# Patient Record
Sex: Female | Born: 1935 | Race: White | Hispanic: No | Smoking: Former smoker
Health system: Southern US, Community
[De-identification: ages and names within clinical notes are randomized; demographics above are authoritative.]

## PROBLEM LIST (undated history)

## (undated) DIAGNOSIS — E785 Hyperlipidemia, unspecified: Secondary | ICD-10-CM

## (undated) DIAGNOSIS — I471 Supraventricular tachycardia, unspecified: Secondary | ICD-10-CM

## (undated) DIAGNOSIS — K219 Gastro-esophageal reflux disease without esophagitis: Secondary | ICD-10-CM

## (undated) DIAGNOSIS — I10 Essential (primary) hypertension: Secondary | ICD-10-CM

## (undated) DIAGNOSIS — I499 Cardiac arrhythmia, unspecified: Secondary | ICD-10-CM

## (undated) DIAGNOSIS — I4891 Unspecified atrial fibrillation: Secondary | ICD-10-CM

## (undated) DIAGNOSIS — H269 Unspecified cataract: Secondary | ICD-10-CM

## (undated) DIAGNOSIS — M542 Cervicalgia: Secondary | ICD-10-CM

## (undated) DIAGNOSIS — T7840XA Allergy, unspecified, initial encounter: Secondary | ICD-10-CM

## (undated) DIAGNOSIS — I480 Paroxysmal atrial fibrillation: Secondary | ICD-10-CM

## (undated) DIAGNOSIS — I200 Unstable angina: Secondary | ICD-10-CM

## (undated) DIAGNOSIS — K222 Esophageal obstruction: Secondary | ICD-10-CM

## (undated) HISTORY — DX: Unspecified cataract: H26.9

## (undated) HISTORY — DX: Hyperlipidemia, unspecified: E78.5

## (undated) HISTORY — DX: Allergy, unspecified, initial encounter: T78.40XA

## (undated) HISTORY — DX: Essential (primary) hypertension: I10

## (undated) HISTORY — PX: APPENDECTOMY: SHX54

## (undated) HISTORY — DX: Esophageal obstruction: K22.2

## (undated) HISTORY — PX: ABDOMINAL HYSTERECTOMY: SHX81

## (undated) HISTORY — PX: BREAST EXCISIONAL BIOPSY: SUR124

## (undated) HISTORY — DX: Cervicalgia: M54.2

## (undated) HISTORY — DX: Supraventricular tachycardia: I47.1

## (undated) HISTORY — PX: BLADDER SURGERY: SHX569

## (undated) HISTORY — DX: Paroxysmal atrial fibrillation: I48.0

## (undated) HISTORY — DX: Supraventricular tachycardia, unspecified: I47.10

## (undated) HISTORY — DX: Unstable angina: I20.0

## (undated) HISTORY — DX: Unspecified atrial fibrillation: I48.91

---

## 1998-03-26 ENCOUNTER — Ambulatory Visit (HOSPITAL_COMMUNITY): Admission: RE | Admit: 1998-03-26 | Discharge: 1998-03-26 | Payer: Self-pay | Admitting: Infectious Diseases

## 1999-12-07 ENCOUNTER — Other Ambulatory Visit: Admission: RE | Admit: 1999-12-07 | Discharge: 1999-12-07 | Payer: Self-pay

## 2000-04-14 ENCOUNTER — Encounter: Payer: Self-pay | Admitting: Internal Medicine

## 2000-04-14 ENCOUNTER — Encounter: Admission: RE | Admit: 2000-04-14 | Discharge: 2000-04-14 | Payer: Self-pay | Admitting: Internal Medicine

## 2001-06-05 ENCOUNTER — Encounter: Payer: Self-pay | Admitting: Internal Medicine

## 2001-06-05 ENCOUNTER — Encounter: Admission: RE | Admit: 2001-06-05 | Discharge: 2001-06-05 | Payer: Self-pay | Admitting: Internal Medicine

## 2001-06-12 ENCOUNTER — Encounter: Admission: RE | Admit: 2001-06-12 | Discharge: 2001-06-12 | Payer: Self-pay | Admitting: Internal Medicine

## 2001-06-12 ENCOUNTER — Encounter: Payer: Self-pay | Admitting: Internal Medicine

## 2002-06-13 ENCOUNTER — Encounter: Payer: Self-pay | Admitting: Internal Medicine

## 2002-06-13 ENCOUNTER — Encounter: Admission: RE | Admit: 2002-06-13 | Discharge: 2002-06-13 | Payer: Self-pay | Admitting: Internal Medicine

## 2003-05-14 ENCOUNTER — Encounter: Admission: RE | Admit: 2003-05-14 | Discharge: 2003-05-14 | Payer: Self-pay | Admitting: General Surgery

## 2003-05-14 ENCOUNTER — Encounter: Payer: Self-pay | Admitting: General Surgery

## 2003-05-16 ENCOUNTER — Ambulatory Visit (HOSPITAL_BASED_OUTPATIENT_CLINIC_OR_DEPARTMENT_OTHER): Admission: RE | Admit: 2003-05-16 | Discharge: 2003-05-16 | Payer: Self-pay | Admitting: General Surgery

## 2003-05-16 ENCOUNTER — Encounter (INDEPENDENT_AMBULATORY_CARE_PROVIDER_SITE_OTHER): Payer: Self-pay | Admitting: *Deleted

## 2003-06-16 ENCOUNTER — Encounter: Admission: RE | Admit: 2003-06-16 | Discharge: 2003-06-16 | Payer: Self-pay | Admitting: Internal Medicine

## 2003-06-16 ENCOUNTER — Encounter: Payer: Self-pay | Admitting: Internal Medicine

## 2004-02-13 ENCOUNTER — Encounter: Admission: RE | Admit: 2004-02-13 | Discharge: 2004-02-13 | Payer: Self-pay | Admitting: Internal Medicine

## 2004-06-26 ENCOUNTER — Inpatient Hospital Stay (HOSPITAL_COMMUNITY): Admission: EM | Admit: 2004-06-26 | Discharge: 2004-06-30 | Payer: Self-pay | Admitting: Emergency Medicine

## 2004-06-28 ENCOUNTER — Encounter (INDEPENDENT_AMBULATORY_CARE_PROVIDER_SITE_OTHER): Payer: Self-pay | Admitting: *Deleted

## 2004-06-28 HISTORY — PX: CARDIAC CATHETERIZATION: SHX172

## 2004-07-26 ENCOUNTER — Encounter: Admission: RE | Admit: 2004-07-26 | Discharge: 2004-07-26 | Payer: Self-pay | Admitting: Internal Medicine

## 2004-10-26 ENCOUNTER — Ambulatory Visit: Payer: Self-pay | Admitting: Internal Medicine

## 2004-11-24 ENCOUNTER — Ambulatory Visit: Payer: Self-pay | Admitting: Internal Medicine

## 2004-12-06 ENCOUNTER — Ambulatory Visit: Payer: Self-pay | Admitting: Internal Medicine

## 2005-10-04 ENCOUNTER — Encounter: Admission: RE | Admit: 2005-10-04 | Discharge: 2005-10-04 | Payer: Self-pay | Admitting: Internal Medicine

## 2005-10-24 ENCOUNTER — Encounter: Admission: RE | Admit: 2005-10-24 | Discharge: 2005-10-24 | Payer: Self-pay | Admitting: Internal Medicine

## 2006-09-26 HISTORY — PX: CATARACT EXTRACTION, BILATERAL: SHX1313

## 2006-10-31 ENCOUNTER — Encounter: Admission: RE | Admit: 2006-10-31 | Discharge: 2006-10-31 | Payer: Self-pay | Admitting: Internal Medicine

## 2007-03-13 ENCOUNTER — Encounter: Admission: RE | Admit: 2007-03-13 | Discharge: 2007-03-13 | Payer: Self-pay | Admitting: Internal Medicine

## 2007-09-27 HISTORY — PX: TRANSTHORACIC ECHOCARDIOGRAM: SHX275

## 2007-11-12 ENCOUNTER — Encounter: Admission: RE | Admit: 2007-11-12 | Discharge: 2007-11-12 | Payer: Self-pay | Admitting: Internal Medicine

## 2009-11-10 ENCOUNTER — Encounter (INDEPENDENT_AMBULATORY_CARE_PROVIDER_SITE_OTHER): Payer: Self-pay | Admitting: *Deleted

## 2010-07-06 ENCOUNTER — Telehealth: Payer: Self-pay | Admitting: Internal Medicine

## 2010-10-17 ENCOUNTER — Encounter: Payer: Self-pay | Admitting: Internal Medicine

## 2010-10-26 NOTE — Letter (Signed)
Summary: Colonoscopy Letter  [facility]  [address] [place], [place] [address]   Phone: [phone]  Fax: [phone]      November 10, 2009 MRN: [id number]   [facility] [address] [place], [place]  [address]   Dear Ms. Rayle,   According to your medical record, it is time for you to schedule a Colonoscopy. The American Cancer Society recommends this procedure as a method to detect early colon cancer. Patients with a family history of colon cancer, or a personal history of colon polyps or inflammatory bowel disease are at increased risk.  This letter has beeen generated based on the recommendations made at the time of your procedure. If you feel that in your particular situation this may no longer apply, please contact our office.  Please call our office at [phone] to schedule this appointment or to update your records at your earliest convenience.  Thank you for cooperating with [place] to provide you with the very best care possible.   Sincerely,  Wilhemina Bonito. Marina Goodell, M.D.  [facility] Gastroenterology Division [phone]

## 2010-10-26 NOTE — Progress Notes (Signed)
Summary: Schedule Colonoscopy  Phone Note Outgoing Call Call back at Home Phone [phone]   Call placed by: Harlow Mares CMA Duncan Dull),  July 06, 2010 3:36 PM Call placed to: Patient Summary of Call: patient due for a recall colonoscopy, i have Left a message on patients machine to call back.  Initial call taken by: Harlow Mares CMA Duncan Dull),  July 06, 2010 3:36 PM  Follow-up for Phone Call        PT CALLED AND SAID SHE WILL C/B TO [facility] COL.Marland KitchenMarland KitchenNOT READY TO [facility] RIGHT NOW Follow-up by: Revonda Standard, [facility]

## 2011-02-11 NOTE — Discharge Summary (Signed)
NAMEKRISY, Laurie Pugh             ACCOUNT NO.:  [id number]   MEDICAL RECORD NO.:  [id number]          PATIENT TYPE:  INP   LOCATION:  4713                         FACILITY:  [facility]   PHYSICIAN:  Dani Gobble, MD       DATE OF BIRTH:  [birth year]   DATE OF ADMISSION:  06/26/2004  DATE OF DISCHARGE:  06/30/2004                                 DISCHARGE SUMMARY   ADMISSION DIAGNOSES:  1.  Supraventricular tachycardia/atrial fibrillation.  2.  Hyperlipidemia.  3.  Tobacco use.   DISCHARGE DIAGNOSES:  1.  Supraventricular tachycardia/atrial fibrillation.  2.  Hyperlipidemia.  3.  Tobacco use.   PROCEDURE:  Cardiac catheterization, June 28, 2004.   BRIEF HISTORY AND PHYSICAL:  The patient is a 77 year old white female, a  medical patient of Dr. Waynard Edwards with a history of tobacco use and a  hyperlipidemia, who was admitted on June 26, 2004 with atrial  fibrillation.  She presented to [facility] with SVT and atrial  fibrillation. She woke up around 5 a.m. the day prior felt a rapid heart  rate that lasted until 7:15 a.m.  It resolved apparently, and she had  another episode the a.m. of June 26, 2004.  She went to Urgent Care who  subsequently sent her to the emergency room at [facility].  She had  a similar episode of heart rate occurring approximately 6-8 months ago and  lasted approximately one hour.  Upon presentation to Urgent Care, she had  SVT with atrial fibrillation.  On arrival to the ED here at [facility], she  converted to a sinus rhythm with heart rate of 83.  She was ruled out for a  myocardial infarction.  She had no further palpitations, underwent cardiac  catheterization on June 28, 2004.  This showed normal coronaries.  There  was no disease.  She had an ejection fraction of 70%.  A 2-D echocardiogram  obtained on June 28, 2004 showed a normal LV systolic function with an EF  between 55 and 65%.  Left ventricular wall thickness was mildly  decreased.  Aortic valve was mildly increased. There was mild mitral calcification and  mild mitral valvular regurgitation.  The patient was subsequently placed on  Coumadin and Toprol.  She developed some bradycardia on Toprol-XL 25 mg  daily, and this was held on October 4, and restarted on June 30, 2004 at  12.5 mg per day.  The patient will be discharged home on Coumadin 3 mg  daily.  Her pro time today is 19.6 with an INR of 2.  She received three  doses of Coumadin on October 2, 3, and 4, 2005, each was 5 mg.   Will plan to have her follow up with Dr. Jenne Campus in two weeks.  Will also  arrange for her to have her Coumadin followed up in Dr. Laurey Morale office.   DISCHARGE ACTIVITIES:  Light to moderate as tolerated, no lifting over 10  pounds, no driving, no strenuous activity.  She is instructed to discontinue  smoking.  She can drive after 72 hours.   DISCHARGE MEDICINES:  1.  Aspirin 81 mg daily.  2.  Wellbutrin 150 mg daily.  3.  Toprol-XL 25 mg 1/2 tablet daily.  4.  Crestor 10 mg daily.  5.  Coumadin 2 mg 1-1/2 tablets daily or as directed.   CONDITION ON DISCHARGE:  Improved.       WDJ/MEDQ  D:  06/30/2004  T:  06/30/2004  Job:  [id number]   cc:   Loraine Leriche A. Waynard Edwards, M.D.  [address]  [place]  [place] [address]  Fax: [phone]

## 2011-02-11 NOTE — Cardiovascular Report (Signed)
Laurie Pugh, Laurie Pugh             ACCOUNT NO.:  [id number]   MEDICAL RECORD NO.:  [id number]          PATIENT TYPE:  INP   LOCATION:  4713                         FACILITY:  [facility]   PHYSICIAN:  Darlin Priestly, M.D.DATE OF BIRTH:  Feb 22, 1932   DATE OF PROCEDURE:  06/28/2004  DATE OF DISCHARGE:                              CARDIAC CATHETERIZATION   PROCEDURE:  1.  Left heart catheterization.  2.  Coronary angiography.  3.  Left ventriculogram.   ATTENDING:  Dr. Lenise Herald.   COMPLICATIONS:  None.   INDICATIONS:  Ms. Graff is a 77 year old female patient with Dr. Waynard Edwards with  a history of tobacco use, hyperlipidemia who was admitted on June 26, 2004, with afib.  She is now referred for cardiac catheterization to rule  out significant CAD.   DESCRIPTION OF OPERATION:  After giving informed consent, the patient was  brought to the cardiac catheterization lab, right and left groin were  shaved, prepped and draped in the usual sterile fashion.  ECG monitor  established using the modified Seldinger technique.  A #6 French arterial  sheath inserted in the right femoral artery.  The 6-French diagnostic  catheter was used to perform diagnostic angiography.   Left main is a large vessel with no significant disease.   The LAD is a large vessel, courses the apex with two diagonal branches.  The  LAD has no significant disease.   The first diagonal is a small vessel with no significant disease.   The second diagonal is a large vessel which bifurcates distally with no  significant disease.   The left circumflex is a small vessel which gave rise to two small obtuse  marginals.  There is no significant disease in the AV groove circ or either  one of the obtuse marginals.  There is no significant disease in the AV  groove circ or either one of the obtuse marginals.   The right coronary artery is a large vessel which is dominant and gives rise  to both PDA as well as  posterolateral branch.  There is no significant  disease in the RCA, PDA or posterolateral branch.   Left ventriculogram is 70%.   HEMODYNAMICS:  Systemic arterial pressure 136/64, LV systemic pressure  131/1, LVEDP of 15.   CONCLUSION:  1.  No significant coronary artery disease.  2.  Normal left ventricular systolic function.       RHM/MEDQ  D:  06/28/2004  T:  06/28/2004  Job:  [id number]   cc:   Loraine Leriche A. Waynard Edwards, M.D.  [address]  [place]  [place] [address]  Fax: [phone]

## 2011-02-11 NOTE — Op Note (Signed)
NAMEWYNTER, Laurie Pugh                       ACCOUNT NO.:  [id number]   MEDICAL RECORD NO.:  [id number]                   PATIENT TYPE:  AMB   LOCATION:  DSC                                  FACILITY:  [facility]   PHYSICIAN:  Anselm Pancoast. Zachery Dakins, M.D.          DATE OF BIRTH:  [birth year]   DATE OF PROCEDURE:  05/16/2003  DATE OF DISCHARGE:                                 OPERATIVE REPORT   PREOPERATIVE DIAGNOSES:  1. Previous either drainage of a perirectal abscess or sebaceous cyst.  2. 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   PROCEDURE:  Examination under anesthesia and excision of recurrent fistulous  tract, posterior anus.   General anesthesia.   HISTORY:  Laurie Pugh is a 77 year old female who presented to our  office about a year and a half ago and was seen by Maisie Fus B. Price, M.D.,  for a posterior perirectal abscess.  This was drained and there was a small  opening that has persisted ever since, about 1 x 2 cm in size, and on  numerous examinations Dr. Samuella Cota has never been able to demonstrate a fistula  communication, but this area has kind of persisted with a little  indentation, a tender area.  She says intermittently it would kind of become  more tender, a spot or two of drainage, and then would kind of get back to  the chronic stage.  I saw her approximately three weeks ago.  There was no  evidence of any obvious inflammation but with the smallest probe, you could  see a little area kind of going toward the anal area but I could only get  the probe to communicate about a centimeter in length.  I wonder if this is  a posterior perirectal abscess and recommended that one, we examine her  under general anesthesia and either plan on doing a fistulectomy if a  fistula was definitely identified, or excision of this kind of chronic tract  going toward the anus if there is no actual communication noted.  The  patient is in agreement and is for the planned procedure.   The patient was  given a 1 g of Kefzol immediately preoperatively and taken  to the operative suite, general anesthesia with LOA tube, and then placed up  in the lithotomy position.  Within this patient, the patient is quite thin  and you can easily see this little external area, and with the lacrimal duct  probe I could go in about a centimeter toward the anus but not able to get  anything that actually goes truly into the anal canal.  With an anoscope,  posteriorly you cannot actually feel any little indentation, etc.  We then  prepped her with solution, Betadine, and I basically ellipsed out the old  chronic area and then very carefully dissected with loupes for magnification  and followed this area so that the chronic fibrosis, this little area that  goes about a  communication with a probe in it, was definitely excised.  Then  it sort of gets down to, it looks like the normal perianal sphincter area,  and I elected not to continue going on into the anus but actually just  carefully transected it with the magnification and at no time could I  actually demonstrate a little communication or a little tubular structure  going to the dentate line posteriorly.  At this area I then used a  lubricated glove to feel in the anus, and I see no evidence of a  communication and there is still another good 0.5 cm of normal perianal  muscular structures, and I think that if I will kind of loosely approximate  this to kind of pull in the subcutaneous tissue that was done with two  sutures of 3-0 chromic, it will kind of give her a little more bulk in this  area since she is so thin and she kind of sits on this area.  This was done.  I had changed gloves after I had put my finger in her anus, and hopefully,  even though it is in close proximity, never did any kind of contamination.  I then placed three stitches, two a mattress and one a simple stitch, of 4-0  nylon on the skin, and I will put antibiotic ointment on the  area and a  light dressing trying to separate the anus from this little area and then  have her remove the dressing and start showering in the a.m.  I will see her  back in the office early next week.  If we have any evidence of any  inflammation, I will remove the skin sutures.  Otherwise, if it is healing  without evidence of inflammation, we will remove the stitches in  approximately a week.                                                Anselm Pancoast. Zachery Dakins, M.D.    WJW/MEDQ  D:  05/16/2003  T:  05/17/2003  Job:  [id number]

## 2012-10-23 ENCOUNTER — Other Ambulatory Visit: Payer: Self-pay | Admitting: Internal Medicine

## 2012-10-23 DIAGNOSIS — Z1231 Encounter for screening mammogram for malignant neoplasm of breast: Secondary | ICD-10-CM

## 2012-11-26 ENCOUNTER — Ambulatory Visit: Payer: Self-pay

## 2012-11-26 ENCOUNTER — Ambulatory Visit
Admission: RE | Admit: 2012-11-26 | Discharge: 2012-11-26 | Disposition: A | Discharge status: Home / Self Care | Payer: Medicare Other | Source: Ambulatory Visit | Attending: Internal Medicine | Admitting: Internal Medicine

## 2012-11-26 DIAGNOSIS — Z1231 Encounter for screening mammogram for malignant neoplasm of breast: Secondary | ICD-10-CM

## 2013-06-24 ENCOUNTER — Ambulatory Visit (INDEPENDENT_AMBULATORY_CARE_PROVIDER_SITE_OTHER): Payer: Medicare Other | Admitting: Internal Medicine

## 2013-06-24 ENCOUNTER — Encounter: Payer: Self-pay | Admitting: Internal Medicine

## 2013-06-24 VITALS — BP 140/82 | HR 54 | Ht 63.0 in | Wt 139.6 lb

## 2013-06-24 DIAGNOSIS — E785 Hyperlipidemia, unspecified: Secondary | ICD-10-CM

## 2013-06-24 DIAGNOSIS — I4891 Unspecified atrial fibrillation: Secondary | ICD-10-CM

## 2013-06-24 DIAGNOSIS — R079 Chest pain, unspecified: Secondary | ICD-10-CM

## 2013-06-24 DIAGNOSIS — I200 Unstable angina: Secondary | ICD-10-CM

## 2013-06-24 DIAGNOSIS — E782 Mixed hyperlipidemia: Secondary | ICD-10-CM | POA: Insufficient documentation

## 2013-06-24 DIAGNOSIS — R0600 Dyspnea, unspecified: Secondary | ICD-10-CM

## 2013-06-24 DIAGNOSIS — R002 Palpitations: Secondary | ICD-10-CM

## 2013-06-24 DIAGNOSIS — R0609 Other forms of dyspnea: Secondary | ICD-10-CM | POA: Insufficient documentation

## 2013-06-24 DIAGNOSIS — Z8679 Personal history of other diseases of the circulatory system: Secondary | ICD-10-CM

## 2013-06-24 DIAGNOSIS — M542 Cervicalgia: Secondary | ICD-10-CM

## 2013-06-24 DIAGNOSIS — I10 Essential (primary) hypertension: Secondary | ICD-10-CM

## 2013-06-24 NOTE — Patient Instructions (Addendum)
Your physician has recommended that you wear an event monitor. Event monitors are medical devices that record the heart's electrical activity. Doctors most often [place] these monitors to diagnose arrhythmias. Arrhythmias are problems with the speed or rhythm of the heartbeat. The monitor is a small, portable device. You can wear one while you do your normal daily activities. This is usually used to diagnose what is causing palpitations/syncope (passing out). You will wear this for 2 weeks.  Your physician has requested that you have a lexiscan myoview. For further information please visit [email or web address] Please follow instruction sheet, as given.  Please schedule a follow up visit in about 3-4 weeks - after your stress test and after you wear your monitor.

## 2013-06-24 NOTE — Progress Notes (Signed)
OFFICE NOTE  Chief Complaint:  Palpitations, atrial fibrillation, dyspnea on exertion, neck/chest pain, diaphoresis  Primary Care Physician: Laurie Kayser, MD  HPI:  Laurie Pugh is a pleasant 80 year old female with a history of smoking for 40 years, one pack per day. She quit in 2005 when she underwent heart catheterization. This demonstrated no significant coronary disease, despite significant risk factors. Her family history is significant for mother who had an MI and died at age 55 and her sister who had bypass surgery and died at age 68. Just as a history of atrial fibrillation and this is been ongoing for years. She says she is having symptoms paroxysmal 8 in about 2 weeks ago she noted her heart flipping and flopping out of rhythm. This episode was associated with neck pain which is new diaphoresis and some discomfort as well as shortness of breath. She is concerned about the new symptoms as they may be coronary equivalents. She is on medication for hypertension and dyslipidemia as well as warfarin which he takes for atrial fibrillation.   PMHx:  Past Medical History  Diagnosis Date  . Atrial fibrillation     History reviewed. No pertinent past surgical history.  FAMHx:  Family History  Problem Relation Age of Onset  . Heart disease Mother   . Heart disease Sister     SOCHx:   reports that she quit smoking about 10 years ago. She has never used smokeless tobacco. She reports that she does not drink alcohol or use illicit drugs.  ALLERGIES:  Allergies  Allergen Reactions  . Other     Mycins - cannot recall whether azithromycin, e-mycin    ROS: A comprehensive review of systems was negative except for: Respiratory: positive for dyspnea on exertion Cardiovascular: positive for chest pain, irregular heart beat and palpitations  HOME MEDS: Current Outpatient Prescriptions  Medication Sig Dispense Refill  . benazepril (LOTENSIN) 20 MG tablet Take 1 tablet by  mouth daily.      . Calcium Carbonate-Vitamin D (CALTRATE 600+D) 600-400 MG-UNIT per chew tablet Chew 2 tablets by mouth daily.      . chlorthalidone (HYGROTON) 25 MG tablet Take 12.5 mg by mouth daily as needed.      . diltiazem (DILACOR XR) 120 MG 24 hr capsule Take 1 capsule by mouth daily.      Marland Kitchen LORazepam (ATIVAN) 1 MG tablet Take 0.5-1 tablets by mouth daily.      . rosuvastatin (CRESTOR) 10 MG tablet Take 10 mg by mouth daily.      Marland Kitchen warfarin (COUMADIN) 2 MG tablet Take by mouth daily. Per INR       No current facility-administered medications for this visit.    LABS/IMAGING: No results found for this or any previous visit (from the past 48 hour(s)). No results found.  VITALS: BP 140/82  Pulse 54  Ht 5\' 3"  (1.6 m)  Wt 139 lb 9.6 oz (63.322 kg)  BMI 24.74 kg/m2  EXAM: General appearance: alert and no distress Neck: no adenopathy, no carotid bruit, no JVD, supple, symmetrical, trachea midline and thyroid not enlarged, symmetric, no tenderness/mass/nodules Lungs: faint crackles in the right base Heart: regular rate and rhythm, S1, S2 normal, no murmur, click, rub or gallop Abdomen: soft, non-tender; bowel sounds normal; no masses,  no organomegaly Extremities: extremities normal, atraumatic, no cyanosis or edema Pulses: 2+ and symmetric Skin: Skin color, texture, turgor normal. No rashes or lesions Neurologic: Grossly normal  EKG: Sinus bradycardia 54, no ischemic changes  ASSESSMENT: 1. Paroxysmal atrial fibrillation - now in sinus 2. Chest/neck discomfort with diaphoresis - concerning for unstable angina 3. Dyspnea and exertion 4. Hypertension - generally well controlled 5. Dyslipidemia - on statin 6. Significant past smoking history - quit in 2005 7. Family history of coronary disease  PLAN: 1.   Laurie Pugh has been having episodes of presumed paroxysmal atrial fibrillation, however other arrhythmias cannot be excluded. The episodes seem to happen every few weeks  but recently been associated with neck/chest discomfort, diaphoresis, nausea and other associated symptoms. This could be a manifestation of coronary disease in the setting of her PAF, and I did given her past medical history which is significant in the fact that she has had no cardiac evaluation in the past 10 years, she should undergo LexiScan nuclear stress testing. In addition I would like to arrange for a two-week CardioNet monitor to see if we can capture some of the episodes of palpitation that she is having. Plan to see her back in a few weeks to discuss those results.  Thanks as always for the referral.  Chrystie Nose, MD, [facility] Attending Cardiologist [facility]  Laurie Pugh C 06/24/2013, 10:49 AM

## 2013-07-02 ENCOUNTER — Encounter: Payer: Self-pay | Admitting: Internal Medicine

## 2013-07-02 ENCOUNTER — Ambulatory Visit (HOSPITAL_COMMUNITY)
Admission: RE | Admit: 2013-07-02 | Discharge: 2013-07-02 | Disposition: A | Discharge status: Home / Self Care | Payer: Medicare Other | Source: Ambulatory Visit | Attending: Cardiovascular Disease | Admitting: Cardiovascular Disease

## 2013-07-02 DIAGNOSIS — R0600 Dyspnea, unspecified: Secondary | ICD-10-CM

## 2013-07-02 DIAGNOSIS — R0989 Other specified symptoms and signs involving the circulatory and respiratory systems: Secondary | ICD-10-CM | POA: Insufficient documentation

## 2013-07-02 DIAGNOSIS — R002 Palpitations: Secondary | ICD-10-CM | POA: Insufficient documentation

## 2013-07-02 DIAGNOSIS — I4891 Unspecified atrial fibrillation: Secondary | ICD-10-CM | POA: Insufficient documentation

## 2013-07-02 DIAGNOSIS — R0609 Other forms of dyspnea: Secondary | ICD-10-CM | POA: Insufficient documentation

## 2013-07-02 DIAGNOSIS — R079 Chest pain, unspecified: Secondary | ICD-10-CM | POA: Insufficient documentation

## 2013-07-02 DIAGNOSIS — Z8679 Personal history of other diseases of the circulatory system: Secondary | ICD-10-CM

## 2013-07-02 MED ORDER — TECHNETIUM TC 99M SESTAMIBI GENERIC - CARDIOLITE
31.0000 | Freq: Once | INTRAVENOUS | Status: AC | PRN
  Administered 2013-07-02: 31 via INTRAVENOUS

## 2013-07-02 MED ORDER — TECHNETIUM TC 99M SESTAMIBI GENERIC - CARDIOLITE
10.3000 | Freq: Once | INTRAVENOUS | Status: AC | PRN
  Administered 2013-07-02: 10 via INTRAVENOUS

## 2013-07-02 MED ORDER — REGADENOSON 0.4 MG/5ML IV SOLN
0.4000 mg | Freq: Once | INTRAVENOUS | Status: AC
  Administered 2013-07-02: 0.4 mg via INTRAVENOUS

## 2013-07-02 NOTE — Procedures (Addendum)
[facility] [address] [place] [address] [phone]  Cardiology Nuclear Med Study  Laurie Pugh is a 79 y.o. female     MRN : [id number]     DOB: Jan 13, 1932  Procedure Date: 07/02/2013  Nuclear Med Background Indication for Stress Test:  Evaluation for Ischemia and Abnormal EKG History:  PAF; Cardiac Risk Factors: Family History - CAD, History of Smoking, Hypertension and Lipids  Symptoms:  Chest Pain, Diaphoresis, Dizziness, DOE, Fatigue, Light-Headedness, Nausea and Palpitations   Nuclear Pre-Procedure Caffeine/Decaff Intake:  7:00pm NPO After: 5:00am   IV Site: R Hand  IV 0.9% NS with Angio Cath:  24g  Chest Size (in):  N/A IV Started by: Emmit Pomfret, RN  Height: 5\' 3"  (1.6 m)  Cup Size: C  BMI:  Body mass index is 24.63 kg/(m^2). Weight:  139 lb (63.05 kg)   Tech Comments:  N/A    Nuclear Med Study 1 or 2 day study: 1 day  Stress Test Type:  Lexiscan  Order Authorizing Provider:  Zoila Shutter, Md   Resting Radionuclide: Technetium 28m Sestamibi  Resting Radionuclide Dose: 10.3 mCi   Stress Radionuclide:  Technetium 40m Sestamibi  Stress Radionuclide Dose: 31.0 mCi           Stress Protocol Rest HR: 63 Stress HR: 105  Rest BP:177/84 Stress BP: 226/42  Exercise Time (min): n/a METS: n/a          Dose of Adenosine (mg):  n/a Dose of Lexiscan: 0.4 mg  Dose of Atropine (mg): n/a Dose of Dobutamine: n/a mcg/kg/min (at max HR)  Stress Test Technologist: Ernestene Mention, CCT Nuclear Technologist: Gonzella Lex, CNMT   Rest Procedure:  Myocardial perfusion imaging was performed at rest 45 minutes following the intravenous administration of Technetium 25m Sestamibi. Stress Procedure:  The patient received IV Lexiscan 0.4 mg over 15-seconds.  Technetium 24m Sestamibi injected at 30-seconds.  There were no significant changes with Lexiscan.  Quantitative spect images were obtained after a 45 minute  delay.  Transient Ischemic Dilatation (Normal <1.22):  0.95 Lung/Heart Ratio (Normal <0.45):  0.28 QGS EDV:  43 ml QGS ESV:  4 ml LV Ejection Fraction: 90%     Rest ECG: NSR - Normal EKG. Very Tall T waves inferiorly  Stress ECG: No significant change from baseline ECG  QPS Raw Data Images:  There is a breast shadow that accounts for the anterior attenuation. Stress Images:  There is decreased uptake in the anterior wall. Rest Images:  There is decreased uptake in the anterior wall. Subtraction (SDS):  There is a fixed anterior defect that is most consistent with breast attenuation. LV Wall Motion:  Normal LV function; Normal wall motion  Impression Exercise Capacity:  Lexiscan with no exercise. BP Response:  Normal blood pressure response. Clinical Symptoms:  No significant symptoms noted. ECG Impression:  No significant ECG changes with Lexiscan. Comparison with Prior Nuclear Study: No previous nuclear study performed   Overall Impression:  Low risk stress nuclear study with breast attenuation artifact.Thurmon Fair, MD  07/02/2013 1:17 PM

## 2013-07-08 ENCOUNTER — Telehealth: Payer: Self-pay | Admitting: Cardiovascular Disease

## 2013-07-08 NOTE — Telephone Encounter (Signed)
Returned patient's call regarding monitor and when to mail back. Also provided stress test results and reminded of OV 07/16/13 with Dr. Rennis Golden to follow up on test and monitor. Patient verbalized understanding.

## 2013-07-08 NOTE — Telephone Encounter (Signed)
Has question about monitor

## 2013-07-16 ENCOUNTER — Ambulatory Visit (INDEPENDENT_AMBULATORY_CARE_PROVIDER_SITE_OTHER): Payer: Medicare Other | Admitting: Internal Medicine

## 2013-07-16 ENCOUNTER — Encounter: Payer: Self-pay | Admitting: Internal Medicine

## 2013-07-16 ENCOUNTER — Telehealth: Payer: Self-pay | Admitting: Internal Medicine

## 2013-07-16 VITALS — BP 130/70 | HR 80 | Ht 63.0 in | Wt 140.2 lb

## 2013-07-16 DIAGNOSIS — I4891 Unspecified atrial fibrillation: Secondary | ICD-10-CM

## 2013-07-16 DIAGNOSIS — I471 Supraventricular tachycardia: Secondary | ICD-10-CM

## 2013-07-16 MED ORDER — ATENOLOL 25 MG PO TABS: 25.0000 mg | ORAL_TABLET | ORAL | Status: DC

## 2013-07-16 MED ORDER — METOPROLOL SUCCINATE ER 25 MG PO TB24: 25.0000 mg | ORAL_TABLET | Freq: Every day | ORAL | Status: DC

## 2013-07-16 NOTE — Patient Instructions (Signed)
Follow up in 1 month   

## 2013-07-16 NOTE — Telephone Encounter (Signed)
Message forwarded to Dr. Hilty/Jenna, RN. 

## 2013-07-16 NOTE — Telephone Encounter (Signed)
Rx was sent to pharmacy electronically. Called patient to notify of medication changes per Dr. Rennis Golden r/t cost of Toprol XL 25mg  daily - changed to atenolol 25mg  every AM

## 2013-07-16 NOTE — Telephone Encounter (Signed)
Just left here-Dr [facility] gave her a prescription for a beta blocker.It is going to cost $30,she wants to know if there is one less expensive.

## 2013-07-16 NOTE — Progress Notes (Signed)
OFFICE NOTE  Chief Complaint:  Palpitations, atrial fibrillation, dyspnea on exertion, neck/chest pain, diaphoresis  Primary Care Physician: Ezequiel Kayser, MD  HPI:  Laurie Pugh is a pleasant 79 year old female with a history of smoking for 40 years, one pack per day. She quit in 2005 when she underwent heart catheterization. This demonstrated no significant coronary disease, despite significant risk factors. Her family history is significant for mother who had an MI and died at age 24 and her sister who had bypass surgery and died at age 60. Just as a history of atrial fibrillation and this is been ongoing for years. She says she is having symptoms paroxysmal 8 in about 2 weeks ago she noted her heart flipping and flopping out of rhythm. This episode was associated with neck pain which is new diaphoresis and some discomfort as well as shortness of breath. She is concerned about the new symptoms as they may be coronary equivalents. She is on medication for hypertension and dyslipidemia as well as warfarin which he takes for atrial fibrillation.   Based on her symptoms I recommended a LexiScan nuclear stress test. She did undergo this stress test on 07/02/2013. This was negative except for a small area of breast attenuation artifact. She was also placed on a CardioNet monitor which she wore between 06/24/2013 07/08/2013. There was 164 hours a total monitoring time. During that episode there were no rhythms that were self triggered. However the device did not medically trigger for heart rates in the 30s and 40s mostly at night with PVCs. Interestingly she had an episode on 07/04/2013 were she went into a abrupt onset narrow complex supraventricular tachycardia. This persisted for approximately 13 minutes and it was captured to abruptly terminate. She reports she was unaware of this episode. No atrial fibrillation was noted.   PMHx:  Past Medical History  Diagnosis Date  . Atrial  fibrillation     History reviewed. No pertinent past surgical history.  FAMHx:  Family History  Problem Relation Age of Onset  . Heart disease Mother   . Heart disease Sister     SOCHx:   reports that she quit smoking about 10 years ago. She has never used smokeless tobacco. She reports that she does not drink alcohol or use illicit drugs.  ALLERGIES:  Allergies  Allergen Reactions  . Other     Mycins - cannot recall whether azithromycin, e-mycin    ROS: A comprehensive review of systems was negative except for: Respiratory: positive for dyspnea on exertion Cardiovascular: positive for chest pain, irregular heart beat and palpitations  HOME MEDS: Current Outpatient Prescriptions  Medication Sig Dispense Refill  . benazepril (LOTENSIN) 20 MG tablet Take 1 tablet by mouth daily.      . Calcium Carbonate-Vitamin D (CALTRATE 600+D) 600-400 MG-UNIT per chew tablet Chew 2 tablets by mouth daily.      . chlorthalidone (HYGROTON) 25 MG tablet Take 12.5 mg by mouth daily as needed.      . diltiazem (DILACOR XR) 120 MG 24 hr capsule Take 1 capsule by mouth daily.      Marland Kitchen LORazepam (ATIVAN) 1 MG tablet Take 0.5-1 tablets by mouth daily.      . rosuvastatin (CRESTOR) 10 MG tablet Take 10 mg by mouth daily.      Marland Kitchen warfarin (COUMADIN) 2 MG tablet Take by mouth daily. Per INR      . metoprolol succinate (TOPROL-XL) 25 MG 24 hr tablet Take 1 tablet (25 mg total)  by mouth daily.  30 tablet  11   No current facility-administered medications for this visit.    LABS/IMAGING: No results found for this or any previous visit (from the past 48 hour(s)). No results found.  VITALS: BP 130/70  Pulse 80  Ht 5\' 3"  (1.6 m)  Wt 140 lb 3.2 oz (63.594 kg)  BMI 24.84 kg/m2  EXAM: deferred  EKG: deferred  ASSESSMENT: 1. Paroxysmal atrial fibrillation - none noted on monitor, however, PSVT was noted 2. Chest/neck discomfort with diaphoresis - negative NST in 06/2013 3. Dyspnea on  exertion 4. Hypertension - generally well controlled 5. Dyslipidemia - on statin 6. Significant past smoking history - quit in 2005 7. Family history of coronary disease  PLAN: 1.   Laurie Pugh had a nuclear stress test that was negative for ischemia.  Her monitor did not show atrial fibrillation however she did have an episode of ventricular tachycardia at a rate in the 160s that terminated abruptly after 13 minutes. She seemed to be unaware of this event. She also has PVCs. This is despite taking diltiazem. I discussed options with her for controlling these arrhythmias including titrating her diltiazem or considering the addition of a beta blocker. Said she broke through, I think that it would be wise to consider it adding an additional agent with a different mechanism that may also help suppress some of her PVCs. I recommend starting Toprol-XL 25 mg daily. Should not make a significant change in her blood pressure however if it runs somewhat low, we may need to reduce her Lotensin. We could also consider a higher dose beta blocker and discontinue Cardizem at some point as necessary. I like to see her back in one month to see she's had an improvement in her symptoms.  Thanks again as always for the referral.  Chrystie Nose, MD, [facility] Attending Cardiologist [facility]  HILTY,Kenneth C 07/16/2013, 8:24 AM

## 2013-07-22 ENCOUNTER — Other Ambulatory Visit: Payer: Self-pay | Admitting: *Deleted

## 2013-07-22 MED ORDER — ATENOLOL 25 MG PO TABS: 25.0000 mg | ORAL_TABLET | ORAL | Status: DC

## 2013-07-22 NOTE — Telephone Encounter (Signed)
Rx was sent to pharmacy electronically. 

## 2013-07-29 ENCOUNTER — Other Ambulatory Visit: Payer: Self-pay | Admitting: *Deleted

## 2013-08-19 ENCOUNTER — Ambulatory Visit (INDEPENDENT_AMBULATORY_CARE_PROVIDER_SITE_OTHER): Payer: Medicare Other | Admitting: Internal Medicine

## 2013-08-19 ENCOUNTER — Encounter: Payer: Self-pay | Admitting: Internal Medicine

## 2013-08-19 VITALS — BP 121/69 | HR 43 | Ht 63.0 in | Wt 141.7 lb

## 2013-08-19 DIAGNOSIS — I471 Supraventricular tachycardia: Secondary | ICD-10-CM

## 2013-08-19 DIAGNOSIS — I4891 Unspecified atrial fibrillation: Secondary | ICD-10-CM

## 2013-08-19 NOTE — Patient Instructions (Addendum)
STOP Diltiazem.  Your physician wants you to follow-up in: 6 months. You will receive a reminder letter in the mail two months in advance. If you don't receive a letter, please call our office to schedule the follow-up appointment.

## 2013-08-19 NOTE — Progress Notes (Signed)
OFFICE NOTE  Chief Complaint:  Palpitations, atrial fibrillation, dyspnea on exertion, neck/chest pain, diaphoresis  Primary Care Physician: Ezequiel Kayser, MD  HPI:  Laurie Pugh is a pleasant 79 year old female with a history of smoking for 40 years, one pack per day. She quit in 2005 when she underwent heart catheterization. This demonstrated no significant coronary disease, despite significant risk factors. Her family history is significant for mother who had an MI and died at age 80 and her sister who had bypass surgery and died at age 63. Just as a history of atrial fibrillation and this is been ongoing for years. She says she is having symptoms paroxysmal 8 in about 2 weeks ago she noted her heart flipping and flopping out of rhythm. This episode was associated with neck pain which is new diaphoresis and some discomfort as well as shortness of breath. She is concerned about the new symptoms as they may be coronary equivalents. She is on medication for hypertension and dyslipidemia as well as warfarin which he takes for atrial fibrillation.   Based on her symptoms I recommended a LexiScan nuclear stress test. She did undergo this stress test on 07/02/2013. This was negative except for a small area of breast attenuation artifact. She was also placed on a CardioNet monitor which she wore between 06/24/2013 07/08/2013. There was 164 hours a total monitoring time. During that episode there were no rhythms that were self triggered. However the device did not medically trigger for heart rates in the 30s and 40s mostly at night with PVCs. Interestingly she had an episode on 07/04/2013 were she went into a abrupt onset narrow complex supraventricular tachycardia. This persisted for approximately 13 minutes and it was captured to abruptly terminate. She reports she was unaware of this episode. No atrial fibrillation was noted.  Because of her continuing palpitations, I recommended starting beta  blocker at her last visit.  Due to cost we switched her to atenolol 25 mg daily, and she noted after taking the first pill that she no longer had any further palpitations. She reports that she has suffered with palpitations for over 10 years and is so pleased that they've actually stopped.  It is noted however that her heart rate is much lower today in the 40's.  She occasionally gets some positional dizziness.  PMHx:  Past Medical History  Diagnosis Date  . Atrial fibrillation     Past Surgical History  Procedure Laterality Date  . Abdominal hysterectomy    . Appendectomy    . Bladder surgery      bladder surgery  . Cardiac catheterization  2005    FAMHx:  Family History  Problem Relation Age of Onset  . Heart disease Mother   . Heart disease Sister     SOCHx:   reports that she quit smoking about 10 years ago. She has never used smokeless tobacco. She reports that she does not drink alcohol or use illicit drugs.  ALLERGIES:  Allergies  Allergen Reactions  . Other     Mycins - cannot recall whether azithromycin, e-mycin    ROS: A comprehensive review of systems was negative except for: Neurological: positive for dizziness  HOME MEDS: Current Outpatient Prescriptions  Medication Sig Dispense Refill  . atenolol (TENORMIN) 25 MG tablet Take 1 tablet (25 mg total) by mouth every morning.  90 tablet  3  . benazepril (LOTENSIN) 20 MG tablet Take 1 tablet by mouth daily.      Marland Kitchen  Calcium Carbonate-Vitamin D (CALTRATE 600+D) 600-400 MG-UNIT per chew tablet Chew 2 tablets by mouth daily.      . chlorthalidone (HYGROTON) 25 MG tablet Take 12.5 mg by mouth daily as needed.      . diltiazem (DILACOR XR) 120 MG 24 hr capsule Take 1 capsule by mouth daily.      Marland Kitchen LORazepam (ATIVAN) 1 MG tablet Take 0.5-1 tablets by mouth daily.      . rosuvastatin (CRESTOR) 10 MG tablet Take 10 mg by mouth daily.      Marland Kitchen warfarin (COUMADIN) 2 MG tablet Take by mouth daily. Per INR       No current  facility-administered medications for this visit.    LABS/IMAGING: No results found for this or any previous visit (from the past 48 hour(s)). No results found.  VITALS: BP 121/69  Pulse 43  Ht 5\' 3"  (1.6 m)  Wt 141 lb 11.2 oz (64.275 kg)  BMI 25.11 kg/m2  EXAM: deferred  EKG: deferred  ASSESSMENT: 1. Paroxysmal atrial fibrillation - none noted on monitor, however, PSVT was noted 2. Chest/neck discomfort with diaphoresis - negative NST in 06/2013 3. Dyspnea on exertion 4. Hypertension - generally well controlled 5. Dyslipidemia - on statin 6. Significant past smoking history - quit in 2005 7. Family history of coronary disease  PLAN: 1.   Laurie Pugh  has done remarkably better on low-dose atenolol. She reports her palpitations have completely stopped. Her shortness of breath has improved somewhat. Her only complaint now is some occasional positional dizziness, which may be due to a lower heart rate. Since she had relatively little if any benefit from low-dose diltiazem, recommended discontinuing that today and continuing her on atenolol. I informed her she has any more breakthrough palpitations we could consider increasing the dose of atenolol slightly, but I'm not comfortable with a heart rate remaining in the 40s. Blood pressure is well controlled. Plan to see her back in 6 months or sooner as necessary.  Thanks again as always for allowing me to participate in the care of your patients.  Chrystie Nose, MD, [facility] Attending Cardiologist [facility]  HILTY,Kenneth C 08/19/2013, 8:44 AM

## 2013-11-01 ENCOUNTER — Other Ambulatory Visit: Payer: Self-pay | Admitting: Internal Medicine

## 2013-11-01 ENCOUNTER — Other Ambulatory Visit: Payer: Self-pay

## 2013-11-01 DIAGNOSIS — Z1231 Encounter for screening mammogram for malignant neoplasm of breast: Secondary | ICD-10-CM

## 2013-12-02 ENCOUNTER — Ambulatory Visit
Admission: RE | Admit: 2013-12-02 | Discharge: 2013-12-02 | Disposition: A | Discharge status: Home / Self Care | Payer: Medicare Other | Source: Ambulatory Visit

## 2013-12-02 DIAGNOSIS — Z1231 Encounter for screening mammogram for malignant neoplasm of breast: Secondary | ICD-10-CM

## 2014-04-14 ENCOUNTER — Telehealth: Payer: Self-pay | Admitting: Internal Medicine

## 2014-04-14 NOTE — Telephone Encounter (Signed)
Pt in afib.  Making her feel really bad....weak.

## 2014-04-14 NOTE — Telephone Encounter (Signed)
Spoke with pt, last weekend she had a lot of palpitations Saturday and Sunday. This weekend it started Friday night and cont today. She describes a skipping heart beat. It is not racing and her pulse has been 48 to 58. Her bp has been good at 135/70. She denies SOB or chest pain, just feels weak. Aware dr Rennis Goldenhilty not here today but will forward for his review. Pt to call back if symptoms change or worsen. Pt agreed with this plan.

## 2014-04-15 NOTE — Telephone Encounter (Signed)
Spoke with pt, Follow up scheduled  

## 2014-04-15 NOTE — Telephone Encounter (Signed)
Will need an appointment to see me or an extender - if she continues to have palpitations and bradycardia, this may be tachy-brady syndrome. A pacemaker evaluation may be necessary.  Dr. Rennis GoldenHilty

## 2014-04-16 ENCOUNTER — Ambulatory Visit: Payer: Medicare Other | Admitting: Cardiology

## 2014-04-16 ENCOUNTER — Encounter: Payer: Self-pay | Admitting: *Deleted

## 2014-04-17 ENCOUNTER — Ambulatory Visit (INDEPENDENT_AMBULATORY_CARE_PROVIDER_SITE_OTHER): Payer: Medicare Other | Admitting: Internal Medicine

## 2014-04-17 ENCOUNTER — Encounter: Payer: Self-pay | Admitting: Internal Medicine

## 2014-04-17 VITALS — BP 170/80 | HR 53 | Ht 63.0 in | Wt 130.9 lb

## 2014-04-17 DIAGNOSIS — I480 Paroxysmal atrial fibrillation: Secondary | ICD-10-CM

## 2014-04-17 DIAGNOSIS — I471 Supraventricular tachycardia: Secondary | ICD-10-CM

## 2014-04-17 DIAGNOSIS — E785 Hyperlipidemia, unspecified: Secondary | ICD-10-CM

## 2014-04-17 DIAGNOSIS — I498 Other specified cardiac arrhythmias: Secondary | ICD-10-CM

## 2014-04-17 DIAGNOSIS — R002 Palpitations: Secondary | ICD-10-CM

## 2014-04-17 DIAGNOSIS — R001 Bradycardia, unspecified: Secondary | ICD-10-CM

## 2014-04-17 DIAGNOSIS — I10 Essential (primary) hypertension: Secondary | ICD-10-CM

## 2014-04-17 DIAGNOSIS — I4891 Unspecified atrial fibrillation: Secondary | ICD-10-CM

## 2014-04-17 NOTE — Patient Instructions (Signed)
Your physician wants you to follow-up in: 6 months with Dr. Rennis GoldenHilty. You will receive a reminder letter in the mail two months in advance. If you don't receive a letter, please call our office to schedule the follow-up appointment.  You have been referred to an electrophysiologist [employer]([address])

## 2014-04-22 ENCOUNTER — Encounter: Payer: Self-pay | Admitting: Internal Medicine

## 2014-04-22 NOTE — Progress Notes (Signed)
OFFICE NOTE  Chief Complaint:  Palpitations, atrial fibrillation, dyspnea on exertion, neck/chest pain, diaphoresis  Primary Care Physician: Ezequiel KayserPERINI,MARK A, MD  HPI:  Joen LauraChristine D Pollinger is a pleasant 80 year old female with a history of smoking for 40 years, one pack per day. She quit in 2005 when she underwent heart catheterization. This demonstrated no significant coronary disease, despite significant risk factors. Her family history is significant for mother who had an MI and died at age 78 and her sister who had bypass surgery and died at age 78. Just as a history of atrial fibrillation and this is been ongoing for years. She says she is having symptoms paroxysmal 8 in about 2 weeks ago she noted her heart flipping and flopping out of rhythm. This episode was associated with neck pain which is new diaphoresis and some discomfort as well as shortness of breath. She is concerned about the new symptoms as they may be coronary equivalents. She is on medication for hypertension and dyslipidemia as well as warfarin which he takes for atrial fibrillation.   Based on her symptoms I recommended a LexiScan nuclear stress test. She did undergo this stress test on 07/02/2013. This was negative except for a small area of breast attenuation artifact. She was also placed on a CardioNet monitor which she wore between 06/24/2013 07/08/2013. There was 164 hours a total monitoring time. During that episode there were no rhythms that were self triggered. However the device did not medically trigger for heart rates in the 30s and 40s mostly at night with PVCs. Interestingly she had an episode on 07/04/2013 were she went into a abrupt onset narrow complex supraventricular tachycardia. This persisted for approximately 13 minutes and it was captured to abruptly terminate. She reports she was unaware of this episode. No atrial fibrillation was noted.  Because of her continuing palpitations, I recommended starting beta  blocker at her last visit.  Due to cost we switched her to atenolol 25 mg daily, and she noted after taking the first pill that she no longer had any further palpitations. She reports that she has suffered with palpitations for over 10 years and is so pleased that they've actually stopped.  It is noted however that her heart rate is much lower today in the 40's.  She occasionally gets some positional dizziness.  Mrs. Decicco called the office the other day and reported that she felt weak and that she may be out of rhythm, in A. Fib. I recommended that she made an appointment so we can get an EKG to see if she is truly out of rhythm. When I went into the room today asked her she felt that she was out of rhythm and she said he has however her EKG did demonstrate sinus rhythm. I think it is a problem and the fact that she is aware of palpitations however these are oftentimes PACs or PVCs but not necessarily A. fib. We have not been able to demonstrate that on monitoring. We have also decreased her dose of atenolol, potentially exposing her to more arrhythmias.  PMHx:  Past Medical History  Diagnosis Date  . Atrial fibrillation   . Dyslipidemia   . Hypertension     Past Surgical History  Procedure Laterality Date  . Abdominal hysterectomy    . Appendectomy    . Bladder surgery      bladder surgery  . Cardiac catheterization  06/28/2004    no significant CAD (Dr. Laurell Josephs. McQueen)  . Transthoracic  echocardiogram  2009    borderline conc LVH; trace MR; mild-mod TR, RVSP 30-49mmHg    FAMHx:  Family History  Problem Relation Age of Onset  . Heart disease Mother   . Heart disease Sister   . Suicidality Brother   . Cancer Sister   . COPD Sister   . Suicidality Child     SOCHx:   reports that she quit smoking about 10 years ago. She has never used smokeless tobacco. She reports that she does not drink alcohol or use illicit drugs.  ALLERGIES:  Allergies  Allergen Reactions  . Codeine Nausea Only   . Other     Mycins - cannot recall whether azithromycin, e-mycin    ROS: A comprehensive review of systems was negative except for: Neurological: positive for dizziness  HOME MEDS: Current Outpatient Prescriptions  Medication Sig Dispense Refill  . atenolol (TENORMIN) 25 MG tablet Take 1 tablet (25 mg total) by mouth every morning.  90 tablet  3  . benazepril (LOTENSIN) 20 MG tablet Take 1 tablet by mouth daily.      . Calcium Carbonate-Vitamin D (CALTRATE 600+D) 600-400 MG-UNIT per chew tablet Chew 2 tablets by mouth daily.      . chlorthalidone (HYGROTON) 25 MG tablet Take 12.5 mg by mouth daily as needed.      Marland Kitchen LORazepam (ATIVAN) 1 MG tablet Take 0.5-1 tablets by mouth daily.      . rosuvastatin (CRESTOR) 10 MG tablet Take 10 mg by mouth daily.      Marland Kitchen warfarin (COUMADIN) 2 MG tablet Take by mouth daily. Per INR       No current facility-administered medications for this visit.    LABS/IMAGING: No results found for this or any previous visit (from the past 48 hour(s)). No results found.  VITALS: BP 170/80  Pulse 53  Ht 5\' 3"  (1.6 m)  Wt 130 lb 14.4 oz (59.376 kg)  BMI 23.19 kg/m2  EXAM: GEN: Awake, in no acute distress HEENT: PERRLA, EOMI Lungs: Clear bilaterally Cardiovascular: Regular rate and rhythm, normal S1-S2, no murmur rub or gallop Abdomen: Soft, nontender Extremities: No edema Neurologic: Nonfocal Psychiatric: Anxious  EKG: Sinus bradycardia at 53  ASSESSMENT: 1. Paroxysmal atrial fibrillation - none noted on monitor, however, PSVT was noted, this is complicated by bradycardia an inability to uptitrate her b-blocker 2. Chest/neck discomfort with diaphoresis - negative NST in 06/2013 3. Dyspnea on exertion 4. Hypertension - generally well controlled 5. Dyslipidemia - on statin 6. Significant past smoking history - quit in 2005 7. Family history of coronary disease  PLAN: 1.   Mrs. Menta  continues to be bothered by palpitations and what she feels  is paroxysmal A. fib however we have not been able to demonstrate that on a monitor. She does have short bursts of SVT and PACs as well as PVCs. Increases in her atenolol beyond 25 mg every is all that in marked bradycardia. Her heart rate today is fairly low. I do not feel that there is room to increase her beta blocker more. Options may include switching her over to an antiarrhythmic such as flecainide, or possibly referral for electrophysiology evaluation. Based on the fact that she has significant bradycardia and little room to increase her medications, I am leaning toward antiarrhythmic therapy. I would like to have another opinion from one of my partners regarding the best choice. She is agreeable to a referral. I will keep you updated on the results of that consultation.  Iantha Fallen  C. Rennis Golden, MD, [facility] Attending Cardiologist [facility]  Okie Jansson C 04/22/2014, 8:04 PM

## 2014-04-24 NOTE — Telephone Encounter (Signed)
Opened in error

## 2014-05-26 ENCOUNTER — Encounter: Payer: Self-pay | Admitting: Internal Medicine

## 2014-05-26 ENCOUNTER — Ambulatory Visit (INDEPENDENT_AMBULATORY_CARE_PROVIDER_SITE_OTHER): Payer: Medicare Other | Admitting: Internal Medicine

## 2014-05-26 VITALS — BP 200/90 | HR 46 | Ht 63.0 in | Wt 130.0 lb

## 2014-05-26 DIAGNOSIS — I471 Supraventricular tachycardia: Secondary | ICD-10-CM

## 2014-05-26 DIAGNOSIS — I493 Ventricular premature depolarization: Secondary | ICD-10-CM | POA: Insufficient documentation

## 2014-05-26 DIAGNOSIS — I10 Essential (primary) hypertension: Secondary | ICD-10-CM

## 2014-05-26 DIAGNOSIS — I498 Other specified cardiac arrhythmias: Secondary | ICD-10-CM

## 2014-05-26 DIAGNOSIS — I4949 Other premature depolarization: Secondary | ICD-10-CM

## 2014-05-26 DIAGNOSIS — R001 Bradycardia, unspecified: Secondary | ICD-10-CM | POA: Insufficient documentation

## 2014-05-26 NOTE — Progress Notes (Signed)
Primary Care Physician: Ezequiel Kayser, MD Referring Physician: Dr. Cherie Ouch is a 80 y.o. female with a h/o  SVT, bradycardia, premature contractions, here today for electrophysiology consultation. Patient gives a history of having an ER visit 10 years ago for rapid heartbeat,  for which she received an adenosine  Injection x 3. This did ultimately return her to sinus rhythm and she was placed on diltiazem for which she did well for many years. She was placed on warfarin for " heart racing.". She has now been on atenolol for several years after diltiazem did not work as well for her and has done well until a day in July when she was sick from another reason and had more palpitations and reported this to the doctor.  She was asked to be seen here since her bradycardia prevented further attempts at rhythm/rate control. A monitor is reviewed from 2014 showing   sinus bradycardia with PVCs, and occasional SVT. Patient states she is not aware of any heart racing, just the skips. Since July when she was sick, she has not really had any other issues with heart rhythm. Is not symptomatic with bradycardia.   Today, she denies symptoms of palpitations, chest pain, shortness of breath, orthopnea, PND, lower extremity edema, dizziness, presyncope, syncope, or neurologic sequela. The patient is tolerating medications without difficulties and is otherwise without complaint today.   Past Medical History  Diagnosis Date  . Atrial fibrillation   . Dyslipidemia   . Hypertension   . Hyperlipidemia   . PSVT (paroxysmal supraventricular tachycardia)   . PAF (paroxysmal atrial fibrillation)   . Neck pain   . DOE (dyspnea on exertion)   . Angina pectoris, unstable    Past Surgical History  Procedure Laterality Date  . Abdominal hysterectomy    . Appendectomy    . Bladder surgery      bladder surgery  . Cardiac catheterization  06/28/2004    no significant CAD (Dr. Laurell Josephs)  .  Transthoracic echocardiogram  2009    borderline conc LVH; trace MR; mild-mod TR, RVSP 30-12mmHg    Current Outpatient Prescriptions  Medication Sig Dispense Refill  . atenolol (TENORMIN) 25 MG tablet Take 1 tablet (25 mg total) by mouth every morning.  90 tablet  3  . benazepril (LOTENSIN) 20 MG tablet Take 1 tablet by mouth daily.      . Calcium Carbonate-Vitamin D (CALTRATE 600+D) 600-400 MG-UNIT per chew tablet Chew 2 tablets by mouth daily.      . chlorthalidone (HYGROTON) 25 MG tablet Take 12.5 mg by mouth daily as needed (fluid).       . LORazepam (ATIVAN) 1 MG tablet Take 1 mg by mouth 2 (two) times daily.       . rosuvastatin (CRESTOR) 10 MG tablet Take 10 mg by mouth daily.      Marland Kitchen warfarin (COUMADIN) 2 MG tablet Take 2 mg by mouth as directed. Per INR       No current facility-administered medications for this visit.    Allergies  Allergen Reactions  . Other Other (See Comments)    Mycins - cannot recall whether azithromycin, e-mycin (REACTION: unknown)  . Codeine Nausea Only    History   Social History  . Marital Status: Widowed    Spouse Name: N/A    Number of Children: N/A  . Years of Education: N/A   Occupational History  . Not on file.   Social History Main Topics  .  Smoking status: Former Smoker -- 1.00 packs/day for 40 years    Quit date: 06/25/2003  . Smokeless tobacco: Never Used  . Alcohol Use: No  . Drug Use: No  . Sexual Activity: Not on file   Other Topics Concern  . Not on file   Social History Narrative  . No narrative on file    Family History  Problem Relation Age of Onset  . Heart disease Mother   . Heart disease Sister   . Suicidality Brother   . Cancer Sister   . COPD Sister   . Suicidality Child     ROS- All systems are reviewed and negative except as per the HPI above  Physical Exam: Filed Vitals:   05/26/14 0831  BP: 200/90  Pulse: 46  Height:  (1.6 m)  Weight: 58.968 kg (130 lb)    GEN- The patient is well  appearing, alert and oriented x 3 today.   Head- normocephalic, atraumatic Eyes-  Sclera clear, conjunctiva pink Ears- hearing intact Oropharynx- clear Neck- supple, no JVP Lymph- no cervical lymphadenopathy Lungs- Clear to ausculation bilaterally, normal work of breathing Heart- Regular rate and rhythm, no murmurs, rubs or gallops, PMI not laterally displaced GI- soft, NT, ND, + BS Extremities- no clubbing, cyanosis, or edema MS- no significant deformity or atrophy Skin- no rash or lesion Psych- euthymic mood, full affect Neuro- strength and sensation are intact  EKG-Sinus brady at 46 bpm. Cardionet/ epic records reviewed in detail.  Assessment and Plan:  1.SVT - previously documented SVT.  She feels that this is presently improved with atenolol Continue with  atenolol, pt was reassured that palpitaions although aggravating not life threatening and did not require change in therapy .  2. Hypertension- rechecked by me 160/80. Per pt this is unusual for her. Avoid salt and follow at home. If continues to be elevated,  f/u with PCP. High blood pressure may be contributing to palps.  3. Bradycardia- asymptomatic    I have seen, examined the patient, and reviewed the above assessment and plan Rudi Coco NP.  Changes to above are made where necessary.  Her recent symptoms are more consistent with PVCs than SVT.  Though she has a h/o SVT, this appears to be well controlled with atenolol.  She is apparently on coumadin for afib though I have reviewed all ekgs and event monitors in epic and do not see afib documented. She has no symptoms with bradycardia and therefore would not benefit from pacemaker implantation at this time. I think that at this time a conservative strategy of watchful waiting is most beneficial.  She wishes to avoid invasive EP procedures if possible. Continue current medicines, return to see me as needed.  Co Sign: Hillis Range, MD 05/26/2014 8:24 PM

## 2014-05-26 NOTE — Patient Instructions (Signed)
Your physician wants you to follow-up: As needed with Dr. Lollie Sails, NP.

## 2014-06-15 ENCOUNTER — Other Ambulatory Visit: Payer: Self-pay | Admitting: Internal Medicine

## 2014-06-16 NOTE — Telephone Encounter (Signed)
Rx was sent to pharmacy electronically. 

## 2014-10-28 ENCOUNTER — Other Ambulatory Visit: Payer: Self-pay

## 2014-10-28 DIAGNOSIS — Z1231 Encounter for screening mammogram for malignant neoplasm of breast: Secondary | ICD-10-CM

## 2014-12-08 ENCOUNTER — Ambulatory Visit
Admission: RE | Admit: 2014-12-08 | Discharge: 2014-12-08 | Disposition: A | Discharge status: Home / Self Care | Payer: Medicare Other | Source: Ambulatory Visit

## 2014-12-08 DIAGNOSIS — Z1231 Encounter for screening mammogram for malignant neoplasm of breast: Secondary | ICD-10-CM

## 2015-06-15 ENCOUNTER — Other Ambulatory Visit: Payer: Self-pay | Admitting: *Deleted

## 2015-06-15 MED ORDER — ATENOLOL 25 MG PO TABS: 25.0000 mg | ORAL_TABLET | Freq: Every day | ORAL | Status: DC

## 2015-08-03 ENCOUNTER — Other Ambulatory Visit: Payer: Self-pay | Admitting: Internal Medicine

## 2015-11-11 ENCOUNTER — Ambulatory Visit: Payer: Medicare Other | Admitting: Physician Assistant

## 2015-11-12 ENCOUNTER — Other Ambulatory Visit: Payer: Self-pay | Admitting: Internal Medicine

## 2015-11-12 NOTE — Telephone Encounter (Signed)
Rx request sent to pharmacy.  

## 2015-11-13 ENCOUNTER — Encounter: Payer: Self-pay | Admitting: Internal Medicine

## 2015-11-13 ENCOUNTER — Ambulatory Visit (INDEPENDENT_AMBULATORY_CARE_PROVIDER_SITE_OTHER): Payer: Medicare Other | Admitting: Internal Medicine

## 2015-11-13 VITALS — BP 150/82 | HR 45 | Ht 63.0 in | Wt 135.8 lb

## 2015-11-13 DIAGNOSIS — E785 Hyperlipidemia, unspecified: Secondary | ICD-10-CM

## 2015-11-13 DIAGNOSIS — I480 Paroxysmal atrial fibrillation: Secondary | ICD-10-CM

## 2015-11-13 DIAGNOSIS — Z79899 Other long term (current) drug therapy: Secondary | ICD-10-CM

## 2015-11-13 DIAGNOSIS — I471 Supraventricular tachycardia: Secondary | ICD-10-CM

## 2015-11-13 DIAGNOSIS — R001 Bradycardia, unspecified: Secondary | ICD-10-CM

## 2015-11-13 MED ORDER — ATENOLOL 25 MG PO TABS: 25.0000 mg | ORAL_TABLET | Freq: Every day | ORAL | Status: DC

## 2015-11-13 NOTE — Progress Notes (Signed)
OFFICE NOTE  Chief Complaint:  Recent UTI  Primary Care Physician: Ezequiel Kayser, MD  HPI:  Laurie Pugh is a pleasant 82 year old female with a history of smoking for 40 years, one pack per day. She quit in 2005 when she underwent heart catheterization. This demonstrated no significant coronary disease, despite significant risk factors. Her family history is significant for mother who had an MI and died at age 63 and her sister who had bypass surgery and died at age 68. Just as a history of atrial fibrillation and this is been ongoing for years. She says she is having symptoms paroxysmal 8 in about 2 weeks ago she noted her heart flipping and flopping out of rhythm. This episode was associated with neck pain which is new diaphoresis and some discomfort as well as shortness of breath. She is concerned about the new symptoms as they may be coronary equivalents. She is on medication for hypertension and dyslipidemia as well as warfarin which he takes for atrial fibrillation.   Based on her symptoms I recommended a LexiScan nuclear stress test. She did undergo this stress test on 07/02/2013. This was negative except for a small area of breast attenuation artifact. She was also placed on a CardioNet monitor which she wore between 06/24/2013 07/08/2013. There was 164 hours a total monitoring time. During that episode there were no rhythms that were self triggered. However the device did not medically trigger for heart rates in the 30s and 40s mostly at night with PVCs. Interestingly she had an episode on 07/04/2013 were she went into a abrupt onset narrow complex supraventricular tachycardia. This persisted for approximately 13 minutes and it was captured to abruptly terminate. She reports she was unaware of this episode. No atrial fibrillation was noted.  Because of her continuing palpitations, I recommended starting beta blocker at her last visit.  Due to cost we switched her to atenolol 25 mg  daily, and she noted after taking the first pill that she no longer had any further palpitations. She reports that she has suffered with palpitations for over 10 years and is so pleased that they've actually stopped.  It is noted however that her heart rate is much lower today in the 40's.  She occasionally gets some positional dizziness.  Laurie Pugh called the office the other day and reported that she felt weak and that she may be out of rhythm, in A. Fib. I recommended that she made an appointment so we can get an EKG to see if she is truly out of rhythm. When I went into the room today asked her she felt that she was out of rhythm and she said he has however her EKG did demonstrate sinus rhythm. I think it is a problem and the fact that she is aware of palpitations however these are oftentimes PACs or PVCs but not necessarily A. fib. We have not been able to demonstrate that on monitoring. We have also decreased her dose of atenolol, potentially exposing her to more arrhythmias.  I had the pleasure see Mr. back in the office today. Overall she is doing well denies any chest pain or worsening shortness of breath. She still has intermittent episodes of A. fib, in fact had an episode when she was recently at her primary care doctor's office. Her INR was not well-regulated on warfarin and therefore she was switched to Eliquis which she seems to be tolerating well. She currently has a UTI and is on Cipro.  Apparently she has some degree of a cystocele. She is not positive in a candidate for bladder surgery. I asked her whether or not she was using a pessary, which may be an option for her. She can discuss this further with her primary care doctor.  PMHx:  Past Medical History  Diagnosis Date  . Atrial fibrillation (HCC)   . Dyslipidemia   . Hypertension   . Hyperlipidemia   . PSVT (paroxysmal supraventricular tachycardia) (HCC)   . PAF (paroxysmal atrial fibrillation) (HCC)   . Neck pain   . DOE  (dyspnea on exertion)   . Angina pectoris, unstable (HCC)     Past Surgical History  Procedure Laterality Date  . Abdominal hysterectomy    . Appendectomy    . Bladder surgery      bladder surgery  . Cardiac catheterization  06/28/2004    no significant CAD (Dr. Laurell Josephs)  . Transthoracic echocardiogram  2009    borderline conc LVH; trace MR; mild-mod TR, RVSP 30-14mmHg    FAMHx:  Family History  Problem Relation Age of Onset  . Heart disease Mother   . Heart disease Sister   . Suicidality Brother   . Cancer Sister   . COPD Sister   . Suicidality Child     SOCHx:   reports that she quit smoking about 12 years ago. She has never used smokeless tobacco. She reports that she does not drink alcohol or use illicit drugs.  ALLERGIES:  Allergies  Allergen Reactions  . Other Other (See Comments)    Mycins - cannot recall whether azithromycin, e-mycin (REACTION: unknown)  . Codeine Nausea Only    ROS: Pertinent items noted in HPI and remainder of comprehensive ROS otherwise negative.  HOME MEDS: Current Outpatient Prescriptions  Medication Sig Dispense Refill  . apixaban (ELIQUIS) 5 MG TABS tablet Take 5 mg by mouth 2 (two) times daily.    Marland Kitchen atenolol (TENORMIN) 25 MG tablet Take 1 tablet (25 mg total) by mouth daily. 90 tablet 3  . benazepril (LOTENSIN) 20 MG tablet Take 1 tablet by mouth daily.    . Calcium Carbonate-Vitamin D (CALTRATE 600+D) 600-400 MG-UNIT per chew tablet Chew 2 tablets by mouth daily.    . ciprofloxacin (CIPRO) 500 MG tablet Take 1 tablet by mouth 2 (two) times daily. For 7 days    . gabapentin (NEURONTIN) 100 MG capsule Take 1 capsule by mouth 3 (three) times daily.    Marland Kitchen LORazepam (ATIVAN) 1 MG tablet Take 1 mg by mouth 2 (two) times daily.     . rosuvastatin (CRESTOR) 10 MG tablet Take 10 mg by mouth daily.     No current facility-administered medications for this visit.    LABS/IMAGING: No results found for this or any previous visit (from the  past 48 hour(s)). No results found.  VITALS: BP 150/82 mmHg  Pulse 45  Ht  (1.6 m)  Wt 135 lb 12.8 oz (61.598 kg)  BMI 24.06 kg/m2  EXAM: GEN: Awake, in no acute distress HEENT: PERRLA, EOMI Lungs: Clear bilaterally Cardiovascular: Regular rate and rhythm, normal S1-S2, no murmur rub or gallop Abdomen: Soft, nontender Extremities: No edema Neurologic: Nonfocal Psychiatric: Anxious  EKG: Sinus bradycardia at 45, tall peaked T waves  ASSESSMENT: 1. Paroxysmal atrial fibrillation - on Eliquis 2. Hypertension - generally well controlled 3. Dyslipidemia - on statin 4. Significant past smoking history - quit in 2005 5. Family history of coronary disease  PLAN: 1.   Laurie Pugh  reports an improvement in her shortness of breath and chest pain. She has intermittent PAF, but is on maximal dose of atenolol that can be tolerated due to bradycardia. Although heart rate is low today in the 40s and has been in the 50s, she says she is asymptomatic. Blood pressure is generally well controlled. She's due for recheck of cholesterol which we'll order today as well as a metabolic profile. She does have some tall peaked T waves however this was seen in the past and it do not think is related to hyperkalemia, but we will check that as well on her labs today.  Follow-up annually or sooner as necessary.  Chrystie Nose, MD, [facility] Attending Cardiologist [facility]   Lisette Abu [facility] 11/13/2015, 10:09 AM

## 2015-11-13 NOTE — Patient Instructions (Addendum)
Your physician recommends that you return for lab work FASTING - cmet, lipid  Your physician wants you to follow-up in: 1 year with Dr. Rennis Golden. You will receive a reminder letter in the mail two months in advance. If you don't receive a letter, please call our office to schedule the follow-up appointment.  Samples of eliquis  - 3 boxes - given to patient Printed patient assistance application for patient

## 2015-11-20 LAB — LIPID PANEL
CHOL/HDL RATIO: 2.9 ratio (ref <=5.0)
CHOLESTEROL: 123 mg/dL — AB (ref 125–200)
HDL: 42 mg/dL — ABNORMAL LOW (ref >=46)
LDL Cholesterol: 52 mg/dL (ref <130)
TRIGLYCERIDES: 144 mg/dL (ref <150)
VLDL: 29 mg/dL (ref <30)

## 2015-11-20 LAB — COMPREHENSIVE METABOLIC PANEL
ALBUMIN: 4 g/dL (ref 3.6–5.1)
ALK PHOS: 71 U/L (ref 33–130)
ALT: 7 U/L (ref 6–29)
AST: 16 U/L (ref 10–35)
BUN: 14 mg/dL (ref 7–25)
CALCIUM: 9.3 mg/dL (ref 8.6–10.4)
CHLORIDE: 103 mmol/L (ref 98–110)
CO2: 30 mmol/L (ref 20–31)
Creat: 0.67 mg/dL (ref 0.60–0.88)
Glucose, Bld: 91 mg/dL (ref 65–99)
POTASSIUM: 4.8 mmol/L (ref 3.5–5.3)
Sodium: 140 mmol/L (ref 135–146)
TOTAL PROTEIN: 7.1 g/dL (ref 6.1–8.1)
Total Bilirubin: 0.5 mg/dL (ref 0.2–1.2)

## 2015-11-27 ENCOUNTER — Other Ambulatory Visit: Payer: Self-pay

## 2015-11-27 DIAGNOSIS — Z1231 Encounter for screening mammogram for malignant neoplasm of breast: Secondary | ICD-10-CM

## 2015-12-07 ENCOUNTER — Other Ambulatory Visit: Payer: Self-pay | Admitting: *Deleted

## 2015-12-07 MED ORDER — ATENOLOL 25 MG PO TABS: 25.0000 mg | ORAL_TABLET | Freq: Every day | ORAL | Status: DC

## 2015-12-28 ENCOUNTER — Ambulatory Visit
Admission: RE | Admit: 2015-12-28 | Discharge: 2015-12-28 | Disposition: A | Discharge status: Home / Self Care | Payer: Medicare Other | Source: Ambulatory Visit

## 2015-12-28 DIAGNOSIS — Z1231 Encounter for screening mammogram for malignant neoplasm of breast: Secondary | ICD-10-CM

## 2016-04-04 ENCOUNTER — Other Ambulatory Visit: Payer: Self-pay | Admitting: Urology

## 2016-04-06 ENCOUNTER — Encounter (HOSPITAL_COMMUNITY)
Admission: RE | Admit: 2016-04-06 | Discharge: 2016-04-06 | Disposition: A | Discharge status: Home / Self Care | Payer: Medicare Other | Source: Ambulatory Visit | Attending: Urology | Admitting: Urology

## 2016-04-06 ENCOUNTER — Encounter (HOSPITAL_COMMUNITY): Payer: Self-pay

## 2016-04-06 DIAGNOSIS — Z01812 Encounter for preprocedural laboratory examination: Secondary | ICD-10-CM | POA: Diagnosis not present

## 2016-04-06 HISTORY — DX: Cardiac arrhythmia, unspecified: I49.9

## 2016-04-06 LAB — BASIC METABOLIC PANEL
Anion gap: 7 (ref 5–15)
BUN: 13 mg/dL (ref 6–20)
CHLORIDE: 102 mmol/L (ref 101–111)
CO2: 29 mmol/L (ref 22–32)
Calcium: 9.5 mg/dL (ref 8.9–10.3)
Creatinine, Ser: 0.76 mg/dL (ref 0.44–1.00)
GFR calc Af Amer: >60 mL/min (ref >60)
GFR calc non Af Amer: >60 mL/min (ref >60)
GLUCOSE: 100 mg/dL — AB (ref 65–99)
POTASSIUM: 4.2 mmol/L (ref 3.5–5.1)
Sodium: 138 mmol/L (ref 135–145)

## 2016-04-06 LAB — CBC
HCT: 43.2 % (ref 36.0–46.0)
HEMOGLOBIN: 13.7 g/dL (ref 12.0–15.0)
MCH: 28.3 pg (ref 26.0–34.0)
MCHC: 31.7 g/dL (ref 30.0–36.0)
MCV: 89.3 fL (ref 78.0–100.0)
Platelets: 210 10*3/uL (ref 150–400)
RBC: 4.84 MIL/uL (ref 3.87–5.11)
RDW: 15.5 % (ref 11.5–15.5)
WBC: 7.8 10*3/uL (ref 4.0–10.5)

## 2016-04-06 LAB — PROTIME-INR
INR: 1.33 (ref 0.00–1.49)
PROTHROMBIN TIME: 16.1 s — AB (ref 11.6–15.2)

## 2016-04-06 NOTE — Pre-Procedure Instructions (Signed)
EKGs from 2015 and 2017 shown to Dr. Leta JunglingEwell and he approved.

## 2016-04-06 NOTE — Patient Instructions (Signed)
Laurie Pugh  04/06/2016   Your procedure is scheduled on: 04/11/16  [facility]  [facility] 3rd floor to  Short Stay Center at 9:30 AM.  Call this number if you have problems the morning of surgery [phone]   Remember: ONLY 1 PERSON MAY GO WITH YOU TO SHORT STAY TO GET  READY MORNING OF YOUR SURGERY.  Do not eat food or drink liquids :After Midnight.Sunday     Take these medicines the morning of surgery with A SIP OF WATER: Atenolol, Gabapentin, Lorazepam, Crestor                                You may not have any metal on your body including hair pins and              piercings. Do not wear jewelry, make-up, lotions, powders or perfumes, deodorant             Do not wear nail polish.  Do not shave  48 hours prior to surgery.                Do not bring valuables to the hospital. [facility] IS NOT             RESPONSIBLE   FOR VALUABLES.  Contacts, dentures or bridgework may not be worn into surgery.  Leave suitcase in the car. After surgery it may be brought to your room.     Patients discharged the day of surgery will not be allowed to drive home.  Name and phone number of your driver:              _____________________________________________________________________             [facility] Before surgery, you can play an important role.  Because skin is not sterile, your skin needs to be as free of germs as possible.  You can reduce the number of germs on your skin by washing with CHG (chlorahexidine gluconate) soap before surgery.  CHG is an antiseptic cleaner which kills germs and bonds with the skin to continue killing germs even after washing. Please DO NOT use if you have an allergy to CHG or antibacterial soaps.  If your skin becomes reddened/irritated stop using the CHG and inform your nurse when you arrive at Short Stay. Do not shave (including legs and underarms) for at  least 48 hours prior to the first CHG shower.  You may shave your face/neck. Please follow these instructions carefully:  1.  Shower with CHG Soap the night before surgery and the  morning of Surgery.  2.  If you choose to wash your hair, wash your hair first as usual with your  normal  shampoo.  3.  After you shampoo, rinse your hair and body thoroughly to remove the  shampoo.                           4.  Use CHG as you would any other liquid soap.  You can apply chg directly  to the skin and wash                       Gently with a scrungie or clean washcloth.  5.  Apply the CHG Soap to your body ONLY FROM THE NECK DOWN.   Do not use on face/ open                           Wound or open sores. Avoid contact with eyes, ears mouth and genitals (private parts).                       Wash face,  Genitals (private parts) with your normal soap.             6.  Wash thoroughly, paying special attention to the area where your surgery  will be performed.  7.  Thoroughly rinse your body with warm water from the neck down.  8.  DO NOT shower/wash with your normal soap after using and rinsing off  the CHG Soap.                9.  Pat yourself dry with a clean towel.            10.  Wear clean pajamas.            11.  Place clean sheets on your bed the night of your first shower and do not  sleep with pets. Day of Surgery : Do not apply any lotions/deodorants the morning of surgery.  Please wear clean clothes to the hospital/surgery center.  FAILURE TO FOLLOW THESE INSTRUCTIONS MAY RESULT IN THE CANCELLATION OF YOUR SURGERY PATIENT SIGNATURE_________________________________  NURSE SIGNATURE__________________________________  ________________________________________________________________________

## 2016-04-06 NOTE — Pre-Procedure Instructions (Signed)
preop PT result routed to Dr. Sherryl BartersBudzyn.

## 2016-04-11 ENCOUNTER — Encounter (HOSPITAL_COMMUNITY)
Admission: RE | Disposition: A | Discharge status: Home / Self Care | Payer: Self-pay | Source: Ambulatory Visit | Attending: Urology

## 2016-04-11 ENCOUNTER — Ambulatory Visit (HOSPITAL_COMMUNITY): Payer: Medicare Other | Admitting: Anesthesiology

## 2016-04-11 ENCOUNTER — Ambulatory Visit (HOSPITAL_COMMUNITY)
Admission: RE | Admit: 2016-04-11 | Discharge: 2016-04-11 | Disposition: A | Discharge status: Home / Self Care | Payer: Medicare Other | Source: Ambulatory Visit | Attending: Urology | Admitting: Urology

## 2016-04-11 ENCOUNTER — Encounter (HOSPITAL_COMMUNITY): Payer: Self-pay | Admitting: *Deleted

## 2016-04-11 DIAGNOSIS — N210 Calculus in bladder: Secondary | ICD-10-CM | POA: Insufficient documentation

## 2016-04-11 DIAGNOSIS — Z91041 Radiographic dye allergy status: Secondary | ICD-10-CM | POA: Insufficient documentation

## 2016-04-11 DIAGNOSIS — Z87891 Personal history of nicotine dependence: Secondary | ICD-10-CM | POA: Insufficient documentation

## 2016-04-11 DIAGNOSIS — I10 Essential (primary) hypertension: Secondary | ICD-10-CM | POA: Diagnosis not present

## 2016-04-11 DIAGNOSIS — Z79899 Other long term (current) drug therapy: Secondary | ICD-10-CM | POA: Diagnosis not present

## 2016-04-11 DIAGNOSIS — Z7901 Long term (current) use of anticoagulants: Secondary | ICD-10-CM | POA: Diagnosis not present

## 2016-04-11 DIAGNOSIS — N8110 Cystocele, unspecified: Secondary | ICD-10-CM | POA: Diagnosis not present

## 2016-04-11 DIAGNOSIS — R3989 Other symptoms and signs involving the genitourinary system: Secondary | ICD-10-CM | POA: Diagnosis present

## 2016-04-11 DIAGNOSIS — I4891 Unspecified atrial fibrillation: Secondary | ICD-10-CM | POA: Diagnosis not present

## 2016-04-11 DIAGNOSIS — R310 Gross hematuria: Secondary | ICD-10-CM | POA: Insufficient documentation

## 2016-04-11 HISTORY — PX: CYSTOSCOPY W/ RETROGRADES: SHX1426

## 2016-04-11 SURGERY — CYSTOSCOPY, WITH RETROGRADE PYELOGRAM
Anesthesia: General | Laterality: Bilateral

## 2016-04-11 MED ORDER — PHENYLEPHRINE HCL 10 MG/ML IJ SOLN
INTRAMUSCULAR | Status: DC | PRN
  Administered 2016-04-11: 40 ug via INTRAVENOUS

## 2016-04-11 MED ORDER — LIDOCAINE HCL (CARDIAC) 20 MG/ML IV SOLN
INTRAVENOUS | Status: DC | PRN
  Administered 2016-04-11: 50 mg via INTRAVENOUS

## 2016-04-11 MED ORDER — PROPOFOL 10 MG/ML IV BOLUS
INTRAVENOUS | Status: DC | PRN
  Administered 2016-04-11: 200 mg via INTRAVENOUS

## 2016-04-11 MED ORDER — EPHEDRINE SULFATE 50 MG/ML IJ SOLN
INTRAMUSCULAR | Status: DC | PRN
  Administered 2016-04-11: 5 mg via INTRAVENOUS
  Administered 2016-04-11 (×2): 10 mg via INTRAVENOUS

## 2016-04-11 MED ORDER — IOPAMIDOL (ISOVUE-300) INJECTION 61%
INTRAVENOUS | Status: DC | PRN
  Administered 2016-04-11: 50 mL via INTRAVENOUS

## 2016-04-11 MED ORDER — HYDROCODONE-ACETAMINOPHEN 5-325 MG PO TABS: 1.0000 | ORAL_TABLET | Freq: Four times a day (QID) | ORAL | Status: DC | PRN

## 2016-04-11 MED ORDER — FENTANYL CITRATE (PF) 100 MCG/2ML IJ SOLN
INTRAMUSCULAR | Status: AC
  Filled 2016-04-11: qty 2

## 2016-04-11 MED ORDER — GLYCOPYRROLATE 0.2 MG/ML IJ SOLN
INTRAMUSCULAR | Status: DC | PRN
  Administered 2016-04-11: 0.1 mg via INTRAVENOUS

## 2016-04-11 MED ORDER — LACTATED RINGERS IV SOLN
INTRAVENOUS | Status: DC
  Administered 2016-04-11: 1000 mL via INTRAVENOUS
  Administered 2016-04-11: 11:00:00 via INTRAVENOUS

## 2016-04-11 MED ORDER — CEFAZOLIN SODIUM-DEXTROSE 2-4 GM/100ML-% IV SOLN
INTRAVENOUS | Status: AC
  Filled 2016-04-11: qty 100

## 2016-04-11 MED ORDER — LIDOCAINE HCL (CARDIAC) 20 MG/ML IV SOLN
INTRAVENOUS | Status: AC
  Filled 2016-04-11: qty 5

## 2016-04-11 MED ORDER — ONDANSETRON HCL 4 MG/2ML IJ SOLN
INTRAMUSCULAR | Status: AC
  Filled 2016-04-11: qty 2

## 2016-04-11 MED ORDER — CEFAZOLIN SODIUM-DEXTROSE 2-4 GM/100ML-% IV SOLN
2.0000 g | INTRAVENOUS | Status: AC
  Administered 2016-04-11: 2 g via INTRAVENOUS

## 2016-04-11 MED ORDER — GLYCOPYRROLATE 0.2 MG/ML IJ SOLN
INTRAMUSCULAR | Status: AC
  Filled 2016-04-11: qty 1

## 2016-04-11 MED ORDER — CEPHALEXIN 500 MG PO CAPS: 500.0000 mg | ORAL_CAPSULE | Freq: Three times a day (TID) | ORAL | Status: DC

## 2016-04-11 MED ORDER — FENTANYL CITRATE (PF) 100 MCG/2ML IJ SOLN
INTRAMUSCULAR | Status: DC | PRN
  Administered 2016-04-11 (×2): 50 ug via INTRAVENOUS

## 2016-04-11 MED ORDER — PROPOFOL 10 MG/ML IV BOLUS
INTRAVENOUS | Status: AC
  Filled 2016-04-11: qty 20

## 2016-04-11 MED ORDER — ONDANSETRON HCL 4 MG/2ML IJ SOLN
INTRAMUSCULAR | Status: DC | PRN
  Administered 2016-04-11: 4 mg via INTRAVENOUS

## 2016-04-11 SURGICAL SUPPLY — 18 items
BAG URO CATCHER STRL LF (MISCELLANEOUS) ×1 IMPLANT
BASKET ZERO TIP NITINOL 2.4FR (BASKET) IMPLANT
BSKT STON RTRVL ZERO TP 2.4FR (BASKET)
CATH INTERMIT  6FR 70CM (CATHETERS) ×2 IMPLANT
CLOTH BEACON ORANGE TIMEOUT ST (SAFETY) ×3 IMPLANT
FIBER LASER FLEXIVA 365 (UROLOGICAL SUPPLIES) IMPLANT
FIBER LASER TRAC TIP (UROLOGICAL SUPPLIES) IMPLANT
GLOVE BIO SURGEON STRL SZ7.5 (GLOVE) ×3 IMPLANT
GOWN STRL REUS W/TWL LRG LVL3 (GOWN DISPOSABLE) ×6 IMPLANT
GUIDEWIRE ANG ZIPWIRE 038X150 (WIRE) IMPLANT
GUIDEWIRE STR DUAL SENSOR (WIRE) ×3 IMPLANT
IV NS 1000ML (IV SOLUTION) ×3
IV NS 1000ML BAXH (IV SOLUTION) ×1 IMPLANT
MANIFOLD NEPTUNE II (INSTRUMENTS) ×3 IMPLANT
PACK CYSTO (CUSTOM PROCEDURE TRAY) ×3 IMPLANT
SHEATH ACCESS URETERAL 38CM (SHEATH) IMPLANT
TUBING CONNECTING 10 (TUBING) ×2 IMPLANT
TUBING CONNECTING 10' (TUBING) ×1

## 2016-04-11 NOTE — Anesthesia Postprocedure Evaluation (Signed)
Anesthesia Post Note  Patient: Laurie Pugh  Procedure(s) Performed: Procedure(s) (LRB): CYSTOSCOPY WITH BILATERAL RETROGRADE PYELOGRAM (Bilateral)  Patient location during evaluation: PACU Anesthesia Type: General Level of consciousness: awake and alert Pain management: pain level controlled Vital Signs Assessment: post-procedure vital signs reviewed and stable Respiratory status: spontaneous breathing, nonlabored ventilation, respiratory function stable and patient connected to nasal cannula oxygen Cardiovascular status: blood pressure returned to baseline and stable Postop Assessment: no signs of nausea or vomiting Anesthetic complications: no    Last Vitals:  Filed Vitals:   04/11/16 1315 04/11/16 1330  BP: 139/66 149/55  Pulse: 60 56  Temp:  36.6 C  Resp: 16 16    Last Pain:  Filed Vitals:   04/11/16 1336  PainSc: 0-No pain                 [name]

## 2016-04-11 NOTE — Transfer of Care (Signed)
Immediate Anesthesia Transfer of Care Note  Patient: Laurie Pugh  Procedure(s) Performed: Procedure(s): CYSTOSCOPY WITH BILATERAL RETROGRADE PYELOGRAM (Bilateral)  Patient Location: PACU  Anesthesia Type:General  Level of Consciousness: Patient easily awoken, sedated, comfortable, cooperative, following commands, responds to stimulation.   Airway & Oxygen Therapy: Patient spontaneously breathing, ventilating well, oxygen via simple oxygen mask.  Post-op Assessment: Report given to PACU RN, vital signs reviewed and stable, moving all extremities.   Post vital signs: Reviewed and stable.  Complications: No apparent anesthesia complications

## 2016-04-11 NOTE — Discharge Instructions (Signed)
Cystoscopy, Care After Refer to this sheet in the next few weeks. These instructions provide you with information on caring for yourself after your procedure. Your caregiver may also give you more specific instructions. Your treatment has been planned according to current medical practices, but problems sometimes occur. Call your caregiver if you have any problems or questions after your procedure. HOME CARE INSTRUCTIONS  Things you can do to ease any discomfort after your procedure include: 1. Drinking enough water and fluids to keep your urine clear or pale yellow. 2. Taking a warm bath to relieve any burning feelings. SEEK IMMEDIATE MEDICAL CARE IF:  1. You have an increase in blood in your urine. 2. You notice blood clots in your urine. 3. You have difficulty passing urine. 4. You have the chills. 5. You have abdominal pain. 6. You have a fever or persistent symptoms for more than 2-3 days. 7. You have a fever and your symptoms suddenly get worse. MAKE SURE YOU:  1. Understand these instructions. 2. Will watch your condition. 3. Will get help right away if you are not doing well or get worse.   This information is not intended to replace advice given to you by your health care provider. Make sure you discuss any questions you have with your health care provider.   Document Released: 04/01/2005 Document Revised: 10/03/2014 Document Reviewed: 03/05/2012 Elsevier Interactive Patient Education 2016 Else[employer] Inc[employer]  General Anesthesia, Adult, Care After Refer to this sheet in the next few weeks. These instructions provide you with information on caring for yourself after your procedure. Your health care provider may also give you more specific instructions. Your treatment has been planned according to current medical practices, but problems sometimes occur. Call your health care provider if you have any problems or questions after your procedure. WHAT TO EXPECT AFTER THE PROCEDURE After the  procedure, it is typical to experience: 3. Sleepiness. 4. Nausea and vomiting. HOME CARE INSTRUCTIONS 8. For the first 24 hours after general anesthesia: 1. Have a responsible person with you. 2. Do not drive a car. If you are alone, do not take public transportation. 3. Do not drink alcohol. 4. Do not take medicine that has not been prescribed by your health care provider. 5. Do not sign important papers or make important decisions. 6. You may resume a normal diet and activities as directed by your health care provider. 9. Change bandages (dressings) as directed. 10. If you have questions or problems that seem related to general anesthesia, call the hospital and ask for the anesthetist or anesthesiologist on call. SEEK MEDICAL CARE IF: 4. You have nausea and vomiting that continue the day after anesthesia. 5. You develop a rash. SEEK IMMEDIATE MEDICAL CARE IF:  1. You have difficulty breathing. 2. You have chest pain. 3. You have any allergic problems.   This information is not intended to replace advice given to you by your health care provider. Make sure you discuss any questions you have with your health care provider.   Document Released: 12/19/2000 Document Revised: 10/03/2014 Document Reviewed: 01/11/2012 Elsevier Interactive Patient Education 2016 Elsevier Inc.  Foley Catheter Care, Adult A Foley catheter is a soft, flexible tube. This tube is placed into your bladder to drain pee (urine). If you go home with this catheter in place, follow the instructions below. TAKING CARE OF THE CATHETER 5. Wash your hands with soap and water. 6. Put soap and water on a clean washcloth.  Clean the skin where the tube  goes into your body.  Clean away from the tube site.  Never wipe toward the tube.  Clean the area using a circular motion.  Remove all the soap. Pat the area dry with a clean towel. For males, reposition the skin that covers the end of the penis (foreskin). 7. Attach  the tube to your leg with tape or a leg strap. Do not stretch the tube tight. If you are using tape, remove any stickiness left behind by past tape you used. 8. Keep the drainage bag below your hips. Keep it off the floor. 9. Check your tube during the day. Make sure it is working and draining. Make sure the tube does not curl, twist, or bend. 10. Do not pull on the tube or try to take it out. TAKING CARE OF THE DRAINAGE BAGS You will have a large overnight drainage bag and a small leg bag. You may wear the overnight bag any time. Never wear the small bag at night. Follow the directions below. Emptying the Drainage Bag Empty your drainage bag when it is  - full or at least 2-3 times a day. 11. Wash your hands with soap and water. 12. Keep the drainage bag below your hips. 13. Hold the dirty bag over the toilet or clean container. 14. Open the pour spout at the bottom of the bag. Empty the pee into the toilet or container. Do not let the pour spout touch anything. 15. Clean the pour spout with a gauze pad or cotton ball that has rubbing alcohol on it. 16. Close the pour spout. 17. Attach the bag to your leg with tape or a leg strap. 18. Wash your hands well. Changing the Drainage Bag Change your bag once a month or sooner if it starts to smell or look dirty.  6. Wash your hands with soap and water. 7. Pinch the rubber tube so that pee does not spill out. 8. Disconnect the catheter tube from the drainage tube at the connection valve. Do not let the tubes touch anything. 9. Clean the end of the catheter tube with an alcohol wipe. Clean the end of a the drainage tube with a different alcohol wipe. 10. Connect the catheter tube to the drainage tube of the clean drainage bag. 11. Attach the new bag to the leg with tape or a leg strap. Avoid attaching the new bag too tightly. 12. Wash your hands well. Cleaning the Drainage Bag 4. Wash your hands with soap and water. 5. Wash the bag in warm,  soapy water. 6. Rinse the bag with warm water. 7. Fill the bag with a mixture of white vinegar and water (1 cup vinegar to 1 quart warm water [.2 liter vinegar to 1 liter warm water]). Close the bag and soak it for 30 minutes in the solution. 8. Rinse the bag with warm water. 9. Hang the bag to dry with the pour spout open and hanging downward. 10. Store the clean bag (once it is dry) in a clean plastic bag. 11. Wash your hands well. PREVENT INFECTION  Wash your hands before and after touching your tube.  Take showers every day. Wash the skin where the tube enters your body. Do not take baths. Replace wet leg straps with dry ones, if this applies.  Do not use powders, sprays, or lotions on the genital area. Only use creams, lotions, or ointments as told by your doctor.  For females, wipe from front to back after going to the bathroom.  Drink enough fluids to keep your pee clear or pale yellow unless you are told not to have too much fluid (fluid restriction).  Do not let the drainage bag or tubing touch or lie on the floor.  Wear cotton underwear to keep the area dry. GET HELP IF:  Your pee is cloudy or smells unusually bad.  Your tube becomes clogged.  You are not draining pee into the bag or your bladder feels full.  Your tube starts to leak. GET HELP RIGHT AWAY IF:  You have pain, puffiness (swelling), redness, or yellowish-white fluid (pus) where the tube enters the body.  You have pain in the belly (abdomen), legs, lower back, or bladder.  You have a fever.  You see blood fill the tube, or your pee is pink or red.  You feel sick to your stomach (nauseous), throw up (vomit), or have chills.  Your tube gets pulled out. MAKE SURE YOU:   Understand these instructions.  Will watch your condition.  Will get help right away if you are not doing well or get worse.   This information is not intended to replace advice given to you by your health care provider. Make sure  you discuss any questions you have with your health care provider.   Document Released: 01/07/2013 Document Revised: 10/03/2014 Document Reviewed: 01/07/2013 Elsevier Interactive Patient Education [employer] Elsevier Inc.

## 2016-04-11 NOTE — Op Note (Signed)
Date of procedure: 04/11/2016  Preoperative diagnosis:  1. Gross hematuria   Postoperative diagnosis:  1. Gross hematuria 2. Bladder stones  3. Cystocele  Procedure: 1. Cystoscopy 2. Bilateral retrograde pyelograms with interpretation 3. Evacuation of bladder stones  Surgeon: Baruch Gouty, MD  Anesthesia: General  Complications: None  Intraoperative findings: The patient had small less than 2 cm bladder stones were evacuated. Bilateral retrograde pyelogram for unremarkable. She also had a cystocele past level of the hymen.  EBL: None  Specimens: Bladder stones office lab for analysis  Drains: None  Disposition: Stable to the postanesthesia care unit  Indication for procedure: The patient is a 82 y.o. female with gross hematuria, cystocele, bladder stones. She also for cystoscopy and bilateral retrograde polygrams as she is unable to tolerate IV contrast. She also has nonobstructing 1 mm calculus..  After reviewing the management options for treatment, the patient elected to proceed with the above surgical procedure(s). We have discussed the potential benefits and risks of the procedure, side effects of the proposed treatment, the likelihood of the patient achieving the goals of the procedure, and any potential problems that might occur during the procedure or recuperation. Informed consent has been obtained.  Description of procedure: The patient was met in the preoperative area. All risks, benefits, and indications of the procedure were described in great detail. The patient consented to the procedure. Preoperative antibiotics were given. The patient was taken to the operative theater. General anesthesia was induced per the anesthesia service. The patient was then placed in the dorsal lithotomy position and prepped and draped in the usual sterile fashion. A preoperative timeout was called.   A 20 French 30 cystoscope was inserted per urethra atraumatically. It was noted this  time that she had a cystocele past level of the hymen. Pan cystoscopy revealed multiple less than 2 mm bladder stones which were evacuated. Bilateral retrograde powder was obtained with some difficulty due to her cystocele. These were unremarkable. There are no masses or filling defects. Post drainage films showed good drainage of contrast. Overall her procedure today was unremarkable except for the bladder stones which is the likely source for hematuria.  Plan: She'll follow-up in the near future with Dr. dull stent who will perform a pessary fitting. She'll see me in 6 months for repeat renal ultrasound to monitor her cyst which cannot be better characterized due to her IV contrast allergy.  Baruch Gouty, M.D.

## 2016-04-11 NOTE — H&P (Signed)
CC: I have pain in the bladder.  HPI: Laurie Pugh is a 82 year-old female patient who is here for bladder pain.  The patient states the nature of her problem(s) is pain. She does not have urinary urgency. She denies leakage with minimal activity. She does not have total incontinence. She does not have urge incontinence. She does not lose urine at the sight/sound of running water. She denies bed wetting. She sometimes has urinary hesitancy. She often strains to urinate . She often has poor urinary flow. She often has intermittency. She often has a feeling of incomplete bladder emptying. She can feel when her bladder is full. She does have pain with bladder filling. She does have a sensation of prolapse. She does notice protrusion from the vagina. She has not used a pessary. She does not have to splint to urinate.   Patient has to Strain to urinate. She also has symptoms of urinary tract infections every 2 weeks. Most the time she does have negative cultures during this time. She also has a history of an anterior repair many years ago. She feels that her bladder is falling out currently. She also has microscopic hematuria with a negative U/a.     CC: I have blood in my urine.  HPI: She did see the blood in her urine. She has not seen blood clots.   She does have a burning sensation when she urinates. She is currently having trouble urinating.   She has not had kidney stones. She is having pain. She has not recently had unwanted weight loss.     ALLERGIES: Codeine Derivatives Erythromycin Derivatives    MEDICATIONS: Atenolol 25 mg tablet  Benazepril Hcl 20 mg tablet  Caltrate 600 + D 600-200 MG-IU TABS Oral  Crestor 10 MG Oral Tablet Oral  Eliquis 5 mg tablet  Gabapentin 100 mg capsule  LORazepam 1 MG Oral Tablet Oral     GU PSH: Hysterectomy Unilat SO - 2007      PSH Notes: Hysterectomy   NON-GU PSH: Appendectomy Breast Surgery Procedure, Left, to remove benign growth    GU  PMH: Nocturia, Nocturia - 2014 Urinary Tract Inf, Unspec site, Urinary tract infection - 2014    NON-GU PMH: Personal history of other endocrine, nutritional and metabolic disease, History of hypercholesterolemia - 2014 Personal history of other specified conditions, History of heartburn - 2014 Unspecified atrial fibrillation, Atrial Fibrillation - 2014 Encounter for general adult medical examination without abnormal findings, Encounter for preventive health examination    FAMILY HISTORY: father deceased at age 80 - Father mother deceased at age 80 - Mother   SOCIAL HISTORY: Marital Status: Widowed Current Smoking Status: Patient does not smoke anymore. Has not smoked since 03/26/2005. Smoked for 25 years. Smoked 1 pack per day.  Has never drank.  Does not drink caffeine.     Notes: Tobacco Use, Caffeine Use, Death In The Family Mother, Marital History - Widowed, Death In The Family Father   REVIEW OF SYSTEMS:    GU Review Female:   Patient reports burning /pain with urination, get up at night to urinate, stream starts and stops, trouble starting your stream, and have to strain to urinate. Patient denies frequent urination, hard to postpone urination, leakage of urine, and currently pregnant.  Gastrointestinal (Upper):   Patient reports indigestion/ heartburn. Patient denies nausea and vomiting.  Gastrointestinal (Lower):   Patient denies diarrhea and constipation.  Constitutional:   Patient denies fever, night sweats, weight loss, and fatigue.  Skin:   Patient denies skin rash/ lesion and itching.  Eyes:   Patient reports blurred vision. Patient denies double vision.  Ears/ Nose/ Throat:   Patient reports sinus problems. Patient denies sore throat.  Hematologic/Lymphatic:   Patient denies swollen glands and easy bruising.  Cardiovascular:   Patient denies leg swelling and chest pains.  Respiratory:   Patient reports cough. Patient denies shortness of breath.  Endocrine:   Patient denies  excessive thirst.  Musculoskeletal:   Patient denies back pain and joint pain.  Neurological:   Patient reports dizziness. Patient denies headaches.  Psychologic:   Patient denies depression and anxiety.   VITAL SIGNS:      04/04/2016 08:22 AM  Weight 134 lb / 60.78 kg  Height 63 in / 160.02 cm  BP 187/71 mmHg  Pulse 50 /min  Temperature 98.2 F / 37 C  BMI 23.7 kg/m   GU PHYSICAL EXAMINATION:    External Genitalia: No hirsutism, no rash, no scarring, no cyst, no erythematous lesion, no papular lesion, no blanched lesion, no warty lesion. No edema.  Urethral Meatus: Normal size. Normal position. No discharge.  Urethra: No tenderness, no mass, no scarring. No hypermobility. No leakage.  Bladder: Normal to palpation, no tenderness, no mass, normal size.  Vagina: Moderate cystocele. No atrophy, no stenosis. No rectocele. No enterocele. Cystocele distal to level of hymen  Cervix: S/P Hysterectomy  Uterus: S/P Hysterectomy  Anus and Perineum: No hemorrhoids. No anal stenosis. No rectal fissure, no anal fissure. No edema, no dimple, no perineal tenderness, no anal tenderness.   MULTI-SYSTEM PHYSICAL EXAMINATION:    Constitutional: Well-nourished. No physical deformities. Normally developed. Good grooming.  Neck: Neck symmetrical, not swollen. Normal tracheal position.  Respiratory: No labored breathing, no use of accessory muscles.   Cardiovascular: Normal temperature, normal extremity pulses, no swelling, no varicosities.  Lymphatic: No enlargement of neck, axillae, groin.  Skin: No paleness, no jaundice, no cyanosis. No lesion, no ulcer, no rash.  Neurologic / Psychiatric: Oriented to time, oriented to place, oriented to person. No depression, no anxiety, no agitation.  Gastrointestinal: No mass, no tenderness, no rigidity, non obese abdomen.  Eyes: Normal conjunctivae. Normal eyelids.  Ears, Nose, Mouth, and Throat: Left ear no scars, no lesions, no masses. Right ear no scars, no  lesions, no masses. Nose no scars, no lesions, no masses. Normal hearing. Normal lips.  Musculoskeletal: Normal gait and station of head and neck.     PAST DATA REVIEWED:  Source Of History:  Patient  Records Review:   Previous Doctor Records, Previous Patient Records  Urine Test Review:   Urinalysis, Urine Culture and Sensitivity  Urodynamics Review:   Review Bladder Scan   PROCEDURES:           PVR Ultrasound - 16109  Scanned Volume: 71 cc         Urinalysis w/Scope - 81001 Dipstick Dipstick Cont'd Micro  Specimen: Voided Bilirubin: Neg WBC/hpf: 0-5/hpf  Color: Straw Ketones: Neg RBC/hpf: 0-2/hpf  Appearance: Clear Blood: Neg Bacteria: rare 0-9  Specific Gravity: 1.010 Protein: Neg Cystals: NS (Not Seen)  pH: 7.0 Urobilinogen: 0.2 Casts: NS (Not Seen)  Glucose: Neg Nitrites: Neg Trichomonas: Not Present    Leukocyte Esterase: Trace Mucous: Not Present      Epithelial Cells: 0-5/hpf      Yeast: NS (Not Seen)      Sperm: Not Present    ASSESSMENT:      ICD-10 Details  1 GU:   Pelvic  and perineal pain - R10.2   2   Gross hematuria - R31.0    PLAN:           Orders Labs Urine Cytology  Lab Notes: if lab is scheduled at same time interval of office visit, please have labs completed 1 week prior to return visit   X-Ray Notes: 04/08/16 History: Gross Hematuria  Hematuria: Yes  Patient to see MD after exam: No  Previous exam: None  When:  Where:  Diabetic: No  BUN/ Creatinine: NA  Date of last BUN Creatinine:  Weight in pounds: 134 lbs  Allergy- IV Contrast: Yes Heart Races  Conflicting diabetic meds: No  Diabetic Meds:  Prior Authorization #: [id number]           Schedule X-Rays: 1 Week - [facility] Protocol Without Contrast  Return Visit: Next Available Appointment - [facility] Protocol  Procedure: Approximately 1 Week - Cystoscopy Retrogrades - [phone], bilateral          Document Letter(s):  Created for Patient: Clinical Summary    The risks,  benefits, and some of the possible complications of the proposed procedure were discussed with the patient at length and in detail including the possible need for a bladder biopsy, a urethral biopsy, retrograde pyelograms, resection of a bladder lesion, dilation of the urethra, a postoperative a catheter, placement of a ureteral stent, and others. The possible need for postoperative treatments including further surgical procedures was discussed with the patient.   The general risks of the operative procedure and the perioperative period were discussed with the patient at length and in detail including swelling, pain, nausea, vomiting, fever, chills, infection, wound infection, sepsis, renal failure, internal or external bleeding, intraoperative bowel, organ or vascular injuries, postoperative formation of scar tissue, the need for blood transfusions, deep venous thrombosis or blood clots, pulmonary embolus, pneumonia, respiratory failure, heart attack, stroke, death and others.   All of the patient's questions were answered and he voiced an understanding of these risks, benefits and possible complications. The patient gave fully informed consent to proceed with the procedure.          Notes:   I had a long talk with the patient regarding hergross hematuria. Given her smoking history, I believe she needs a hematuria workup. She is allergic to contrast dye. We have set her up to undergo a CT [place] protocol followed by cystoscopy with retrograde pyelogram in the operating room.   She also has dysuria, difficulty voiding/straing, and a cystocele. If her hematuria workup is negative, I believe she would benefit from a pessary. We will address this after the hematuria workup.

## 2016-04-11 NOTE — Anesthesia Preprocedure Evaluation (Signed)
Anesthesia Evaluation  Patient identified by MRN, date of birth, ID band Patient awake    Reviewed: Allergy & Precautions, NPO status , Patient's Chart, lab work & pertinent test results  Airway Mallampati: I  TM Distance: >3 FB Neck ROM: Full    Dental   Pulmonary former smoker,    Pulmonary exam normal        Cardiovascular hypertension, Pt. on medications Normal cardiovascular exam+ dysrhythmias Atrial Fibrillation      Neuro/Psych    GI/Hepatic   Endo/Other    Renal/GU      Musculoskeletal   Abdominal   Peds  Hematology   Anesthesia Other Findings   Reproductive/Obstetrics                             Anesthesia Physical Anesthesia Plan  ASA: III  Anesthesia Plan: General   Post-op Pain Management:    Induction: Intravenous  Airway Management Planned: LMA  Additional Equipment:   Intra-op Plan:   Post-operative Plan: Extubation in OR  Informed Consent: I have reviewed the patients History and Physical, chart, labs and discussed the procedure including the risks, benefits and alternatives for the proposed anesthesia with the patient or authorized representative who has indicated his/her understanding and acceptance.     Plan Discussed with: CRNA and Surgeon  Anesthesia Plan Comments:         Anesthesia Quick Evaluation

## 2016-04-11 NOTE — Anesthesia Procedure Notes (Signed)
Procedure Name: LMA Insertion Date/Time: 04/11/2016 11:22 AM Performed by: Ludwig LeanJONES, Brogan England C Pre-anesthesia Checklist: Patient identified, Emergency Drugs available, Suction available and Patient being monitored Patient Re-evaluated:Patient Re-evaluated prior to inductionOxygen Delivery Method: Circle system utilized Preoxygenation: Pre-oxygenation with 100% oxygen Intubation Type: IV induction Ventilation: Mask ventilation without difficulty LMA: LMA inserted LMA Size: 4.0 Number of attempts: 1 Placement Confirmation: positive ETCO2 and breath sounds checked- equal and bilateral Tube secured with: Tape Dental Injury: Teeth and Oropharynx as per pre-operative assessment

## 2016-04-11 NOTE — Progress Notes (Addendum)
Patient voided 3 times post surgery. Stated she felt a lot of pressure. Bladder scanned patient resulting 462 mL. Patient voided again. Bladder scanned patient resulting 427 mL.  Paged Dr. Sherryl BartersBudzyn. Received order for foley. Patient instructed to follow up with office tomorrow for voiding trial per Dr. Sherryl BartersBudzyn.

## 2016-04-21 ENCOUNTER — Telehealth: Payer: Self-pay | Admitting: Internal Medicine

## 2016-04-21 NOTE — Telephone Encounter (Signed)
Spoke with pt, she has been in atrial fib by her report since Friday. Her heart is racing at time and then flipping and flopping. She notices it esp when she is trying to go to sleep. Since Friday she has taken 1/2 of 25 mg atenolol twice daily. She is aware of her bradycardia issues and know not to take anymore. Offered appt in the atrial fib clinic today or a pa appointment tomorrow at [address]. She declined appointments because she is not able to drive to those locations. She checked her pulse while on the phone with me and it was 75 bpm. She is going to call her medical doctor to see if they can do an ECG today.

## 2016-04-21 NOTE — Telephone Encounter (Signed)
Patient c/o Palpitations:  High priority if patient c/o lightheadedness and shortness of breath.  1. How long have you been having palpitations? Since Fri 7/21  2. Are you currently experiencing lightheadedness and shortness of breath?no  3. Have you checked your BP and heart rate? (document readings)not today   4. Are you experiencing any other symptoms?  no

## 2016-04-22 NOTE — Telephone Encounter (Signed)
EKG from PCP received.   HR 74bpm  Appears A-Flutter  Patient is on eliquis   Will show to MD when he is in the office

## 2016-04-22 NOTE — Telephone Encounter (Signed)
Thanks .Marland Kitchen If she persists, we may need to arrange for cardioversion.  Dr. Rexene Edison

## 2016-11-14 ENCOUNTER — Encounter: Payer: Self-pay | Admitting: Internal Medicine

## 2016-11-14 ENCOUNTER — Ambulatory Visit (INDEPENDENT_AMBULATORY_CARE_PROVIDER_SITE_OTHER): Payer: Medicare Other | Admitting: Internal Medicine

## 2016-11-14 VITALS — BP 144/84 | HR 74 | Ht 63.0 in | Wt 129.0 lb

## 2016-11-14 DIAGNOSIS — I10 Essential (primary) hypertension: Secondary | ICD-10-CM | POA: Diagnosis not present

## 2016-11-14 DIAGNOSIS — E782 Mixed hyperlipidemia: Secondary | ICD-10-CM

## 2016-11-14 DIAGNOSIS — I4891 Unspecified atrial fibrillation: Secondary | ICD-10-CM | POA: Diagnosis not present

## 2016-11-14 NOTE — Patient Instructions (Signed)
Your physician wants you to follow-up in: ONE YEAR WITH DR HILTY You will receive a reminder letter in the mail two months in advance. If you don't receive a letter, please call our office to schedule the follow-up appointment.  If you need a refill on your cardiac medications before your next appointment, please call your pharmacy.   

## 2016-11-15 NOTE — Progress Notes (Signed)
OFFICE NOTE  Chief Complaint:  No complaints  Primary Care Physician: Ezequiel Kayser, MD  HPI:  Laurie Pugh is a pleasant 83 year old female with a history of smoking for 40 years, one pack per day. She quit in 2005 when she underwent heart catheterization. This demonstrated no significant coronary disease, despite significant risk factors. Her family history is significant for mother who had an MI and died at age 31 and her sister who had bypass surgery and died at age 63. Just as a history of atrial fibrillation and this is been ongoing for years. She says she is having symptoms paroxysmal 8 in about 2 weeks ago she noted her heart flipping and flopping out of rhythm. This episode was associated with neck pain which is new diaphoresis and some discomfort as well as shortness of breath. She is concerned about the new symptoms as they may be coronary equivalents. She is on medication for hypertension and dyslipidemia as well as warfarin which he takes for atrial fibrillation.   Based on her symptoms I recommended a LexiScan nuclear stress test. She did undergo this stress test on 07/02/2013. This was negative except for a small area of breast attenuation artifact. She was also placed on a CardioNet monitor which she wore between 06/24/2013 07/08/2013. There was 164 hours a total monitoring time. During that episode there were no rhythms that were self triggered. However the device did not medically trigger for heart rates in the 30s and 40s mostly at night with PVCs. Interestingly she had an episode on 07/04/2013 were she went into a abrupt onset narrow complex supraventricular tachycardia. This persisted for approximately 13 minutes and it was captured to abruptly terminate. She reports she was unaware of this episode. No atrial fibrillation was noted.  Because of her continuing palpitations, I recommended starting beta blocker at her last visit.  Due to cost we switched her to atenolol 25  mg daily, and she noted after taking the first pill that she no longer had any further palpitations. She reports that she has suffered with palpitations for over 10 years and is so pleased that they've actually stopped.  It is noted however that her heart rate is much lower today in the 40's.  She occasionally gets some positional dizziness.  Laurie Pugh called the office the other day and reported that she felt weak and that she may be out of rhythm, in A. Fib. I recommended that she made an appointment so we can get an EKG to see if she is truly out of rhythm. When I went into the room today asked her she felt that she was out of rhythm and she said he has however her EKG did demonstrate sinus rhythm. I think it is a problem and the fact that she is aware of palpitations however these are oftentimes PACs or PVCs but not necessarily A. fib. We have not been able to demonstrate that on monitoring. We have also decreased her dose of atenolol, potentially exposing her to more arrhythmias.  I had the pleasure see Mr. back in the office today. Overall she is doing well denies any chest pain or worsening shortness of breath. She still has intermittent episodes of A. fib, in fact had an episode when she was recently at her primary care doctor's office. Her INR was not well-regulated on warfarin and therefore she was switched to Eliquis which she seems to be tolerating well. She currently has a UTI and is on Cipro.  Apparently she has some degree of a cystocele. She is not positive in a candidate for bladder surgery. I asked her whether or not she was using a pessary, which may be an option for her. She can discuss this further with her primary care doctor.  11/14/2016  Laurie Pugh returns today for follow-up. She's done well over the last year. She remains in A. fib at a rate of 74, but this is thought to be paroxysmal. She takes a look was without any bleeding problems. She is asymptomatic with regards to her A. fib.  She denies any chest pain or worsening shortness of breath.  PMHx:  Past Medical History:  Diagnosis Date  . Angina pectoris, unstable (HCC)   . Atrial fibrillation (HCC)   . Dyslipidemia   . Dysrhythmia   . Hyperlipidemia   . Hypertension   . Neck pain   . PAF (paroxysmal atrial fibrillation) (HCC)   . PSVT (paroxysmal supraventricular tachycardia) (HCC)     Past Surgical History:  Procedure Laterality Date  . ABDOMINAL HYSTERECTOMY    . APPENDECTOMY    . BLADDER SURGERY     bladder surgery  . CARDIAC CATHETERIZATION  06/28/2004   no significant CAD (Dr. Laurell Josephs)  . CYSTOSCOPY W/ RETROGRADES Bilateral 04/11/2016   Procedure: CYSTOSCOPY WITH BILATERAL RETROGRADE PYELOGRAM;  Surgeon: Hildred Laser, MD;  Location: [facility];  Service: Urology;  Laterality: Bilateral;  . TRANSTHORACIC ECHOCARDIOGRAM  2009   borderline conc LVH; trace MR; mild-mod TR, RVSP 30-59mmHg    FAMHx:  Family History  Problem Relation Age of Onset  . Heart disease Mother   . Heart disease Sister   . Suicidality Brother   . Cancer Sister   . COPD Sister   . Suicidality Child     SOCHx:   reports that she quit smoking about 13 years ago. She has a 40.00 pack-year smoking history. She has never used smokeless tobacco. She reports that she does not drink alcohol or use drugs.  ALLERGIES:  Allergies  Allergen Reactions  . Other Other (See Comments)    Mycins - cannot recall whether azithromycin, e-mycin (REACTION: unknown)  . Codeine Nausea Only    ROS: Pertinent items noted in HPI and remainder of comprehensive ROS otherwise negative.  HOME MEDS: Current Outpatient Prescriptions  Medication Sig Dispense Refill  . alendronate (FOSAMAX) 70 MG tablet Take 70 mg by mouth once a week. Take with a full glass of water on an empty stomach.    Marland Kitchen apixaban (ELIQUIS) 5 MG TABS tablet Take 5 mg by mouth 2 (two) times daily.    Marland Kitchen atenolol (TENORMIN) 25 MG tablet Take 1 tablet (25 mg total) by mouth  daily. 90 tablet 3  . benazepril (LOTENSIN) 20 MG tablet Take 1 tablet by mouth daily.    . Calcium Carbonate-Vitamin D (CALTRATE 600+D) 600-400 MG-UNIT per chew tablet Chew 2 tablets by mouth daily.    Marland Kitchen LORazepam (ATIVAN) 1 MG tablet Take 1 mg by mouth 2 (two) times daily.     . rosuvastatin (CRESTOR) 10 MG tablet Take 10 mg by mouth daily.     No current facility-administered medications for this visit.     LABS/IMAGING: No results found for this or any previous visit (from the past 48 hour(s)). No results found.  VITALS: BP (!) 144/84   Pulse 74   Ht 5\' 3"  (1.6 m)   Wt 129 lb (58.5 kg)   BMI 22.85 kg/m   EXAM: GEN:  Awake, in no acute distress HEENT: PERRLA, EOMI Lungs: Clear bilaterally Cardiovascular: irregularly irregular, normal S1-S2, no murmur rub or gallop Abdomen: Soft, nontender Extremities: No edema Neurologic: Nonfocal Psychiatric: Anxious  EKG: Atrial fibrillation at 74  ASSESSMENT: 1. Paroxysmal atrial fibrillation - on Eliquis, CHADSVASC Score of 3 2. Hypertension - generally well controlled 3. Dyslipidemia - on statin 4. Significant past smoking history - quit in 2005 5. Family history of coronary disease  PLAN: 1.   Laurie Pugh is currently in A. fib but was previously in sinus rhythm therefore is paroxysmal. She is asymptomatic with this. She is on Eliquis. She seems to be tolerating with this without any bleeding problems. We'll continue current medications. Follow-up with me annually or sooner as necessary.  Chrystie NoseKenneth C. Hebert Dooling, MD, [facility] Hospital[facility]ACC Attending Cardiologist [facility]  Lisette AbuKenneth C [facility] 11/15/2016, 6:07 PM

## 2016-12-08 ENCOUNTER — Other Ambulatory Visit: Payer: Self-pay | Admitting: Internal Medicine

## 2016-12-08 DIAGNOSIS — Z1231 Encounter for screening mammogram for malignant neoplasm of breast: Secondary | ICD-10-CM

## 2017-01-02 ENCOUNTER — Ambulatory Visit
Admission: RE | Admit: 2017-01-02 | Discharge: 2017-01-02 | Disposition: A | Discharge status: Home / Self Care | Payer: Medicare Other | Source: Ambulatory Visit | Attending: Internal Medicine | Admitting: Internal Medicine

## 2017-01-02 DIAGNOSIS — Z1231 Encounter for screening mammogram for malignant neoplasm of breast: Secondary | ICD-10-CM

## 2017-01-03 ENCOUNTER — Other Ambulatory Visit: Payer: Self-pay | Admitting: Internal Medicine

## 2017-01-03 DIAGNOSIS — R928 Other abnormal and inconclusive findings on diagnostic imaging of breast: Secondary | ICD-10-CM

## 2017-01-09 ENCOUNTER — Ambulatory Visit
Admission: RE | Admit: 2017-01-09 | Discharge: 2017-01-09 | Disposition: A | Discharge status: Home / Self Care | Payer: Medicare Other | Source: Ambulatory Visit | Attending: Internal Medicine | Admitting: Internal Medicine

## 2017-01-09 DIAGNOSIS — R928 Other abnormal and inconclusive findings on diagnostic imaging of breast: Secondary | ICD-10-CM

## 2017-01-11 ENCOUNTER — Other Ambulatory Visit: Payer: Self-pay | Admitting: Internal Medicine

## 2017-01-11 NOTE — Telephone Encounter (Signed)
REFILL 

## 2017-01-29 ENCOUNTER — Emergency Department (HOSPITAL_COMMUNITY): Payer: Medicare Other

## 2017-01-29 ENCOUNTER — Encounter (HOSPITAL_COMMUNITY): Payer: Self-pay | Admitting: Emergency Medicine

## 2017-01-29 ENCOUNTER — Inpatient Hospital Stay (HOSPITAL_COMMUNITY)
Admission: EM | Admit: 2017-01-29 | Discharge: 2017-01-31 | DRG: 392 | Disposition: A | Discharge status: Home / Self Care | Payer: Medicare Other | Attending: Internal Medicine | Admitting: Internal Medicine

## 2017-01-29 DIAGNOSIS — R1310 Dysphagia, unspecified: Secondary | ICD-10-CM

## 2017-01-29 DIAGNOSIS — Z87891 Personal history of nicotine dependence: Secondary | ICD-10-CM

## 2017-01-29 DIAGNOSIS — K222 Esophageal obstruction: Secondary | ICD-10-CM | POA: Diagnosis not present

## 2017-01-29 DIAGNOSIS — E860 Dehydration: Secondary | ICD-10-CM | POA: Diagnosis present

## 2017-01-29 DIAGNOSIS — K449 Diaphragmatic hernia without obstruction or gangrene: Secondary | ICD-10-CM | POA: Diagnosis present

## 2017-01-29 DIAGNOSIS — I4891 Unspecified atrial fibrillation: Secondary | ICD-10-CM | POA: Diagnosis present

## 2017-01-29 DIAGNOSIS — E785 Hyperlipidemia, unspecified: Secondary | ICD-10-CM | POA: Diagnosis present

## 2017-01-29 DIAGNOSIS — I10 Essential (primary) hypertension: Secondary | ICD-10-CM | POA: Diagnosis present

## 2017-01-29 DIAGNOSIS — Z9079 Acquired absence of other genital organ(s): Secondary | ICD-10-CM

## 2017-01-29 DIAGNOSIS — R1319 Other dysphagia: Secondary | ICD-10-CM

## 2017-01-29 DIAGNOSIS — D72828 Other elevated white blood cell count: Secondary | ICD-10-CM | POA: Diagnosis present

## 2017-01-29 DIAGNOSIS — Z885 Allergy status to narcotic agent status: Secondary | ICD-10-CM

## 2017-01-29 DIAGNOSIS — T18128A Food in esophagus causing other injury, initial encounter: Secondary | ICD-10-CM

## 2017-01-29 DIAGNOSIS — K210 Gastro-esophageal reflux disease with esophagitis: Secondary | ICD-10-CM | POA: Diagnosis present

## 2017-01-29 DIAGNOSIS — E876 Hypokalemia: Secondary | ICD-10-CM | POA: Diagnosis present

## 2017-01-29 DIAGNOSIS — Z79899 Other long term (current) drug therapy: Secondary | ICD-10-CM

## 2017-01-29 DIAGNOSIS — I480 Paroxysmal atrial fibrillation: Secondary | ICD-10-CM | POA: Diagnosis present

## 2017-01-29 DIAGNOSIS — E871 Hypo-osmolality and hyponatremia: Secondary | ICD-10-CM | POA: Diagnosis present

## 2017-01-29 DIAGNOSIS — Z7901 Long term (current) use of anticoagulants: Secondary | ICD-10-CM

## 2017-01-29 DIAGNOSIS — Z886 Allergy status to analgesic agent status: Secondary | ICD-10-CM

## 2017-01-29 HISTORY — DX: Gastro-esophageal reflux disease without esophagitis: K21.9

## 2017-01-29 LAB — CBC WITH DIFFERENTIAL/PLATELET
BASOS ABS: 0.1 10*3/uL (ref 0.0–0.1)
BASOS PCT: 1 %
EOS ABS: 0.1 10*3/uL (ref 0.0–0.7)
EOS PCT: 1 %
HEMATOCRIT: 48.3 % — AB (ref 36.0–46.0)
Hemoglobin: 15.9 g/dL — ABNORMAL HIGH (ref 12.0–15.0)
Lymphocytes Relative: 15 %
Lymphs Abs: 2 10*3/uL (ref 0.7–4.0)
MCH: 30 pg (ref 26.0–34.0)
MCHC: 32.9 g/dL (ref 30.0–36.0)
MCV: 91.1 fL (ref 78.0–100.0)
MONO ABS: 1.3 10*3/uL — AB (ref 0.1–1.0)
Monocytes Relative: 10 %
NEUTROS ABS: 9.6 10*3/uL — AB (ref 1.7–7.7)
Neutrophils Relative %: 73 %
PLATELETS: 247 10*3/uL (ref 150–400)
RBC: 5.3 MIL/uL — ABNORMAL HIGH (ref 3.87–5.11)
RDW: 16 % — AB (ref 11.5–15.5)
WBC: 13 10*3/uL — ABNORMAL HIGH (ref 4.0–10.5)

## 2017-01-29 LAB — BASIC METABOLIC PANEL
ANION GAP: 10 (ref 5–15)
BUN: 6 mg/dL (ref 6–20)
CO2: 29 mmol/L (ref 22–32)
Calcium: 8.9 mg/dL (ref 8.9–10.3)
Chloride: 84 mmol/L — ABNORMAL LOW (ref 101–111)
Creatinine, Ser: 0.54 mg/dL (ref 0.44–1.00)
GFR calc Af Amer: >60 mL/min (ref >60)
GLUCOSE: 96 mg/dL (ref 65–99)
POTASSIUM: 4.1 mmol/L (ref 3.5–5.1)
Sodium: 123 mmol/L — ABNORMAL LOW (ref 135–145)

## 2017-01-29 MED ORDER — SODIUM CHLORIDE 0.9 % IV BOLUS (SEPSIS)
1000.0000 mL | Freq: Once | INTRAVENOUS | Status: AC
  Administered 2017-01-29: 1000 mL via INTRAVENOUS

## 2017-01-29 NOTE — ED Notes (Signed)
ED Provider at bedside. 

## 2017-01-29 NOTE — ED Provider Notes (Signed)
[facility]-EMERGENCY DEPT Provider Note   CSN: [id number] Arrival date & time: 01/29/17  2126  By signing my name below, I, Majel HomerPeyton Lee, attest that this documentation has been prepared under the direction and in the presence of non-physician practitioner, Roxy Horsemanobert Abri Vacca, PA-C. Electronically Signed: Majel HomerPeyton Lee, Scribe. 01/29/2017. 11:10 PM.  History   Chief Complaint Chief Complaint  Patient presents with  . Dysphagia   The history is provided by the patient and a relative. No language interpreter was used.   HPI Comments: Laurie Pugh is a 83 y.o. female with PMHx of A-FIb on Eliquis, HTN, and HLD, who presents to the Emergency Department complaining of a gradually worsening, sensation of a foreign object in her throat with difficulty swallowing that began 4 days ago. Pt reports she was eating a hot dog 4 days ago and noticed mild difficulty swallowing "halfway through eating it." She states she has been unable to tolerate any fluids or solid food since this incident with an associated "burning" sensation in her chest and multiple episodes of vomiting secondary to eating or drinking. Per daughter, pt was seen at her PCP office 3 days ago in which she was diagnosed with GERD and prescribed antacids; however, pt has been unable to tolerate her medications. She is concerned as pt has not been able to take her prescribed medications for the past 3 days and has become increasingly weak due to possible dehydration. Pt denies any other complaints.   Past Medical History:  Diagnosis Date  . Angina pectoris, unstable (HCC)   . Atrial fibrillation (HCC)   . Dyslipidemia   . Dysrhythmia   . Hyperlipidemia   . Hypertension   . Neck pain   . PAF (paroxysmal atrial fibrillation) (HCC)   . PSVT (paroxysmal supraventricular tachycardia) [facility](HCC)     Patient Active Problem List   Diagnosis Date Noted  . Sinus bradycardia 05/26/2014  . PVC's (premature ventricular contractions) 05/26/2014  . Paroxysmal  SVT (supraventricular tachycardia) (HCC) 07/16/2013  . A-fib (HCC) 06/24/2013  . Hyperlipidemia 06/24/2013  . Essential hypertension 06/24/2013  . Angina pectoris, unstable (HCC) 06/24/2013  . Neck pain 06/24/2013  . DOE (dyspnea on exertion) 06/24/2013    Past Surgical History:  Procedure Laterality Date  . ABDOMINAL HYSTERECTOMY    . APPENDECTOMY    . BLADDER SURGERY     bladder surgery  . BREAST EXCISIONAL BIOPSY    . CARDIAC CATHETERIZATION  06/28/2004   no significant CAD (Dr. Laurell Josephs. McQueen)  . CYSTOSCOPY W/ RETROGRADES Bilateral 04/11/2016   Procedure: CYSTOSCOPY WITH BILATERAL RETROGRADE PYELOGRAM;  Surgeon: Hildred LaserBrian James Budzyn, MD;  Location: [facility];  Service: Urology;  Laterality: Bilateral;  . TRANSTHORACIC ECHOCARDIOGRAM  2009   borderline conc LVH; trace MR; mild-mod TR, RVSP 30-4240mmHg    OB History    No data available     Home Medications    Prior to Admission medications   Medication Sig Start Date End Date Taking? Authorizing Provider  alendronate (FOSAMAX) 70 MG tablet Take 70 mg by mouth once a week. Take with a full glass of water on an empty stomach.    [provider]  apixaban (ELIQUIS) 5 MG TABS tablet Take 5 mg by mouth 2 (two) times daily.    [provider]  atenolol (TENORMIN) 25 MG tablet TAKE 1 TABLET BY MOUTH  DAILY 01/11/17   Hilty, Lisette AbuKenneth C, MD  benazepril (LOTENSIN) 20 MG tablet Take 1 tablet by mouth daily. 05/30/13   [provider]  Calcium Carbonate-Vitamin D (CALTRATE 600+D) 600-400 MG-UNIT per chew tablet Chew 2 tablets by mouth daily.    [provider]  LORazepam (ATIVAN) 1 MG tablet Take 1 mg by mouth 2 (two) times daily.  05/30/13   [provider]  rosuvastatin (CRESTOR) 10 MG tablet Take 10 mg by mouth daily.    [provider]    Family History Family History  Problem Relation Age of Onset  . Heart disease Mother   . Heart disease Sister   . Suicidality Brother   . Cancer Sister     . COPD Sister   . Suicidality Child     Social History Social History  Substance Use Topics  . Smoking status: Former Smoker    Packs/day: 1.00    Years: 40.00    Quit date: 06/25/2003  . Smokeless tobacco: Never Used  . Alcohol use No   Allergies   Other and Codeine  Review of Systems Review of Systems  Constitutional: Positive for appetite change.  HENT: Positive for trouble swallowing.        +sensation of foreign object in throat  Cardiovascular: Positive for chest pain ("burning" sensation in chest).  Gastrointestinal: Positive for vomiting.  Neurological: Positive for weakness.   Physical Exam Updated Vital Signs BP (!) 108/58 (BP Location: Right Arm)   Pulse 60   Temp 97.9 F (36.6 C) (Oral)   Resp 18   Ht 5\' 3"  (1.6 m)   Wt 128 lb (58.1 kg)   SpO2 94%   BMI 22.67 kg/m   Physical Exam  Constitutional: She is oriented to person, place, and time. She appears well-developed and well-nourished. No distress.  HENT:  Head: Normocephalic and atraumatic.  Eyes: EOM are normal.  Neck: Normal range of motion.  Cardiovascular: Normal rate, regular rhythm and normal heart sounds.   Pulmonary/Chest: Effort normal and breath sounds normal.  Abdominal: Soft. She exhibits no distension. There is no tenderness.  Musculoskeletal: Normal range of motion.  Neurological: She is alert and oriented to person, place, and time.  Skin: Skin is warm and dry.  Psychiatric: She has a normal mood and affect. Judgment normal.  Nursing note and vitals reviewed.  ED Treatments / Results  DIAGNOSTIC STUDIES:  Oxygen Saturation is 94% on RA, normal by my interpretation.    COORDINATION OF CARE:  10:42 PM Discussed treatment plan with pt at bedside and pt agreed to plan.    Labs (all labs ordered are listed, but only abnormal results are displayed) Labs Reviewed  CBC WITH DIFFERENTIAL/PLATELET - Abnormal; Notable for the following:       Result Value   WBC 13.0 (*)    RBC  5.30 (*)    Hemoglobin 15.9 (*)    HCT 48.3 (*)    RDW 16.0 (*)    Neutro Abs 9.6 (*)    Monocytes Absolute 1.3 (*)    All other components within normal limits  BASIC METABOLIC PANEL - Abnormal; Notable for the following:    Sodium 123 (*)    Chloride 84 (*)    All other components within normal limits    EKG  EKG Interpretation None       Radiology Dg Chest 2 View  Result Date: 01/29/2017 CLINICAL DATA:  Acute onset of emesis and mid chest pain. Initial encounter. EXAM: CHEST  2 VIEW COMPARISON:  Chest radiograph performed 03/13/2007 FINDINGS: The lungs are well-aerated. Mild peribronchial thickening is noted. There is no evidence of pleural effusion  or pneumothorax. The heart is normal in size; the mediastinal contour is within normal limits. No acute osseous abnormalities are seen. A moderate hiatal hernia is again noted, partially filled with air. IMPRESSION: 1. Mild peribronchial thickening noted. Lungs otherwise grossly clear. 2. Moderate hiatal hernia again noted, partially filled with air. Electronically Signed   By: Roanna Raider M.D.   On: 01/29/2017 23:49    Procedures Procedures (including critical care time)  Medications Ordered in ED Medications  sodium chloride 0.9 % bolus 1,000 mL (1,000 mLs Intravenous New Bag/Given 01/29/17 2307)    Initial Impression / Assessment and Plan / ED Course  I have reviewed the triage vital signs and the nursing notes.  Pertinent labs & imaging results that were available during my care of the patient were reviewed by me and considered in my medical decision making (see chart for details).     Patient with apparent esophageal food bolus impaction. Swallowed a hot dog 4 days ago and it seems stuck.  Unable to keep anything down including fluids. Labs concerning for dehydration, will give fluids. Discussed with Dr. Denton Lank, who recommends GI consult.  Appreciate Dr. Lavon Paganini for taking patient for endoscopy, recommends CXR to rule  out perf.  1:58 AM Dr. Lavon Paganini notifies me that she was unable to clear the obstruction and recommends gastrografin study and medicine admission.  Discussed the case with Dr. Julian Reil, who will admit the patient.     Final Clinical Impressions(s) / ED Diagnoses   Final diagnoses:  Esophageal obstruction due to food impaction   I personally performed the services described in this documentation, which was scribed in my presence. The recorded information has been reviewed and is accurate.    New Prescriptions New Prescriptions   No medications on file     Roxy Horseman, Cordelia Poche 01/30/17 0159    Cathren Laine, MD 02/06/17 [phone]

## 2017-01-29 NOTE — ED Triage Notes (Addendum)
Patient ate a Patient is complaining of throwing up since Thursday. Patient states that she is getting fluid, food, and medication stuck in the bottom of her throat that started Thursday. Patient has not hardly eat since Thursday. Patient states that her mid chest burns also. This all started after eating a hot dog on Wednesday.

## 2017-01-29 NOTE — ED Notes (Signed)
Pt states she has been having difficulty swallowing since Wednesday. Pt states "it is like nothing can get all the way down her throat." Pt is talking without difficulty but states she cannot swallow foods, fluids or her PO medications.

## 2017-01-30 ENCOUNTER — Encounter (HOSPITAL_COMMUNITY)
Admission: EM | Disposition: A | Discharge status: Home / Self Care | Payer: Self-pay | Source: Home / Self Care | Attending: Internal Medicine

## 2017-01-30 ENCOUNTER — Observation Stay (HOSPITAL_COMMUNITY): Payer: Medicare Other | Admitting: Certified Registered"

## 2017-01-30 ENCOUNTER — Observation Stay (HOSPITAL_COMMUNITY): Payer: Medicare Other

## 2017-01-30 ENCOUNTER — Encounter (HOSPITAL_COMMUNITY): Payer: Self-pay

## 2017-01-30 DIAGNOSIS — I480 Paroxysmal atrial fibrillation: Secondary | ICD-10-CM

## 2017-01-30 DIAGNOSIS — D72828 Other elevated white blood cell count: Secondary | ICD-10-CM | POA: Diagnosis present

## 2017-01-30 DIAGNOSIS — K210 Gastro-esophageal reflux disease with esophagitis: Secondary | ICD-10-CM | POA: Diagnosis present

## 2017-01-30 DIAGNOSIS — F419 Anxiety disorder, unspecified: Secondary | ICD-10-CM | POA: Diagnosis not present

## 2017-01-30 DIAGNOSIS — T18128A Food in esophagus causing other injury, initial encounter: Secondary | ICD-10-CM

## 2017-01-30 DIAGNOSIS — Z79899 Other long term (current) drug therapy: Secondary | ICD-10-CM | POA: Diagnosis not present

## 2017-01-30 DIAGNOSIS — Z885 Allergy status to narcotic agent status: Secondary | ICD-10-CM | POA: Diagnosis not present

## 2017-01-30 DIAGNOSIS — E871 Hypo-osmolality and hyponatremia: Secondary | ICD-10-CM | POA: Diagnosis present

## 2017-01-30 DIAGNOSIS — Z7901 Long term (current) use of anticoagulants: Secondary | ICD-10-CM | POA: Diagnosis not present

## 2017-01-30 DIAGNOSIS — K222 Esophageal obstruction: Secondary | ICD-10-CM

## 2017-01-30 DIAGNOSIS — E876 Hypokalemia: Secondary | ICD-10-CM | POA: Diagnosis present

## 2017-01-30 DIAGNOSIS — Z886 Allergy status to analgesic agent status: Secondary | ICD-10-CM | POA: Diagnosis not present

## 2017-01-30 DIAGNOSIS — R1310 Dysphagia, unspecified: Secondary | ICD-10-CM | POA: Diagnosis not present

## 2017-01-30 DIAGNOSIS — I10 Essential (primary) hypertension: Secondary | ICD-10-CM | POA: Diagnosis present

## 2017-01-30 DIAGNOSIS — E785 Hyperlipidemia, unspecified: Secondary | ICD-10-CM | POA: Diagnosis present

## 2017-01-30 DIAGNOSIS — Z9079 Acquired absence of other genital organ(s): Secondary | ICD-10-CM | POA: Diagnosis not present

## 2017-01-30 DIAGNOSIS — E860 Dehydration: Secondary | ICD-10-CM | POA: Diagnosis present

## 2017-01-30 DIAGNOSIS — K449 Diaphragmatic hernia without obstruction or gangrene: Secondary | ICD-10-CM | POA: Diagnosis present

## 2017-01-30 DIAGNOSIS — R1319 Other dysphagia: Secondary | ICD-10-CM

## 2017-01-30 DIAGNOSIS — Z87891 Personal history of nicotine dependence: Secondary | ICD-10-CM | POA: Diagnosis not present

## 2017-01-30 HISTORY — PX: ESOPHAGOGASTRODUODENOSCOPY: SHX5428

## 2017-01-30 SURGERY — EGD (ESOPHAGOGASTRODUODENOSCOPY)
Anesthesia: Moderate Sedation

## 2017-01-30 SURGERY — EGD (ESOPHAGOGASTRODUODENOSCOPY)
Anesthesia: Monitor Anesthesia Care

## 2017-01-30 MED ORDER — PROPOFOL 10 MG/ML IV BOLUS
INTRAVENOUS | Status: AC
  Filled 2017-01-30: qty 20

## 2017-01-30 MED ORDER — MIDAZOLAM HCL 5 MG/ML IJ SOLN
INTRAMUSCULAR | Status: AC
  Filled 2017-01-30: qty 2

## 2017-01-30 MED ORDER — MORPHINE SULFATE (PF) 4 MG/ML IV SOLN
2.0000 mg | Freq: Once | INTRAVENOUS | Status: AC
  Administered 2017-01-30: 2 mg via INTRAVENOUS
  Filled 2017-01-30: qty 1

## 2017-01-30 MED ORDER — SODIUM CHLORIDE 0.9 % IV SOLN
INTRAVENOUS | Status: DC
  Administered 2017-01-31 (×2): via INTRAVENOUS

## 2017-01-30 MED ORDER — PROPOFOL 10 MG/ML IV BOLUS
INTRAVENOUS | Status: DC | PRN
  Administered 2017-01-30 (×3): 10 mg via INTRAVENOUS
  Administered 2017-01-30 (×2): 20 mg via INTRAVENOUS

## 2017-01-30 MED ORDER — MIDAZOLAM HCL 10 MG/2ML IJ SOLN
INTRAMUSCULAR | Status: DC | PRN
  Administered 2017-01-30: 1 mg via INTRAVENOUS
  Administered 2017-01-30: 2 mg via INTRAVENOUS
  Administered 2017-01-30 (×2): 1 mg via INTRAVENOUS

## 2017-01-30 MED ORDER — IOPAMIDOL (ISOVUE-300) INJECTION 61%
INTRAVENOUS | Status: AC
  Administered 2017-01-30: 70 mL
  Filled 2017-01-30: qty 150

## 2017-01-30 MED ORDER — PHENOL 1.4 % MT LIQD
1.0000 | OROMUCOSAL | Status: DC | PRN
  Filled 2017-01-30: qty 177

## 2017-01-30 MED ORDER — BUTAMBEN-TETRACAINE-BENZOCAINE 2-2-14 % EX AERO
INHALATION_SPRAY | CUTANEOUS | Status: DC | PRN
  Administered 2017-01-30: 2 via TOPICAL

## 2017-01-30 MED ORDER — SODIUM CHLORIDE 0.9 % IV SOLN
INTRAVENOUS | Status: DC
  Administered 2017-01-30 (×2): via INTRAVENOUS

## 2017-01-30 MED ORDER — LIDOCAINE 2% (20 MG/ML) 5 ML SYRINGE
INTRAMUSCULAR | Status: AC
  Filled 2017-01-30: qty 5

## 2017-01-30 MED ORDER — METOPROLOL TARTRATE 5 MG/5ML IV SOLN
2.5000 mg | Freq: Four times a day (QID) | INTRAVENOUS | Status: DC
  Administered 2017-01-30 – 2017-01-31 (×3): 2.5 mg via INTRAVENOUS
  Filled 2017-01-30 (×3): qty 5

## 2017-01-30 MED ORDER — LORAZEPAM 2 MG/ML IJ SOLN
0.5000 mg | Freq: Once | INTRAMUSCULAR | Status: AC
  Administered 2017-01-30: 0.5 mg via INTRAVENOUS
  Filled 2017-01-30: qty 1

## 2017-01-30 MED ORDER — MORPHINE SULFATE (PF) 4 MG/ML IV SOLN: 2.0000 mg | INTRAVENOUS | Status: DC | PRN

## 2017-01-30 MED ORDER — FENTANYL CITRATE (PF) 100 MCG/2ML IJ SOLN
INTRAMUSCULAR | Status: DC | PRN
  Administered 2017-01-30 (×2): 25 ug via INTRAVENOUS

## 2017-01-30 MED ORDER — FENTANYL CITRATE (PF) 100 MCG/2ML IJ SOLN
INTRAMUSCULAR | Status: AC
  Filled 2017-01-30: qty 2

## 2017-01-30 MED ORDER — DEXTROSE-NACL 5-0.45 % IV SOLN: INTRAVENOUS | Status: DC

## 2017-01-30 NOTE — Progress Notes (Signed)
[facility] Progress Note  Chief Complaint:  dysphagia   Subjective: unable to tolerate any liquids.   Objective:  Vital signs in last 24 hours: Temp:  [97.5 F (36.4 C)-97.9 F (36.6 C)] 97.9 F (36.6 C) (05/07 0602) Pulse Rate:  [60-115] 86 (05/07 0602) Resp:  [16-28] 20 (05/07 0602) BP: (81-128)/(47-83) 102/54 (05/07 0602) SpO2:  [94 %-100 %] 95 % (05/07 0602) Weight:  [124 lb 12.5 oz (56.6 kg)-128 lb (58.1 kg)] 124 lb 12.5 oz (56.6 kg) (05/07 0240) Last BM Date: 01/26/17 General:   Alert, well-developed, white female in NAD EENT:  Normal hearing, non icteric sclera, conjunctive pink.  Heart:  Regular rate and rhythm; no lower extremity edema Pulm: Normal respiratory effort, lungs, a few bibasilar crackles. . Abdomen:  Soft, nondistended, nontender.  Normal bowel sounds, no masses felt. No hepatomegaly.    Neurologic:  Alert and  oriented x4;  grossly normal neurologically. Psych:  Alert and cooperative. Normal mood and affect.    Intake/Output from previous day: 05/06 0701 - 05/07 0700 In: 29.2 [I.V.:29.2] Out: -  Intake/Output this shift: No intake/output data recorded.  Lab Results:  Recent Labs  01/29/17 2245  WBC 13.0*  HGB 15.9*  HCT 48.3*  PLT 247   BMET  Recent Labs  01/29/17 2245  NA 123*  K 4.1  CL 84*  CO2 29  GLUCOSE 96  BUN 6  CREATININE 0.54  CALCIUM 8.9   LFT No results for input(s): PROT, ALBUMIN, AST, ALT, ALKPHOS, BILITOT, BILIDIR, IBILI in the last 72 hours. PT/INR No results for input(s): LABPROT, INR in the last 72 hours. Hepatitis Panel No results for input(s): HEPBSAG, HCVAB, HEPAIGM, HEPBIGM in the last 72 hours.  Dg Chest 2 View  Result Date: 01/29/2017 CLINICAL DATA:  Acute onset of emesis and mid chest pain. Initial encounter. EXAM: CHEST  2 VIEW COMPARISON:  Chest radiograph performed 03/13/2007 FINDINGS: The lungs are well-aerated. Mild peribronchial thickening is noted. There is no evidence of  pleural effusion or pneumothorax. The heart is normal in size; the mediastinal contour is within normal limits. No acute osseous abnormalities are seen. A moderate hiatal hernia is again noted, partially filled with air. IMPRESSION: 1. Mild peribronchial thickening noted. Lungs otherwise grossly clear. 2. Moderate hiatal hernia again noted, partially filled with air. Electronically Signed   By: Roanna Raider M.D.   On: 01/29/2017 23:49   Dg Esophagus W/water Sol Cm  Result Date: 01/30/2017 CLINICAL DATA:  83 year old female inpatient with worsening vomiting and dysphagia. Patient underwent upper endoscopy overnight demonstrating high-grade esophageal stricture, unable to be traversed with pediatric scope. EXAM: ESOPHOGRAM/BARIUM SWALLOW TECHNIQUE: Single contrast examination was performed using thin barium or water soluble. FLUOROSCOPY TIME:  Fluoroscopy Time:  2.6 minutes Radiation Exposure Index (if provided by the fluoroscopic device): 21.0 mGy Number of Acquired Spot Images: 9 COMPARISON:  04/08/2016 CT abdomen/ pelvis. Chest radiograph from one day prior. FINDINGS: Grossly normal swallow function. No evidence of laryngeal penetration or tracheobronchial aspiration. There is a long irregular esophageal stricture involving the lower half of the thoracic esophagus with irregular luminal contour and irregular internal filling defects. The most severe site of narrowing is in the lower thoracic esophagus just above the esophagogastric junction. The upper thoracic esophagus is mildly patulous. There is prominent upper thoracic esophageal dysmotility characterized by proximal escape of the barium bolus and significant weakening of primary peristalsis in the mid to lower thoracic esophagus. No evidence of esophageal leak. There is  a moderate hiatal hernia. IMPRESSION: 1. Long irregular malignant-appearing esophageal stricture involving the lower half of the thoracic esophagus. 2. Moderate hiatal hernia. 3. Grossly  normal swallowing function, with no evidence of laryngeal penetration or tracheobronchial aspiration. Electronically Signed   By: Delbert PhenixJason A Poff M.D.   On: 01/30/2017 10:05    Assessment / Plan:  83 yo female admitted yesterday after presenting with dysphagia / ? Food impaction. She underwent EGD yesterday. No impaction but findings of severe esophageal stenosis associated with severe ulceration . Findings concerning for malignancy. Follow up esophagram today reveals a long irregular malignant appearing esophageal stricture involving lower half of the thoracic esophagus.  -Patient will need repeat EGD for biopsies, none obtained at time of EGD yesterday. She has several family members in room and gave me permission to speak openly in front of them. The risks and benefits of EGD were discussed and the patient agrees to proceed.   2. AFib, CHADVASC 3, Rate controlled. Hasn't been able to tolerate Eliquis since Wed.   Principal Problem:   Esophageal stricture Active Problems:   A-fib (HCC)   LOS: 0 days   Willette ClusterPaula Guenther NP 01/30/2017, 10:33 AM  Pager number [phone]   GI ATTENDING   History , laboratories, x-rays, endoscopy report all reviewed. Patient personallyl seen and  examined. Agree with progress note as outlined above. Suspect  esophageal cancer. For repeat EGD wit biopsies.The nature of the procedure, as well as the risks, benefits, and alternatives were carefully and thoroughly reviewed with the patient. Ample time for discussion and questions allowed. The patient understood, was satisfied, and agreed to proceed.  Wilhemina BonitoJohn N. Eda KeysPerry, Jr., M.D. [facility] of [facility]

## 2017-01-30 NOTE — Progress Notes (Signed)
Pt states she was unable to keep jello down, however, she was able to keep down broth and beverages. Will continue to monitor.

## 2017-01-30 NOTE — Care Management Note (Signed)
Case Management Note  Patient Details  Name: Joen LauraChristine D Herder MRN: [id number] Date of Birth: Jan 21, 1932  Subjective/Objective:     Esophageal stricture and a.fib               Action/Plan:  Lives alone with children as her support group Date:  Jan 30, 2017  Chart reviewed for concurrent status and case management needs.  Will continue to follow patient progress.  Discharge Planning: following for needs  Expected discharge date: [id number]  Marcelle SmilingRhonda Chereese [place], BSN, OrientRN3, [place]CCM   [phone]   Expected Discharge Date:                  Expected Discharge Plan:  Home/Self Care  In-House Referral:     Discharge planning Services  CM Consult  Post Acute Care Choice:    Choice offered to:     DME Arranged:    DME Agency:     HH Arranged:    HH Agency:     Status of Service:  In process, will continue to follow  If discussed at Long Length of Stay Meetings, dates discussed:    Additional Comments:  Golda AcreDavis, Rhyli Depaula Lynn, RN 01/30/2017, 9:19 AM

## 2017-01-30 NOTE — H&P (Signed)
History and Physical    Laurie Pugh ZOX:[id number] DOB: June 26, 1932 DOA: 01/29/2017  PCP: Rodrigo Ran, MD  Patient coming from: Home  I have personally briefly reviewed patient's old medical records in [facility]  Chief Complaint: Food impaction in esophagus  HPI: Laurie Pugh is a 83 y.o. female with medical history significant of A.Fib on eliquis, HTN, HLD.  Patient presents to the ED with c/o gradually worsening sensation of food impaction and inability to swallow since swallowing a hot dog 4 days ago.  She now vomits up anything she tries to swallow including pills or fluids.  At PCPs office 3 days ago, diagnosed with GERD but unable to tolerate PPIs (throws up these pills too).   ED Course: Dr. Lavon Paganini performed EGD, removed food impaction, and found very narrow stricture that she didn't want to try and force the endoscope through.   Review of Systems: As per HPI otherwise 10 point review of systems negative.   Past Medical History:  Diagnosis Date  . Angina pectoris, unstable (HCC)   . Atrial fibrillation (HCC)   . Dyslipidemia   . Dysrhythmia   . Hyperlipidemia   . Hypertension   . Neck pain   . PAF (paroxysmal atrial fibrillation) (HCC)   . PSVT (paroxysmal supraventricular tachycardia) (HCC)     Past Surgical History:  Procedure Laterality Date  . ABDOMINAL HYSTERECTOMY    . APPENDECTOMY    . BLADDER SURGERY     bladder surgery  . BREAST EXCISIONAL BIOPSY    . CARDIAC CATHETERIZATION  06/28/2004   no significant CAD (Dr. Laurell Josephs)  . CYSTOSCOPY W/ RETROGRADES Bilateral 04/11/2016   Procedure: CYSTOSCOPY WITH BILATERAL RETROGRADE PYELOGRAM;  Surgeon: Hildred Laser, MD;  Location: [facility];  Service: Urology;  Laterality: Bilateral;  . TRANSTHORACIC ECHOCARDIOGRAM  2009   borderline conc LVH; trace MR; mild-mod TR, RVSP 30-68mmHg     reports that she quit smoking about 13 years ago. She has a 40.00 pack-year smoking history. She has never  used smokeless tobacco. She reports that she does not drink alcohol or use drugs.  Allergies  Allergen Reactions  . Other Other (See Comments)    Mycins - cannot recall whether azithromycin, e-mycin (REACTION: unknown)  . Codeine Nausea Only    Family History  Problem Relation Age of Onset  . Heart disease Mother   . Heart disease Sister   . Suicidality Brother   . Cancer Sister   . COPD Sister   . Suicidality Child      Prior to Admission medications   Medication Sig Start Date End Date Taking? Authorizing Provider  alendronate (FOSAMAX) 70 MG tablet Take 70 mg by mouth once a week. Take with a full glass of water on an empty stomach.   Yes [provider]  apixaban (ELIQUIS) 5 MG TABS tablet Take 5 mg by mouth 2 (two) times daily.   Yes [provider]  atenolol (TENORMIN) 25 MG tablet TAKE 1 TABLET BY MOUTH  DAILY Patient taking differently: TAKE 25 MG BY MOUTH DAILY 01/11/17  Yes Hilty, Lisette Abu, MD  benazepril (LOTENSIN) 20 MG tablet Take 20 tablets by mouth daily.  05/30/13  Yes [provider]  Calcium Carbonate-Vitamin D (CALTRATE 600+D) 600-400 MG-UNIT per chew tablet Chew 2 tablets by mouth daily.   Yes [provider]  LORazepam (ATIVAN) 1 MG tablet Take 1 mg by mouth 2 (two) times daily.  05/30/13  Yes [provider]  rosuvastatin (CRESTOR) 10 MG tablet Take 10 mg by mouth daily.   Yes [provider]    Physical Exam: Vitals:   01/30/17 0120 01/30/17 0125 01/30/17 0130 01/30/17 0135  BP: (!) 87/58 (!) 81/47 96/63 (!) 97/53  Pulse: (!) 115 93 100 100  Resp: 20 (!) 22 (!) 21 (!) 22  Temp:      TempSrc:      SpO2: 99% 98% 96% 94%  Weight:      Height:        Constitutional: NAD, calm, comfortable Eyes: PERRL, lids and conjunctivae normal ENMT: Mucous membranes are moist. Posterior pharynx clear of any exudate or lesions.Normal dentition.  Neck: normal, supple, no masses, no thyromegaly Respiratory: clear to  auscultation bilaterally, no wheezing, no crackles. Normal respiratory effort. No accessory muscle use.  Cardiovascular: Regular rate and rhythm, no murmurs / rubs / gallops. No extremity edema. 2+ pedal pulses. No carotid bruits.  Abdomen: no tenderness, no masses palpated. No hepatosplenomegaly. Bowel sounds positive.  Musculoskeletal: no clubbing / cyanosis. No joint deformity upper and lower extremities. Good ROM, no contractures. Normal muscle tone.  Skin: no rashes, lesions, ulcers. No induration Neurologic: CN 2-12 grossly intact. Sensation intact, DTR normal. Strength 5/5 in all 4.  Psychiatric: Normal judgment and insight. Alert and oriented x 3. Normal mood.    Labs on Admission: I have personally reviewed following labs and imaging studies  CBC:  Recent Labs Lab 01/29/17 2245  WBC 13.0*  NEUTROABS 9.6*  HGB 15.9*  HCT 48.3*  MCV 91.1  PLT 247   Basic Metabolic Panel:  Recent Labs Lab 01/29/17 2245  NA 123*  K 4.1  CL 84*  CO2 29  GLUCOSE 96  BUN 6  CREATININE 0.54  CALCIUM 8.9   GFR: Estimated Creatinine Clearance: 44.8 mL/min (by C-G formula based on SCr of 0.54 mg/dL). Liver Function Tests: No results for input(s): AST, ALT, ALKPHOS, BILITOT, PROT, ALBUMIN in the last 168 hours. No results for input(s): LIPASE, AMYLASE in the last 168 hours. No results for input(s): AMMONIA in the last 168 hours. Coagulation Profile: No results for input(s): INR, PROTIME in the last 168 hours. Cardiac Enzymes: No results for input(s): CKTOTAL, CKMB, CKMBINDEX, TROPONINI in the last 168 hours. BNP (last 3 results) No results for input(s): PROBNP in the last 8760 hours. HbA1C: No results for input(s): HGBA1C in the last 72 hours. CBG: No results for input(s): GLUCAP in the last 168 hours. Lipid Profile: No results for input(s): CHOL, HDL, LDLCALC, TRIG, CHOLHDL, LDLDIRECT in the last 72 hours. Thyroid Function Tests: No results for input(s): TSH, T4TOTAL, FREET4,  T3FREE, THYROIDAB in the last 72 hours. Anemia Panel: No results for input(s): VITAMINB12, FOLATE, FERRITIN, TIBC, IRON, RETICCTPCT in the last 72 hours. Urine analysis: No results found for: COLORURINE, APPEARANCEUR, LABSPEC, PHURINE, GLUCOSEU, HGBUR, BILIRUBINUR, KETONESUR, PROTEINUR, UROBILINOGEN, NITRITE, LEUKOCYTESUR  Radiological Exams on Admission: Dg Chest 2 View  Result Date: 01/29/2017 CLINICAL DATA:  Acute onset of emesis and mid chest pain. Initial encounter. EXAM: CHEST  2 VIEW COMPARISON:  Chest radiograph performed 03/13/2007 FINDINGS: The lungs are well-aerated. Mild peribronchial thickening is noted. There is no evidence of pleural effusion or pneumothorax. The heart is normal in size; the mediastinal contour is within normal limits. No acute osseous abnormalities are seen. A moderate hiatal hernia is again noted, partially filled with air. IMPRESSION: 1. Mild peribronchial thickening noted. Lungs otherwise grossly clear. 2. Moderate hiatal hernia again noted, partially filled with air.  Electronically Signed   By: Roanna RaiderJeffery  Chang M.D.   On: 01/29/2017 23:49    EKG: Independently reviewed.  Assessment/Plan Principal Problem:   Esophageal stricture Active Problems:   A-fib (HCC)    1. Esophageal stricture - 1. Gastrografin esophageal study 2. GI to follow 3. NPO 4. IVF for dehydration 2. A.Fib - 1. Holding all PO meds due to esophageal stricture 2. Tele monitor to watch for RVR 3. Start IV Cardizem or metoprolol if RVR develops 4. Holding anticoagulants since GI may wish to end up doing repeat EGD with dialation of esophagus if they feel this is appropriate for stricture.  DVT prophylaxis: SCDs, possible need for further invasive EGD Code Status: Full Family Communication: Daughter at bedside Disposition Plan: Home after admit Consults called: Dr. Lavon PaganiniNandigam performed EGD in ED, GI will follow inpatient Admission status: Place in obs   Hillary BowGARDNER, JARED M. DO [facility] Pager [phone]  If 7AM-7PM, please contact day team taking care of patient [email or web address] Password [email or web address]  01/30/2017, 1:50 AM

## 2017-01-30 NOTE — Op Note (Signed)
[facility] Patient Name: Laurie ManningChristine Pugh Procedure Date: 01/30/2017 MRN: [id number] Attending MD: Wilhemina BonitoJohn N. Marina GoodellPerry , MD Date of Birth: 08-08-1932 CSN: [id number] Age: 83 Admit Type: Emergency Department Procedure:                Upper GI endoscopy, with removal of esophageal                            foreign body Indications:              Dysphagia, Abnormal cine-esophagram. EGD last                            evening for possible food impaction. Unable to pass                            scope. Question malignancy. Now for reevaluation                            post esophagram Providers:                Wilhemina BonitoJohn N. Marina GoodellPerry, MD, Beryle BeamsJanie Billups, Technician, Tillie Fantasiaonna                            Pickering, RN, Margo AyeValeria McKoy, Technician, Doran ClayStephen                            R. Alday CRNA, CRNA Referring MD:              Medicines:                Monitored Anesthesia Care Complications:            No immediate complications. Estimated Blood Loss:     Estimated blood loss: none. Procedure:                Pre-Anesthesia Assessment:                           - Prior to the procedure, a History and Physical                            was performed, and patient medications and                            allergies were reviewed. The patient's tolerance of                            previous anesthesia was also reviewed. The risks                            and benefits of the procedure and the sedation                            options and risks were discussed with the patient.                            All  questions were answered, and informed consent                            was obtained. Prior Anticoagulants: The patient has                            taken Eliquis (apixaban), last dose was 5 days                            prior to procedure. ASA Grade Assessment: III - A                            patient with severe systemic disease. After                            reviewing the  risks and benefits, the patient was                            deemed in satisfactory condition to undergo the                            procedure.                           - Prior to the procedure, a History and Physical                            was performed, and patient medications and                            allergies were reviewed. The patient's tolerance of                            previous anesthesia was also reviewed. The risks                            and benefits of the procedure and the sedation                            options and risks were discussed with the patient.                            All questions were answered, and informed consent                            was obtained. Prior Anticoagulants: The patient                            last took Eliquis (apixaban) 4 days prior to the                            procedure. ASA Grade Assessment: III - A patient  with severe systemic disease. After reviewing the                            risks and benefits, the patient was deemed in                            satisfactory condition to undergo the procedure.                           After obtaining informed consent, the endoscope was                            passed under direct vision. Throughout the                            procedure, the patient's blood pressure, pulse, and                            oxygen saturations were monitored continuously. The                            EG-[id number] ([id number]) scope was introduced through the                            mouth, and advanced to the second part of duodenum.                            The upper GI endoscopy was accomplished without                            difficulty. The patient tolerated the procedure                            well. Scope In: Scope Out: Findings:      A large bolus of Food was found in the lower third of the esophagus.       Removal of food was  accomplished by gently advancing the bolus into the       stomach..      The esophagus post removal revealed esophagitis with friability, edema,       and possible underlying long segment Barrett's esophagus. There was a       benign 14 mm esophageal stricture at the GE junction and.      The stomach was normal except for the presence of a moderately large 7       cm hiatal hernia.      The examined duodenum was normal.      The cardia and gastric fundus were normal on retroflexion. Impression:               - Food in the lower third of the esophagus. Removal                            was successful.                           -  Esophagitis with stricture.                           - Normal stomach.                           - Normal examined duodenum. Moderate Sedation:      none Recommendation:           1. Prescribe omeprazole 40 mg daily                           2. Soft foods and liquids only. Avoid meats or                            breads for the time being                           3. Okay to resume medications including blood                            thinner                           4. Okay to go home today                           5. Office follow-up with Dr. Marina Goodell in about 4                            weeks. Will benefit from outpatient esophageal                            dilation after esophagitis and edema have improved                           Findings discussed with patient and her daughters.                            Copy of report provided Procedure Code(s):        --- Professional ---                           [phone], Esophagogastroduodenoscopy, flexible,                            transoral; with removal of foreign body(s) Diagnosis Code(s):        --- Professional ---                           U04.540J, Food in esophagus causing other injury,                            initial encounter                           R13.10, Dysphagia, unspecified  R93.3, Abnormal findings on diagnostic imaging of                            other parts of digestive tract CPT copyright 2016 American Medical Association. All rights reserved. The codes documented in this report are preliminary and upon coder review may  be revised to meet current compliance requirements. Wilhemina Bonito. Marina Goodell, MD 01/30/2017 2:07:48 PM This report has been signed electronically. Number of Addenda: 0

## 2017-01-30 NOTE — H&P (Signed)
[facility] History and Physical   Primary Care Physician:  Rodrigo Ran, MD   Reason for Procedure:   Food impaction  Plan:    EGD with possible disimpaction of food bolus     HPI: Laurie Pugh is a 83 y.o. female  With h/o afib on Eliquis presented with difficulty swallowing for past 4 days after she ate hot dog 4 days ago. She is currently unable to handle secretions concerning for food impaction. No evidence of free air on CXR. Based on labs appears hemo concentrated, likely secondary to dehydration with no PO intake in past 4 days.  Patient was remotely followed by Dr Marina Goodell.    Past Medical History:  Diagnosis Date  . Angina pectoris, unstable (HCC)   . Atrial fibrillation (HCC)   . Dyslipidemia   . Dysrhythmia   . Hyperlipidemia   . Hypertension   . Neck pain   . PAF (paroxysmal atrial fibrillation) (HCC)   . PSVT (paroxysmal supraventricular tachycardia) (HCC)     Past Surgical History:  Procedure Laterality Date  . ABDOMINAL HYSTERECTOMY    . APPENDECTOMY    . BLADDER SURGERY     bladder surgery  . BREAST EXCISIONAL BIOPSY    . CARDIAC CATHETERIZATION  06/28/2004   no significant CAD (Dr. Laurell Josephs)  . CYSTOSCOPY W/ RETROGRADES Bilateral 04/11/2016   Procedure: CYSTOSCOPY WITH BILATERAL RETROGRADE PYELOGRAM;  Surgeon: Hildred Laser, MD;  Location: [facility];  Service: Urology;  Laterality: Bilateral;  . TRANSTHORACIC ECHOCARDIOGRAM  2009   borderline conc LVH; trace MR; mild-mod TR, RVSP 30-70mmHg    Prior to Admission medications   Medication Sig Start Date End Date Taking? Authorizing Provider  alendronate (FOSAMAX) 70 MG tablet Take 70 mg by mouth once a week. Take with a full glass of water on an empty stomach.    [provider]  apixaban (ELIQUIS) 5 MG TABS tablet Take 5 mg by mouth 2 (two) times daily.    [provider]  atenolol (TENORMIN) 25 MG tablet TAKE 1 TABLET BY MOUTH  DAILY 01/11/17   Hilty, Lisette Abu, MD   benazepril (LOTENSIN) 20 MG tablet Take 1 tablet by mouth daily. 05/30/13   [provider]  Calcium Carbonate-Vitamin D (CALTRATE 600+D) 600-400 MG-UNIT per chew tablet Chew 2 tablets by mouth daily.    [provider]  LORazepam (ATIVAN) 1 MG tablet Take 1 mg by mouth 2 (two) times daily.  05/30/13   [provider]  rosuvastatin (CRESTOR) 10 MG tablet Take 10 mg by mouth daily.    [provider]    No current facility-administered medications for this encounter.     Allergies as of 01/29/2017 - Review Complete 01/29/2017  Allergen Reaction Noted  . Other Other (See Comments) 06/24/2013  . Codeine Nausea Only 04/16/2014    Family History  Problem Relation Age of Onset  . Heart disease Mother   . Heart disease Sister   . Suicidality Brother   . Cancer Sister   . COPD Sister   . Suicidality Child     Social History   Social History  . Marital status: Widowed    Spouse name: N/A  . Number of children: N/A  . Years of education: N/A   Occupational History  . Not on file.   Social History Main Topics  . Smoking status: Former Smoker    Packs/day: 1.00    Years: 40.00    Quit date: 06/25/2003  . Smokeless  tobacco: Never Used  . Alcohol use No  . Drug use: No  . Sexual activity: Not on file   Other Topics Concern  . Not on file   Social History Narrative  . No narrative on file    Review of Systems:  All other review of systems negative except as mentioned in the HPI.  Physical Exam: Vital signs in last 24 hours: Temp:  [97.9 F (36.6 C)] 97.9 F (36.6 C) (05/06 2143) Pulse Rate:  [60-91] 91 (05/06 2351) Resp:  [18] 18 (05/06 2351) BP: (108-112)/(58-69) 112/69 (05/06 2351) SpO2:  [94 %-95 %] 95 % (05/06 2351) Weight:  [128 lb (58.1 kg)] 128 lb (58.1 kg) (05/06 2141)   General:   Alert,  Well-developed, well-nourished, pleasant and cooperative in NAD Lungs:  Clear throughout to auscultation.   Heart:  Regular rate and  rhythm; no murmurs, clicks, rubs,  or gallops. Abdomen:  Soft, nontender and nondistended. Normal bowel sounds.   Neuro/Psych:  Alert and cooperative. Normal mood and affect. A and O x 3   @K .Scherry RanVeena Madonna Flegal, MD [phone] Mon-Fri 8a-5p [phone] after 5p, weekends, holidays 01/30/2017 12:14 AM@

## 2017-01-30 NOTE — Op Note (Signed)
[facility] Patient Name: Laurie Pugh Procedure Date: 01/30/2017 MRN: [id number] Attending MD: Napoleon Form , MD Date of Birth: 1932-06-08 CSN: [id number] Age: 83 Admit Type: Emergency Department Procedure:                Upper GI endoscopy Indications:              Dysphagia, regurgitation X 4 days. Not tolerating                            PO intake and unable to handle secretions,                            concerning for food impaction. Providers:                Napoleon Form, MD, Norman Clay, RN, Oletha Blend, Technician Referring MD:              Medicines:                Fentanyl 50 micrograms IV, Midazolam 5 mg IV Complications:            No immediate complications. Estimated Blood Loss:     Estimated blood loss was minimal. Procedure:                Pre-Anesthesia Assessment:                           - Prior to the procedure, a History and Physical                            was performed, and patient medications and                            allergies were reviewed. The patient's tolerance of                            previous anesthesia was also reviewed. The risks                            and benefits of the procedure and the sedation                            options and risks were discussed with the patient.                            All questions were answered, and informed consent                            was obtained. Prior Anticoagulants: The patient                            last took Eliquis (apixaban) 4 days prior to the  procedure. ASA Grade Assessment: III - A patient                            with severe systemic disease. After reviewing the                            risks and benefits, the patient was deemed in                            satisfactory condition to undergo the procedure.                           After obtaining informed consent, the endoscope was                             passed under direct vision. Throughout the                            procedure, the patient's blood pressure, pulse, and                            oxygen saturations were monitored continuously. The                            EG-[id number]) scope was introduced through the                            mouth, and advanced to the middle third of                            esophagus. The Endoscope was introduced through the                            mouth, and advanced to the lower third of                            esophagus. The upper GI endoscopy was performed                            with difficulty due to abnormal anatomy. Successful                            completion of the procedure was aided by                            withdrawing the scope and replacing with the                            pediatric endoscope. The patient tolerated the                            procedure well. Scope In: Scope Out: Findings:      Retained secretions in the esophagus, did not identify any food  bolus.       Adult endoscope could not be advanced beyond 30cm, switched to pediatric       5mm scope and noted multiple severe (stenosis; an endoscope cannot pass)       stenosis starting at 30 cm from incissors associated with severe       esophageal ulceration and abnormal mucosa concerning for malignancy were       found from 30 to 35 cm from the incisors. The narrowest stenosis       measured 2-3 mm (inner diameter) at 35cm and this was not traversed. Impression:               - Esophageal stenoses, cannot exclude underlying                            malignancy.                           - No specimens collected. Moderate Sedation:      Moderate (conscious) sedation was administered by the endoscopy nurse       and supervised by the endoscopist. The following parameters were       monitored: oxygen saturation, heart rate, blood pressure, and response       to  care. Total physician intraservice time was 17 minutes. Recommendation:           - NPO.                           -Continue IV fluids                           - Admit to floor (Discussed with ER Physician and                            Hospitalist)                           - Continue present medications.                           - Gastrograffin esophagogram to better dileanate                            esophageal anatomy and the esophageal stricture                           - Repeat EGD based on findings of esophagogram Procedure Code(s):        --- Professional ---                           43200, Esophagoscopy, flexible, transoral;                            diagnostic, including collection of specimen(s) by                            brushing or washing, when performed (separate  procedure) Diagnosis Code(s):        --- Professional ---                           K22.2, Esophageal obstruction                           R13.10, Dysphagia, unspecified                           T18.108A, Unspecified foreign body in esophagus                            causing other injury, initial encounter CPT copyright 2016 American Medical Association. All rights reserved. The codes documented in this report are preliminary and upon coder review may  be revised to meet current compliance requirements. Napoleon FormKavitha V. Nandigam, MD 01/30/2017 1:49:10 AM This report has been signed electronically. Number of Addenda: 0

## 2017-01-30 NOTE — Transfer of Care (Signed)
Immediate Anesthesia Transfer of Care Note  Patient: Laurie Pugh  Procedure(s) Performed: Procedure(s): ESOPHAGOGASTRODUODENOSCOPY (EGD) (N/A)  Patient Location: PACU  Anesthesia Type:MAC  Level of Consciousness: sedated  Airway & Oxygen Therapy: Patient Spontanous Breathing and Patient connected to nasal cannula oxygen  Post-op Assessment: Report given to RN and Post -op Vital signs reviewed and stable  Post vital signs: Reviewed and stable  Last Vitals:  Vitals:   01/30/17 0232 01/30/17 0602  BP: 96/72 (!) 102/54  Pulse: 81 86  Resp: 20 20  Temp: 36.4 C 36.6 C    Last Pain:  Vitals:   01/30/17 0639  TempSrc:   PainSc: 0-No pain      Patients Stated Pain Goal: 2 (41/93/79 0240)  Complications: No apparent anesthesia complications

## 2017-01-30 NOTE — Anesthesia Postprocedure Evaluation (Addendum)
Anesthesia Post Note  Patient: Laurie Pugh  Procedure(s) Performed: Procedure(s) (LRB): ESOPHAGOGASTRODUODENOSCOPY (EGD) (N/A)  Patient location during evaluation: Endoscopy Level of consciousness: awake, awake and alert and oriented Pain management: pain level controlled Vital Signs Assessment: post-procedure vital signs reviewed and stable Respiratory status: spontaneous breathing, nonlabored ventilation and respiratory function stable Anesthetic complications: no       Last Vitals:  Vitals:   01/30/17 1430 01/30/17 1517  BP: (!) 91/39 (!) 118/53  Pulse:  81  Resp:  20  Temp:  36.4 C    Last Pain:  Vitals:   01/30/17 1517  TempSrc: Oral  PainSc:                  Haytham Maher COKER

## 2017-01-30 NOTE — Progress Notes (Addendum)
PROGRESS NOTE    Laurie Pugh  ZOX:[id number] DOB: [birth year] DOA: 01/29/2017 PCP: Rodrigo Ran, MD   Chief Complaint  Patient presents with  . Dysphagia   Brief Narrative:  HPI on 01/30/2017 by Dr. Lyda Perone Laurie Pugh is a 83 y.o. female with medical history significant of A.Fib on eliquis, HTN, HLD.  Patient presents to the ED with c/o gradually worsening sensation of food impaction and inability to swallow since swallowing a hot dog 4 days ago.  She now vomits up anything she tries to swallow including pills or fluids.  At PCPs office 3 days ago, diagnosed with GERD but unable to tolerate PPIs (throws up these pills too).  Assessment & Plan   Food impaction secondary to Esophageal Stricture -Gastroenterology consulted and appreciated -S/p EGD showed esophageal stenosis cannot exclude underlying malignancy -Status post Gastrografin esophagram: long irregular malignant-appearing esophageal stricture involving the lower half of the thoracic esophagus, moderate hiatal hernia, grossly normal swallowing function with no evidence of laryngeal penetration or tracheobronchial aspiration -Pending further recommendations from gastroenterology -Continue NPO for the time being  Paroxysmal Atrial fibrillation -CHADSVASC 3 (age, gender, HTN) -Eliquis held  -Atenolol held due to NPO status -Will add on metoprolol IV for rate control  Essential hypertension -Benazepril held due to NPO status -Will place on IV metoprolol  Hyperlipidemia -Statin held  Leukocytosis -likely reactive -afebrile -CXR unremarkable for infection -No urinary complaints -Continue to monitor   DVT Prophylaxis  SCDs  Code Status: Full  Family Communication: Daughters at bedside  Disposition Plan: Observation  Consultants Gastroenterology  Procedures  EGD Barium Swallow/Esophogram  Antibiotics   Anti-infectives    None      Subjective:   Laurie Pugh seen and examined today.   Patient denies current abdominal pain, nausea, vomiting. Was having a "stuck" feeling. Denies chest pain, shortness of breath, dizziness, headache.  Objective:   Vitals:   01/30/17 0135 01/30/17 0232 01/30/17 0240 01/30/17 0602  BP: (!) 97/53 96/72  (!) 102/54  Pulse: 100 81  86  Resp: (!) 22 20  20   Temp:  97.5 F (36.4 C)  97.9 F (36.6 C)  TempSrc:  Oral  Oral  SpO2: 94% 94%  95%  Weight:   56.6 kg (124 lb 12.5 oz)   Height:   5\' 3"  (1.6 m)     Intake/Output Summary (Last 24 hours) at 01/30/17 1016 Last data filed at 01/30/17 0300  Gross per 24 hour  Intake            29.17 ml  Output                0 ml  Net            29.17 ml   Filed Weights   01/29/17 2141 01/30/17 0240  Weight: 58.1 kg (128 lb) 56.6 kg (124 lb 12.5 oz)    Exam  General: Well developed, well nourished, NAD, appears stated age  HEENT: NCAT, mucous membranes moist.   Cardiovascular: S1 S2 auscultated, no rubs, murmurs or gallops.   Respiratory: Clear to auscultation bilaterally with equal chest rise  Abdomen: Soft, nontender, nondistended, + bowel sounds  Extremities: warm dry without cyanosis clubbing or edema  Neuro: AAOx3, nonfocal  Psych: Normal affect and demeanor with intact judgement and insight   Data Reviewed: I have personally reviewed following labs and imaging studies  CBC:  Recent Labs Lab 01/29/17 2245  WBC 13.0*  NEUTROABS 9.6*  HGB 15.9*  HCT  48.3*  MCV 91.1  PLT 247   Basic Metabolic Panel:  Recent Labs Lab 01/29/17 2245  NA 123*  K 4.1  CL 84*  CO2 29  GLUCOSE 96  BUN 6  CREATININE 0.54  CALCIUM 8.9   GFR: Estimated Creatinine Clearance: 44.8 mL/min (by C-G formula based on SCr of 0.54 mg/dL). Liver Function Tests: No results for input(s): AST, ALT, ALKPHOS, BILITOT, PROT, ALBUMIN in the last 168 hours. No results for input(s): LIPASE, AMYLASE in the last 168 hours. No results for input(s): AMMONIA in the last 168 hours. Coagulation Profile: No  results for input(s): INR, PROTIME in the last 168 hours. Cardiac Enzymes: No results for input(s): CKTOTAL, CKMB, CKMBINDEX, TROPONINI in the last 168 hours. BNP (last 3 results) No results for input(s): PROBNP in the last 8760 hours. HbA1C: No results for input(s): HGBA1C in the last 72 hours. CBG: No results for input(s): GLUCAP in the last 168 hours. Lipid Profile: No results for input(s): CHOL, HDL, LDLCALC, TRIG, CHOLHDL, LDLDIRECT in the last 72 hours. Thyroid Function Tests: No results for input(s): TSH, T4TOTAL, FREET4, T3FREE, THYROIDAB in the last 72 hours. Anemia Panel: No results for input(s): VITAMINB12, FOLATE, FERRITIN, TIBC, IRON, RETICCTPCT in the last 72 hours. Urine analysis: No results found for: COLORURINE, APPEARANCEUR, LABSPEC, PHURINE, GLUCOSEU, HGBUR, BILIRUBINUR, KETONESUR, PROTEINUR, UROBILINOGEN, NITRITE, LEUKOCYTESUR Sepsis Labs: @LABRCNTIP (procalcitonin:4,lacticidven:4)  )No results found for this or any previous visit (from the past 240 hour(s)).    Radiology Studies: Dg Chest 2 View  Result Date: 01/29/2017 CLINICAL DATA:  Acute onset of emesis and mid chest pain. Initial encounter. EXAM: CHEST  2 VIEW COMPARISON:  Chest radiograph performed 03/13/2007 FINDINGS: The lungs are well-aerated. Mild peribronchial thickening is noted. There is no evidence of pleural effusion or pneumothorax. The heart is normal in size; the mediastinal contour is within normal limits. No acute osseous abnormalities are seen. A moderate hiatal hernia is again noted, partially filled with air. IMPRESSION: 1. Mild peribronchial thickening noted. Lungs otherwise grossly clear. 2. Moderate hiatal hernia again noted, partially filled with air. Electronically Signed   By: Roanna RaiderJeffery  Chang M.D.   On: 01/29/2017 23:49   Dg Esophagus W/water Sol Cm  Result Date: 01/30/2017 CLINICAL DATA:  83 year old female inpatient with worsening vomiting and dysphagia. Patient underwent upper endoscopy  overnight demonstrating high-grade esophageal stricture, unable to be traversed with pediatric scope. EXAM: ESOPHOGRAM/BARIUM SWALLOW TECHNIQUE: Single contrast examination was performed using thin barium or water soluble. FLUOROSCOPY TIME:  Fluoroscopy Time:  2.6 minutes Radiation Exposure Index (if provided by the fluoroscopic device): 21.0 mGy Number of Acquired Spot Images: 9 COMPARISON:  04/08/2016 CT abdomen/ pelvis. Chest radiograph from one day prior. FINDINGS: Grossly normal swallow function. No evidence of laryngeal penetration or tracheobronchial aspiration. There is a long irregular esophageal stricture involving the lower half of the thoracic esophagus with irregular luminal contour and irregular internal filling defects. The most severe site of narrowing is in the lower thoracic esophagus just above the esophagogastric junction. The upper thoracic esophagus is mildly patulous. There is prominent upper thoracic esophageal dysmotility characterized by proximal escape of the barium bolus and significant weakening of primary peristalsis in the mid to lower thoracic esophagus. No evidence of esophageal leak. There is a moderate hiatal hernia. IMPRESSION: 1. Long irregular malignant-appearing esophageal stricture involving the lower half of the thoracic esophagus. 2. Moderate hiatal hernia. 3. Grossly normal swallowing function, with no evidence of laryngeal penetration or tracheobronchial aspiration. Electronically Signed   By: Barbara CowerJason  A Poff M.D.   On: 01/30/2017 10:05     Scheduled Meds: Continuous Infusions: . sodium chloride 125 mL/hr at 01/30/17 0246     LOS: 0 days   Time Spent in minutes   30 minutes  Mariette Cowley D.O. on 01/30/2017 at 10:16 AM  Between 7am to 7pm - Pager - [phone]  After 7pm go to [email or web address] - password [email or web address]  And look for the night coverage person covering for me after hours  [facility] Office  [phone]

## 2017-01-30 NOTE — Anesthesia Preprocedure Evaluation (Addendum)
Anesthesia Evaluation  Patient identified by MRN, date of birth, ID band Patient awake    Reviewed: Allergy & Precautions, NPO status , Patient's Chart, lab work & pertinent test results  Airway Mallampati: II  TM Distance: >3 FB Neck ROM: Full    Dental  (+) Partial Upper, Partial Lower   Pulmonary former smoker,    breath sounds clear to auscultation       Cardiovascular hypertension,  Rhythm:Irregular Rate:Normal     Neuro/Psych    GI/Hepatic   Endo/Other    Renal/GU      Musculoskeletal   Abdominal   Peds  Hematology   Anesthesia Other Findings   Reproductive/Obstetrics                            Anesthesia Physical Anesthesia Plan  ASA: III  Anesthesia Plan: MAC   Post-op Pain Management:    Induction:   Airway Management Planned: Natural Airway and Simple Face Mask  Additional Equipment:   Intra-op Plan:   Post-operative Plan:   Informed Consent: I have reviewed the patients History and Physical, chart, labs and discussed the procedure including the risks, benefits and alternatives for the proposed anesthesia with the patient or authorized representative who has indicated his/her understanding and acceptance.     Plan Discussed with: CRNA and Anesthesiologist  Anesthesia Plan Comments:         Anesthesia Quick Evaluation

## 2017-01-31 DIAGNOSIS — E876 Hypokalemia: Secondary | ICD-10-CM

## 2017-01-31 DIAGNOSIS — I10 Essential (primary) hypertension: Secondary | ICD-10-CM

## 2017-01-31 DIAGNOSIS — F419 Anxiety disorder, unspecified: Secondary | ICD-10-CM

## 2017-01-31 DIAGNOSIS — E871 Hypo-osmolality and hyponatremia: Secondary | ICD-10-CM

## 2017-01-31 LAB — CBC
HCT: 45.4 % (ref 36.0–46.0)
HEMOGLOBIN: 14.3 g/dL (ref 12.0–15.0)
MCH: 29.4 pg (ref 26.0–34.0)
MCHC: 31.5 g/dL (ref 30.0–36.0)
MCV: 93.2 fL (ref 78.0–100.0)
Platelets: 205 10*3/uL (ref 150–400)
RBC: 4.87 MIL/uL (ref 3.87–5.11)
RDW: 16.4 % — AB (ref 11.5–15.5)
WBC: 9.3 10*3/uL (ref 4.0–10.5)

## 2017-01-31 LAB — BASIC METABOLIC PANEL
Anion gap: 9 (ref 5–15)
BUN: 19 mg/dL (ref 6–20)
CHLORIDE: 111 mmol/L (ref 101–111)
CO2: 22 mmol/L (ref 22–32)
CREATININE: 0.59 mg/dL (ref 0.44–1.00)
Calcium: 7.3 mg/dL — ABNORMAL LOW (ref 8.9–10.3)
GFR calc non Af Amer: >60 mL/min (ref >60)
Glucose, Bld: 90 mg/dL (ref 65–99)
Potassium: 3.3 mmol/L — ABNORMAL LOW (ref 3.5–5.1)
SODIUM: 142 mmol/L (ref 135–145)

## 2017-01-31 MED ORDER — BENAZEPRIL HCL 20 MG PO TABS
40.0000 mg | ORAL_TABLET | Freq: Every day | ORAL | Status: DC
  Administered 2017-01-31: 40 mg via ORAL
  Filled 2017-01-31: qty 1

## 2017-01-31 MED ORDER — APIXABAN 5 MG PO TABS
5.0000 mg | ORAL_TABLET | Freq: Two times a day (BID) | ORAL | Status: DC
  Administered 2017-01-31: 5 mg via ORAL
  Filled 2017-01-31: qty 1

## 2017-01-31 MED ORDER — OMEPRAZOLE 40 MG PO CPDR: 40.0000 mg | DELAYED_RELEASE_CAPSULE | Freq: Every day | ORAL | 0 refills | Status: DC

## 2017-01-31 MED ORDER — APIXABAN 2.5 MG PO TABS: 2.5000 mg | ORAL_TABLET | Freq: Two times a day (BID) | ORAL | 0 refills | Status: DC

## 2017-01-31 MED ORDER — POTASSIUM CHLORIDE 20 MEQ/15ML (10%) PO SOLN
40.0000 meq | Freq: Every day | ORAL | Status: DC
  Administered 2017-01-31: 40 meq via ORAL
  Filled 2017-01-31: qty 30

## 2017-01-31 MED ORDER — ENSURE ENLIVE PO LIQD
237.0000 mL | Freq: Three times a day (TID) | ORAL | Status: DC
  Administered 2017-01-31 (×2): 237 mL via ORAL

## 2017-01-31 MED ORDER — ROSUVASTATIN CALCIUM 10 MG PO TABS
10.0000 mg | ORAL_TABLET | Freq: Every day | ORAL | Status: DC
  Administered 2017-01-31: 10 mg via ORAL
  Filled 2017-01-31: qty 1

## 2017-01-31 MED ORDER — ATENOLOL 25 MG PO TABS
25.0000 mg | ORAL_TABLET | Freq: Every day | ORAL | Status: DC
  Administered 2017-01-31: 25 mg via ORAL
  Filled 2017-01-31: qty 1

## 2017-01-31 MED ORDER — ENSURE ENLIVE PO LIQD: 237.0000 mL | Freq: Three times a day (TID) | ORAL | 0 refills | Status: DC

## 2017-01-31 MED ORDER — LORAZEPAM 1 MG PO TABS
1.0000 mg | ORAL_TABLET | Freq: Two times a day (BID) | ORAL | Status: DC
  Administered 2017-01-31: 1 mg via ORAL
  Filled 2017-01-31: qty 1

## 2017-01-31 NOTE — Discharge Instructions (Signed)
Esophageal Stricture Esophageal stricture is a condition that causes the esophagus to become narrow. The esophagus is the long tube in your throat that carries food and liquid from your mouth to your stomach. Esophageal stricture can make it difficult, painful, or even impossible to swallow. The condition also makes choking more likely. What are the causes? Gastroesophageal reflux disease (GERD) is the most common cause of esophageal stricture. In GERD, stomach acid backs up into the esophagus. Over time, this causes scar tissue and leads to narrowing (stricture). Other causes of esophageal stricture include:  Scarring from ingesting a harmful substance.  Damage from medical instruments used in the esophagus.  Radiation therapy.  Cancer. What increases the risk? You are at greater risk for esophageal stricture if you have GERD or esophageal cancer. What are the signs or symptoms? Signs and symptoms of esophageal stricture include:  Difficulty swallowing.  Pain when swallowing.  Heartburn.  Vomiting or spitting up (regurgitating) food or liquids.  Weight loss. How is this diagnosed? Your health care provider may suspect esophageal stricture based on your symptoms. A physical exam will also be done. You may need tests to confirm the diagnosis. These can include:  Upper endoscopy. Your health care provider will insert a flexible tube with a tiny camera on it (endoscope) into your esophagus to check for a stricture. A tissue sample may also be taken to be examined under a microscope (biopsy).  Esophageal pH monitoring. This test involves collecting acid in the esophagus with a tube to determine how much stomach acid is entering the esophagus.  Barium swallow test. For this test, you will drink a barium solution that coats the lining of the esophagus. Then you will have an X-ray taken. The barium solution helps to show if there is stricture. How is this treated? Treatment for  esophageal stricture depends on what is causing your condition and how severe it is. Treatment options include:  Esophageal dilatation. In this procedure, a health care provider inserts an endoscope or a tool called a dilator into your esophagus to gently stretch it and make the opening wider.  Stents. In some cases, your health care provider may place a small device (stent) in the esophagus to keep it open.  Acid-blocking medicines. Taking these helps manage GERD symptoms after an esophageal stricture. This can prevent the stricture from returning. Follow these instructions at home:  Do not drink alcohol.  Do not use any tobacco products, including cigarettes, chewing tobacco, or electronic cigarettes. If you need help quitting, ask your health care provider.  Lose weight if you are overweight.  Wear loose, comfortable clothing.  Do not eat for 3 hours before bedtime.  Elevate your head in bed with pillows.  Do not overeat at meals.  Do not eat foods that make reflux worse. These include:  Fatty foods.  Spicy foods.  Soda.  Tomato products.  Chocolate. Contact a health care provider if:  You have problems eating or swallowing.  You regurgitate food and liquid. This information is not intended to replace advice given to you by your health care provider. Make sure you discuss any questions you have with your health care provider. Document Released: 05/23/2006 Document Revised: 02/18/2016 Document Reviewed: 01/22/2014 Elsevier Interactive Patient Education  2017 [employer]

## 2017-01-31 NOTE — Progress Notes (Signed)
Date: Jan 31, 2017  Discharge orders review for case management needs.  None found  Rhonda Davis, BSN, RN3, CCM:  [phone] 

## 2017-01-31 NOTE — Discharge Summary (Addendum)
Physician Discharge Summary  Laurie Pugh UJW:[id number] DOB: [birth year] DOA: 01/29/2017  PCP: Laurie Ran, MD  Admit date: 01/29/2017 Discharge date: 01/31/2017  Time spent: 45 minutes  Recommendations for Outpatient Follow-up:  Patient will be discharged to home.  Patient will need to follow up with primary care provider within one week of discharge, repeat BMP. Follow up with Dr. Marina Pugh, gastroenterologist, 4 weeks. Patient should continue medications as prescribed.  Patient should follow a liquid diet, advance to soft diet as tolerated, avoid bread and meats.   Discharge Diagnoses:  Food Impaction secondary to esophageal stricture Process which her fibrillation Essential hypertension Hyperlipidemia Leukocytosis Hyponatremia  Discharge Condition: Stable  Diet recommendation: Liquid/soft diet  Filed Weights   01/29/17 2141 01/30/17 0240  Weight: 58.1 kg (128 lb) 56.6 kg (124 lb 12.5 oz)    History of present illness:  on 01/30/2017 by Dr. Margretta Pugh a 83 y.o.femalewith medical history significant of A.Fib on eliquis, HTN, HLD. Patient presents to the ED with c/o gradually worsening sensation of food impaction and inability to swallow since swallowing a hot dog 4 days ago. She now vomits up anything she tries to swallow including pills or fluids. At PCPs office 3 days ago, diagnosed with GERD but unable to tolerate PPIs (throws up these pills too).  Hospital Course:  Food impaction secondary to Esophageal Stricture -Gastroenterology consulted and appreciated -S/p EGD showed esophageal stenosis cannot exclude underlying malignancy -Status post Gastrografin esophagram: long irregular malignant-appearing esophageal stricture involving the lower half of the thoracic esophagus, moderate hiatal hernia, grossly normal swallowing function with no evidence of laryngeal penetration or tracheobronchial aspiration -Patient was unable to tolerate liquids after  first EGD and barium swallow.  -Repeat EGD on 01/30/2017: Food lower third of the esophagus, removal was successful. Esophagitis without stricture. Recommendations were to start omeprazole 40 mg daily, soft foods and liquids only, avoid meats or breads. May restart medications including blood thinner. Will benefit from outpatient esophageal dilatation after esophagitis and edema have improved. -Patient was unable to tolerate jello this morning. Have discussed full liquid diet and soft foods with her. Able to tolerate broth and other liquids. Discussed drinking protein supplements (Ensure vs Boost) to supplement fluids. Reviewed taking medications one tablet at a time.  -Discussed possible anxiety with foods given her recent food impaction -Will discharge patient with omeprazole 40mg  daily. Patient will need to follow up with GI in 4 weeks.  Paroxysmal Atrial fibrillation -CHADSVASC 3 (age, gender, HTN) -Eliquis and atenolol initially held due to NPO status -Will decrease Eliquis dose given age (per pharmacy) -Restarted today, patient tolerated well.  Essential hypertension -Benazepril held due to NPO status -Restarted today, tolerated well -BP currently stable   Hyperlipidemia -Continue Statin  Leukocytosis -likely reactive, has resolved now. -patient was afebrile, CXR unremarkable for infection, no urinary complaints  Hyponatremia  -Likely secondary to poor oral intake -Patient without neurological deficits -Resolved with IVF -Sodium on admission 123, currenlty 142  Hypokalemia -Replaced, repeat BMP in one week  Consultants Gastroenterology  Procedures  EGD x2  Barium Swallow/Esophogram  Discharge Exam: Vitals:   01/30/17 1955 01/31/17 0503  BP: 100/78 124/66  Pulse: 82 81  Resp: 18 15  Temp: 97.9 F (36.6 C) 98.6 F (37 C)   Patient states she feels mildly better but is unable to swallow jello. Feels ok drinking liquids. Denies chest pain, abdominal pain, nausea  or vomiting, headache, dizziness.    General: Well developed, well nourished, NAD, appears  stated age  HEENT: NCAT, mucous membranes moist.  Cardiovascular: S1 S2 auscultated,RRR, no murmurs  Respiratory: Clear to auscultation bilaterally with equal chest rise  Abdomen: Soft, nontender, nondistended, + bowel sounds  Extremities: warm dry without cyanosis clubbing or edema  Neuro: AAOx3, nonfocal  Psych: Normal affect and demeanor, pleasant  Discharge Instructions Discharge Instructions    Discharge instructions    Complete by:  As directed    Patient will be discharged to home.  Patient will need to follow up with primary care provider within one week of discharge, repeat BMP. Follow up with Dr. Marina GoodellPerry, gastroenterologist, 4 weeks. Patient should continue medications as prescribed.  Patient should follow a liquid diet, advance to soft diet as tolerated, avoid bread and meats.     Current Discharge Medication List    START taking these medications   Details  feeding supplement, ENSURE ENLIVE, (ENSURE ENLIVE) LIQD Take 237 mLs by mouth 3 (three) times daily between meals. Qty: 90 Bottle, Refills: 0    omeprazole (PRILOSEC) 40 MG capsule Take 1 capsule (40 mg total) by mouth daily. Qty: 30 capsule, Refills: 0      CONTINUE these medications which have CHANGED   Details  apixaban (ELIQUIS) 2.5 MG TABS tablet Take 1 tablet (2.5 mg total) by mouth 2 (two) times daily. Qty: 60 tablet, Refills: 0      CONTINUE these medications which have NOT CHANGED   Details  alendronate (FOSAMAX) 70 MG tablet Take 70 mg by mouth once a week. Take with a full glass of water on an empty stomach.    atenolol (TENORMIN) 25 MG tablet TAKE 1 TABLET BY MOUTH  DAILY Qty: 90 tablet, Refills: 3    benazepril (LOTENSIN) 20 MG tablet Take 20 tablets by mouth daily.     LORazepam (ATIVAN) 1 MG tablet Take 1 mg by mouth 2 (two) times daily.     rosuvastatin (CRESTOR) 10 MG tablet Take 10 mg by mouth  daily.      STOP taking these medications     Calcium Carbonate-Vitamin D (CALTRATE 600+D) 600-400 MG-UNIT per chew tablet        Allergies  Allergen Reactions  . Other Other (See Comments)    Mycins - cannot recall whether azithromycin, e-mycin (REACTION: unknown)  . Codeine Nausea Only   Follow-up Information    Laurie RanPerini, Mark, MD. Schedule an appointment as soon as possible for a visit in 1 week(s).   Specialty:  Internal Medicine Why:  Hospital follow up Contact information: [address] [place] [address] [phone]        Hilarie FredricksonPerry, John N, MD. Schedule an appointment as soon as possible for a visit in 1 week(s).   Specialty:  Gastroenterology Why:  hospital follow up Contact information: [address] [place] [address] [phone]            The results of significant diagnostics from this hospitalization (including imaging, microbiology, ancillary and laboratory) are listed below for reference.    Significant Diagnostic Studies: Dg Chest 2 View  Result Date: 01/29/2017 CLINICAL DATA:  Acute onset of emesis and mid chest pain. Initial encounter. EXAM: CHEST  2 VIEW COMPARISON:  Chest radiograph performed 03/13/2007 FINDINGS: The lungs are well-aerated. Mild peribronchial thickening is noted. There is no evidence of pleural effusion or pneumothorax. The heart is normal in size; the mediastinal contour is within normal limits. No acute osseous abnormalities are seen. A moderate hiatal hernia is again noted, partially filled with air.  IMPRESSION: 1. Mild peribronchial thickening noted. Lungs otherwise grossly clear. 2. Moderate hiatal hernia again noted, partially filled with air. Electronically Signed   By: Roanna Raider M.D.   On: 01/29/2017 23:49   Mm Digital Screening Bilateral  Addendum Date: 01/02/2017   ADDENDUM REPORT: 01/02/2017 09:41 ADDENDUM:  CORRECTED REPORT: ADDENDUM:  CORRECTED REPORT A possible mass in the left breast warrants further  evaluation. Recommendation: Diagnostic mammogram and possible left breast ultrasound recommended. BI-RADS: 0: Incomplete. Need additional imaging evaluation and/or prior mammograms for comparison. Electronically Signed   By: Norva Pavlov M.D.   On: 01/02/2017 09:41   Result Date: 01/02/2017 CLINICAL DATA:  Screening. EXAM: DIGITAL SCREENING BILATERAL MAMMOGRAM WITH CAD COMPARISON:  Previous exam(s). ACR Breast Density Category b: There are scattered areas of fibroglandular density. FINDINGS: There are no findings suspicious for malignancy. Images were processed with CAD. IMPRESSION: No mammographic evidence of malignancy. A result letter of this screening mammogram will be mailed directly to the patient. RECOMMENDATION: Screening mammogram in one year. (Code:SM-B-01Y) BI-RADS CATEGORY  1: Negative. Electronically Signed: By: Norva Pavlov M.D. On: 01/02/2017 09:26   Mm Diag Breast Tomo Uni Left  Result Date: 01/09/2017 CLINICAL DATA:  Patient recalled from screening for left breast mass. EXAM: 2D DIGITAL DIAGNOSTIC UNILATERAL LEFT MAMMOGRAM WITH CAD AND ADJUNCT TOMO COMPARISON:  Previous exam(s). ACR Breast Density Category b: There are scattered areas of fibroglandular density. FINDINGS: CC and MLO tomosynthesis images of the upper outer left breast were obtained. Questioned mass is compatible with a cutaneous lesion. Mammographic images were processed with CAD. IMPRESSION: Questioned left breast mass corresponds with a cutaneous lesion. No breast mass identified. RECOMMENDATION: Screening mammogram in one year.(Code:SM-B-01Y) I have discussed the findings and recommendations with the patient. Results were also provided in writing at the conclusion of the visit. If applicable, a reminder letter will be sent to the patient regarding the next appointment. BI-RADS CATEGORY  2: Benign. Electronically Signed   By: Annia Belt M.D.   On: 01/09/2017 09:54   Dg Esophagus W/water Marchelle Folks Cm  Result Date:  01/30/2017 CLINICAL DATA:  83 year old female inpatient with worsening vomiting and dysphagia. Patient underwent upper endoscopy overnight demonstrating high-grade esophageal stricture, unable to be traversed with pediatric scope. EXAM: ESOPHOGRAM/BARIUM SWALLOW TECHNIQUE: Single contrast examination was performed using thin barium or water soluble. FLUOROSCOPY TIME:  Fluoroscopy Time:  2.6 minutes Radiation Exposure Index (if provided by the fluoroscopic device): 21.0 mGy Number of Acquired Spot Images: 9 COMPARISON:  04/08/2016 CT abdomen/ pelvis. Chest radiograph from one day prior. FINDINGS: Grossly normal swallow function. No evidence of laryngeal penetration or tracheobronchial aspiration. There is a long irregular esophageal stricture involving the lower half of the thoracic esophagus with irregular luminal contour and irregular internal filling defects. The most severe site of narrowing is in the lower thoracic esophagus just above the esophagogastric junction. The upper thoracic esophagus is mildly patulous. There is prominent upper thoracic esophageal dysmotility characterized by proximal escape of the barium bolus and significant weakening of primary peristalsis in the mid to lower thoracic esophagus. No evidence of esophageal leak. There is a moderate hiatal hernia. IMPRESSION: 1. Long irregular malignant-appearing esophageal stricture involving the lower half of the thoracic esophagus. 2. Moderate hiatal hernia. 3. Grossly normal swallowing function, with no evidence of laryngeal penetration or tracheobronchial aspiration. Electronically Signed   By: Delbert Phenix M.D.   On: 01/30/2017 10:05    Microbiology: No results found for this or any previous visit (from the past  240 hour(s)).   Labs: Basic Metabolic Panel:  Recent Labs Lab 01/29/17 2245 01/31/17 0700  NA 123* 142  K 4.1 3.3*  CL 84* 111  CO2 29 22  GLUCOSE 96 90  BUN 6 19  CREATININE 0.54 0.59  CALCIUM 8.9 7.3*   Liver  Function Tests: No results for input(s): AST, ALT, ALKPHOS, BILITOT, PROT, ALBUMIN in the last 168 hours. No results for input(s): LIPASE, AMYLASE in the last 168 hours. No results for input(s): AMMONIA in the last 168 hours. CBC:  Recent Labs Lab 01/29/17 2245 01/31/17 0700  WBC 13.0* 9.3  NEUTROABS 9.6*  --   HGB 15.9* 14.3  HCT 48.3* 45.4  MCV 91.1 93.2  PLT 247 205   Cardiac Enzymes: No results for input(s): CKTOTAL, CKMB, CKMBINDEX, TROPONINI in the last 168 hours. BNP: BNP (last 3 results) No results for input(s): BNP in the last 8760 hours.  ProBNP (last 3 results) No results for input(s): PROBNP in the last 8760 hours.  CBG: No results for input(s): GLUCAP in the last 168 hours.   SignedEdsel Petrin  [facility] 01/31/2017, 1:50 PM

## 2017-02-02 ENCOUNTER — Encounter (HOSPITAL_COMMUNITY): Payer: Self-pay | Admitting: Internal Medicine

## 2017-02-27 ENCOUNTER — Ambulatory Visit: Payer: Medicare Other | Admitting: Internal Medicine

## 2017-03-07 ENCOUNTER — Telehealth: Payer: Self-pay | Admitting: Internal Medicine

## 2017-03-07 ENCOUNTER — Ambulatory Visit: Payer: Medicare Other | Admitting: Internal Medicine

## 2017-03-07 NOTE — Telephone Encounter (Signed)
No charge. 

## 2017-03-14 ENCOUNTER — Telehealth: Payer: Self-pay | Admitting: Internal Medicine

## 2017-03-14 NOTE — Telephone Encounter (Signed)
Pt calling to see if there had been any cancellations, she missed her appt due to car trouble. She is afraid she will have swallowing problems again. Discussed with pt that if we have a cancellation she may be able to be seen earlier but right now we don't have anything sooner than her scheduled appt. Pt verbalized understanding.

## 2017-04-06 ENCOUNTER — Telehealth: Payer: Self-pay

## 2017-04-06 ENCOUNTER — Ambulatory Visit (INDEPENDENT_AMBULATORY_CARE_PROVIDER_SITE_OTHER): Payer: Medicare Other | Admitting: Internal Medicine

## 2017-04-06 ENCOUNTER — Encounter (INDEPENDENT_AMBULATORY_CARE_PROVIDER_SITE_OTHER): Payer: Self-pay

## 2017-04-06 ENCOUNTER — Encounter: Payer: Self-pay | Admitting: Internal Medicine

## 2017-04-06 VITALS — BP 138/90 | HR 72 | Ht 63.0 in | Wt 124.2 lb

## 2017-04-06 DIAGNOSIS — I480 Paroxysmal atrial fibrillation: Secondary | ICD-10-CM

## 2017-04-06 DIAGNOSIS — K222 Esophageal obstruction: Secondary | ICD-10-CM

## 2017-04-06 DIAGNOSIS — Z7901 Long term (current) use of anticoagulants: Secondary | ICD-10-CM

## 2017-04-06 DIAGNOSIS — R1310 Dysphagia, unspecified: Secondary | ICD-10-CM

## 2017-04-06 NOTE — Progress Notes (Signed)
HISTORY OF PRESENT ILLNESS:  Laurie Pugh is a 83 y.o. female with multiple medical problems including paroxysmal atrial fibrillation for which she is on chronic therapy. I saw the patient 01/30/2017 for an acute food impaction for which I relieved endoscopically. At upper endoscopy earlier today with Dr. Lavon Paganini with findings concerning for malignancy. Fortunately, only food bolus with underlying stricture. She was to follow-up in the office last month but had car trouble. She presented today with her daughter. She continues on PPI daily. She has a modified soft diet which she is hoping to advance. Requesting esophageal dilation as we have discussed previously. Patient's GI review of systems is negative except for bloating. She has been stable from a cardiac standpoint. She sees Dr. Rennis Golden  REVIEW OF SYSTEMS:  All non-GI ROS negative except for sinus allergy, irregular heartbeat  Past Medical History:  Diagnosis Date  . Angina pectoris, unstable (HCC)   . Atrial fibrillation (HCC)   . Dyslipidemia   . Dysrhythmia   . Esophageal stricture   . GERD (gastroesophageal reflux disease)   . Hyperlipidemia   . Hypertension   . Neck pain   . PAF (paroxysmal atrial fibrillation) (HCC)   . PSVT (paroxysmal supraventricular tachycardia) (HCC)     Past Surgical History:  Procedure Laterality Date  . ABDOMINAL HYSTERECTOMY    . APPENDECTOMY    . BLADDER SURGERY     bladder surgery  . BREAST EXCISIONAL BIOPSY    . CARDIAC CATHETERIZATION  06/28/2004   no significant CAD (Dr. Laurell Josephs)  . CYSTOSCOPY W/ RETROGRADES Bilateral 04/11/2016   Procedure: CYSTOSCOPY WITH BILATERAL RETROGRADE PYELOGRAM;  Surgeon: Hildred Laser, MD;  Location: [facility];  Service: Urology;  Laterality: Bilateral;  . ESOPHAGOGASTRODUODENOSCOPY N/A 01/30/2017   Procedure: ESOPHAGOGASTRODUODENOSCOPY (EGD);  Surgeon: Hilarie Fredrickson, MD;  Location: [facility];  Service: Endoscopy;  Laterality: N/A;  .  ESOPHAGOGASTRODUODENOSCOPY N/A 01/30/2017   Procedure: ESOPHAGOGASTRODUODENOSCOPY (EGD);  Surgeon: Napoleon Form, MD;  Location: [facility];  Service: Endoscopy;  Laterality: N/A;  . TRANSTHORACIC ECHOCARDIOGRAM  2009   borderline conc LVH; trace MR; mild-mod TR, RVSP 30-77mmHg    Social History Laurie Pugh  reports that she quit smoking about 13 years ago. She has a 40.00 pack-year smoking history. She has never used smokeless tobacco. She reports that she does not drink alcohol or use drugs.  family history includes COPD in her sister; Cancer in her sister; Heart disease in her mother and sister; Suicidality in her brother and child.  Allergies  Allergen Reactions  . Other Other (See Comments)    Mycins - cannot recall whether azithromycin, e-mycin (REACTION: unknown)  . Codeine Nausea Only       PHYSICAL EXAMINATION: Vital signs: BP 138/90   Pulse 72   Ht 5\' 3"  (1.6 m)   Wt 124 lb 4 oz (56.4 kg)   BMI 22.01 kg/m   Constitutional: generally well-appearing, no acute distress Psychiatric: alert and oriented x3, cooperative Eyes: extraocular movements intact, anicteric, conjunctiva pink Mouth: oral pharynx moist, no lesions Neck: supple no lymphadenopathy Cardiovascular: heart irregular rate and rhythm, no murmur Lungs: clear to auscultation bilaterally Abdomen: soft, nontender, nondistended, no obvious ascites, no peritoneal signs, normal bowel sounds, no organomegaly Rectal: Omitted Extremities: no lower extremity edema bilaterally Skin: no lesions on visible extremities Neuro: No focal deficits. Cranial nerves intact  ASSESSMENT:  #1. GERD, complicated by peptic stricture #2. Recent acute food impaction #3. multiple medical problems and including atrial fibrillation  for which she is on chronic and coagulation. CHADSVASC score 3   PLAN:  #1. Reflux precautions #2. Continue PPI #3. Schedule upper endoscopy with esophageal dilation. The patient is HIGH  RISK given her age, comorbidities, and the need to address anti-coagulation therapy with its associated risks.The nature of the procedure, as well as the risks, benefits, and alternatives were carefully and thoroughly reviewed with the patient. Ample time for discussion and questions allowed. The patient understood, was satisfied, and agreed to proceed. #4. Hold Eliquis 2-3 days prior to therapeutic endoscopy. Confer with Dr. Rennis GoldenHilty

## 2017-04-06 NOTE — Patient Instructions (Signed)

## 2017-04-06 NOTE — Telephone Encounter (Signed)
  04/06/2017   RE: Laurie Pugh DOB: [birth year] MRN: [id number]   Dear Dr. Rennis GoldenHilty,    We have scheduled the above patient for an endoscopic procedure. Our records show that she is on anticoagulation therapy.   Please advise as to how long the patient may come off her therapy of Eliquis prior to the procedure, which is scheduled for 04/11/2017.  Please fax back/ or route the completed form to [place] at [phone].   Sincerely,    Gracy RacerWrenn, Jelissa Espiritu Kester

## 2017-04-07 NOTE — Telephone Encounter (Signed)
Mor[facility]ley [facility] called and stated that per Dr. Timothy Lassousso, who is covering for Dr. Waynard EdwardsPerini, patient could hold her Eliquis for 36 hours.  Relayed this information to patient.  Patient agreed.

## 2017-04-07 NOTE — Telephone Encounter (Signed)
Called Dr. Blanchie DessertHilty's office to follow up on clearance for patient to come off Eliquis and office is closed until Monday.  Spoke to patient who was already planning on stopping it 2 days prior and she told me that Dr. Waynard EdwardsPerini is the one who actually prescribes it.  [facility] and left message with Morrie Sheldonshley to see if they would be able to give clearance.

## 2017-04-11 ENCOUNTER — Ambulatory Visit (AMBULATORY_SURGERY_CENTER): Payer: Medicare Other | Admitting: Internal Medicine

## 2017-04-11 ENCOUNTER — Encounter: Payer: Self-pay | Admitting: Internal Medicine

## 2017-04-11 VITALS — BP 117/75 | HR 80 | Temp 98.0°F | Resp 19 | Ht 63.0 in | Wt 124.0 lb

## 2017-04-11 DIAGNOSIS — R1310 Dysphagia, unspecified: Secondary | ICD-10-CM

## 2017-04-11 DIAGNOSIS — K219 Gastro-esophageal reflux disease without esophagitis: Secondary | ICD-10-CM | POA: Diagnosis not present

## 2017-04-11 DIAGNOSIS — K222 Esophageal obstruction: Secondary | ICD-10-CM

## 2017-04-11 MED ORDER — SODIUM CHLORIDE 0.9 % IV SOLN: 500.0000 mL | INTRAVENOUS | Status: DC

## 2017-04-11 NOTE — Progress Notes (Signed)
Spontaneous respirations throughout. VSS. Resting comfortably. To PACU on room air. Report to  Jane RN. 

## 2017-04-11 NOTE — Progress Notes (Signed)
Called to room to assist during endoscopic procedure.  Patient ID and intended procedure confirmed with present staff. Received instructions for my participation in the procedure from the performing physician.  

## 2017-04-11 NOTE — Patient Instructions (Signed)
YOU HAD AN ENDOSCOPIC PROCEDURE TODAY AT THE [facility]:   Refer to the procedure report that was given to you for any specific questions about what was found during the examination.  If the procedure report does not answer your questions, please call your gastroenterologist to clarify.  If you requested that your care partner not be given the details of your procedure findings, then the procedure report has been included in a sealed envelope for you to review at your convenience later.  YOU SHOULD EXPECT: Some feelings of bloating in the abdomen. Passage of more gas than usual.  Walking can help get rid of the air that was put into your GI tract during the procedure and reduce the bloating. If you had a lower endoscopy (such as a colonoscopy or flexible sigmoidoscopy) you may notice spotting of blood in your stool or on the toilet paper. If you underwent a bowel prep for your procedure, you may not have a normal bowel movement for a few days.  Please Note:  You might notice some irritation and congestion in your nose or some drainage.  This is from the oxygen used during your procedure.  There is no need for concern and it should clear up in a day or so.  SYMPTOMS TO REPORT IMMEDIATELY:   Following upper endoscopy (EGD)  Vomiting of blood or coffee ground material  New chest pain or pain under the shoulder blades  Painful or persistently difficult swallowing  New shortness of breath  Fever of 100F or higher  Black, tarry-looking stools  For urgent or emergent issues, a gastroenterologist can be reached at any hour by calling [phone].   DIET:  We do recommend a small meal at first, but then you may proceed to your regular diet.  Drink plenty of fluids but you should avoid alcoholic beverages for 24 hours.  ACTIVITY:  You should plan to take it easy for the rest of today and you should NOT DRIVE or use heavy machinery until tomorrow (because of the sedation medicines used  during the test).    FOLLOW UP: Our staff will call the number listed on your records the next business day following your procedure to check on you and address any questions or concerns that you may have regarding the information given to you following your procedure. If we do not reach you, we will leave a message.  However, if you are feeling well and you are not experiencing any problems, there is no need to return our call.  We will assume that you have returned to your regular daily activities without incident.  If any biopsies were taken you will be contacted by phone or by letter within the next 1-3 weeks.  Please call us at [phone] if you have not heard about the biopsies in 3 weeks.    SIGNATURES/CONFIDENTIALITY: You and/or your care partner have signed paperwork which will be entered into your electronic medical record.  These signatures attest to the fact that that the information above on your After Visit Summary has been reviewed and is understood.  Full responsibility of the confidentiality of this discharge information lies with you and/or your care-partner.  Stricture and post dilation diet given with instructions. Resume eliquis tomorrow.

## 2017-04-11 NOTE — Op Note (Signed)
[facility] Patient Name: Dynisha Due Procedure Date: 04/11/2017 12:07 PM MRN: [id number] Endoscopist: Wilhemina Bonito. Marina Goodell , MD Age: 83 Referring MD:  Date of Birth: 25-Jul-1932 Gender: Female Account #: [id number] Procedure:                Upper GI endoscopy, with balloon dilation of the                            esophagus?"20 mm max Indications:              Dysphagia, Therapeutic procedure Medicines:                Monitored Anesthesia Care Procedure:                Pre-Anesthesia Assessment:                           - Prior to the procedure, a History and Physical                            was performed, and patient medications and                            allergies were reviewed. The patient's tolerance of                            previous anesthesia was also reviewed. The risks                            and benefits of the procedure and the sedation                            options and risks were discussed with the patient.                            All questions were answered, and informed consent                            was obtained. Prior Anticoagulants: The patient has                            taken Eliquis (apixaban), last dose was 3 days                            prior to procedure. ASA Grade Assessment: III - A                            patient with severe systemic disease. After                            reviewing the risks and benefits, the patient was                            deemed in satisfactory condition to undergo the  procedure.                           After obtaining informed consent, the endoscope was                            passed under direct vision. Throughout the                            procedure, the patient's blood pressure, pulse, and                            oxygen saturations were monitored continuously. The                            Endoscope was introduced through the mouth, and                         advanced to the second part of duodenum. The upper                            GI endoscopy was accomplished without difficulty.                            The patient tolerated the procedure well. Scope In: Scope Out: Findings:                 The esophagus was tortuous and foreshortened the                            gastroesophageal junction at approximately 31 cm.                            One dominant mild benign-appearing, intrinsic                            stenosis was found 31 cm from the incisors. There                            are several partial strictures or webs above this                            area in the distal esophagus. The distal stricture                            measured 1.5 cm (inner diameter). A TTS dilator was                            passed through the scope. Dilation with an 18-19-20                            mm balloon dilator was performed to 20 mm. There  was local trauma at the site of the stricture as                            well as very superficial linear laceration across                            the less dominant strictured area. Minor oozing of                            blood noted. Tolerated well.                           The exam of the esophagus was otherwise normal.                           The stomach revealed a large slightly tortuous                            hiatal hernia without obstruction measuring                            approximately 6-7 cm. No other gastric                            abnormalities.                           The examined duodenum was normal.                           The cardia and gastric fundus were normal on                            retroflexion, save previously mentioned                            abnormalities. Complications:            No immediate complications. Estimated Blood Loss:     Estimated blood loss: none. Impression:               -  Benign-appearing esophageal stenosis as                            described. Dilated.                           - Normal stomach, save large hiatal hernia.                           - Normal examined duodenum.                           - No specimens collected. Recommendation:           - Patient has a contact number available for  emergencies. The signs and symptoms of potential                            delayed complications were discussed with the                            patient. Return to normal activities tomorrow.                            Written discharge instructions were provided to the                            patient.                           - Post dilation diet.                           - Continue present medications. Continue omeprazole                            40 mg daily.                           - Resume Eliquis (apixaban) at prior dose tomorrow.                           - May resume more normal diet starting tomorrow.                            Always chew food exceptionally well.                           - Contact Dr. Marina Goodell if you have any significant                            recurrence of swallowing issues Sahalie Beth N. Marina Goodell, MD 04/11/2017 12:31:52 PM This report has been signed electronically.

## 2017-04-12 ENCOUNTER — Telehealth: Payer: Self-pay | Admitting: *Deleted

## 2017-04-12 ENCOUNTER — Encounter (HOSPITAL_COMMUNITY): Payer: Medicare Other

## 2017-04-12 NOTE — Telephone Encounter (Signed)
  Follow up Call-  Call back number 04/11/2017  Post procedure Call Back phone  # [phone]  Permission to leave phone message Yes  Some recent data might be hidden     Patient questions:  Message left to call us if necessary.

## 2017-04-12 NOTE — Telephone Encounter (Signed)
  Follow up Call-  Call back number 04/11/2017  Post procedure Call Back phone  # [phone]  Permission to leave phone message Yes  Some recent data might be hidden    [facility]

## 2017-04-14 ENCOUNTER — Other Ambulatory Visit: Payer: Self-pay

## 2017-04-14 DIAGNOSIS — I6529 Occlusion and stenosis of unspecified carotid artery: Secondary | ICD-10-CM

## 2017-04-21 ENCOUNTER — Other Ambulatory Visit (HOSPITAL_COMMUNITY): Payer: Self-pay | Admitting: Internal Medicine

## 2017-04-21 DIAGNOSIS — I6529 Occlusion and stenosis of unspecified carotid artery: Secondary | ICD-10-CM

## 2017-04-24 ENCOUNTER — Ambulatory Visit (HOSPITAL_COMMUNITY)
Admission: RE | Admit: 2017-04-24 | Discharge: 2017-04-24 | Disposition: A | Discharge status: Home / Self Care | Payer: Medicare Other | Source: Ambulatory Visit | Attending: Vascular Surgery | Admitting: Vascular Surgery

## 2017-04-24 DIAGNOSIS — I6529 Occlusion and stenosis of unspecified carotid artery: Secondary | ICD-10-CM | POA: Diagnosis not present

## 2017-04-24 DIAGNOSIS — I6523 Occlusion and stenosis of bilateral carotid arteries: Secondary | ICD-10-CM | POA: Insufficient documentation

## 2017-04-24 LAB — VAS US CAROTID
LCCADDIAS: 10 cm/s
LCCAPSYS: 41 cm/s
LEFT ECA DIAS: -15 cm/s
Left CCA dist sys: 34 cm/s
Left CCA prox dias: 10 cm/s
Left ICA dist dias: -16 cm/s
Left ICA dist sys: -37 cm/s
Left ICA prox dias: -17 cm/s
Left ICA prox sys: -40 cm/s
RCCADSYS: -37 cm/s
RCCAPSYS: 79 cm/s
RIGHT CCA MID DIAS: 19 cm/s
RIGHT ECA DIAS: -12 cm/s
Right CCA prox dias: 13 cm/s

## 2017-05-18 NOTE — Addendum Note (Signed)
Addendum  created 05/18/17 1405 by Ulyses Panico, MD   Sign clinical note    

## 2017-05-21 ENCOUNTER — Observation Stay (HOSPITAL_COMMUNITY)
Admission: EM | Admit: 2017-05-21 | Discharge: 2017-05-22 | Disposition: A | Discharge status: Home / Self Care | Payer: Medicare Other | Attending: Internal Medicine | Admitting: Internal Medicine

## 2017-05-21 ENCOUNTER — Emergency Department (HOSPITAL_COMMUNITY): Payer: Medicare Other

## 2017-05-21 ENCOUNTER — Other Ambulatory Visit: Payer: Self-pay

## 2017-05-21 ENCOUNTER — Encounter (HOSPITAL_COMMUNITY): Payer: Self-pay | Admitting: Emergency Medicine

## 2017-05-21 ENCOUNTER — Inpatient Hospital Stay (HOSPITAL_COMMUNITY): Payer: Medicare Other

## 2017-05-21 DIAGNOSIS — M25522 Pain in left elbow: Secondary | ICD-10-CM | POA: Insufficient documentation

## 2017-05-21 DIAGNOSIS — I10 Essential (primary) hypertension: Secondary | ICD-10-CM | POA: Insufficient documentation

## 2017-05-21 DIAGNOSIS — Z87891 Personal history of nicotine dependence: Secondary | ICD-10-CM | POA: Diagnosis not present

## 2017-05-21 DIAGNOSIS — S0101XA Laceration without foreign body of scalp, initial encounter: Secondary | ICD-10-CM | POA: Diagnosis not present

## 2017-05-21 DIAGNOSIS — S065X0A Traumatic subdural hemorrhage without loss of consciousness, initial encounter: Principal | ICD-10-CM | POA: Insufficient documentation

## 2017-05-21 DIAGNOSIS — S065X9A Traumatic subdural hemorrhage with loss of consciousness of unspecified duration, initial encounter: Secondary | ICD-10-CM

## 2017-05-21 DIAGNOSIS — M79671 Pain in right foot: Secondary | ICD-10-CM | POA: Insufficient documentation

## 2017-05-21 DIAGNOSIS — E785 Hyperlipidemia, unspecified: Secondary | ICD-10-CM | POA: Insufficient documentation

## 2017-05-21 DIAGNOSIS — S065XAA Traumatic subdural hemorrhage with loss of consciousness status unknown, initial encounter: Secondary | ICD-10-CM | POA: Diagnosis present

## 2017-05-21 DIAGNOSIS — M25512 Pain in left shoulder: Secondary | ICD-10-CM | POA: Insufficient documentation

## 2017-05-21 DIAGNOSIS — I700 Atherosclerosis of aorta: Secondary | ICD-10-CM | POA: Insufficient documentation

## 2017-05-21 DIAGNOSIS — Y92009 Unspecified place in unspecified non-institutional (private) residence as the place of occurrence of the external cause: Secondary | ICD-10-CM | POA: Diagnosis not present

## 2017-05-21 DIAGNOSIS — I480 Paroxysmal atrial fibrillation: Secondary | ICD-10-CM | POA: Diagnosis not present

## 2017-05-21 DIAGNOSIS — K219 Gastro-esophageal reflux disease without esophagitis: Secondary | ICD-10-CM | POA: Insufficient documentation

## 2017-05-21 DIAGNOSIS — Z7901 Long term (current) use of anticoagulants: Secondary | ICD-10-CM | POA: Insufficient documentation

## 2017-05-21 DIAGNOSIS — I482 Chronic atrial fibrillation: Secondary | ICD-10-CM | POA: Diagnosis not present

## 2017-05-21 DIAGNOSIS — W101XXA Fall (on)(from) sidewalk curb, initial encounter: Secondary | ICD-10-CM | POA: Diagnosis not present

## 2017-05-21 DIAGNOSIS — I6200 Nontraumatic subdural hemorrhage, unspecified: Secondary | ICD-10-CM

## 2017-05-21 DIAGNOSIS — I4891 Unspecified atrial fibrillation: Secondary | ICD-10-CM | POA: Diagnosis present

## 2017-05-21 DIAGNOSIS — S0990XA Unspecified injury of head, initial encounter: Secondary | ICD-10-CM | POA: Diagnosis present

## 2017-05-21 LAB — CBC WITH DIFFERENTIAL/PLATELET
BASOS PCT: 0 %
Basophils Absolute: 0 10*3/uL (ref 0.0–0.1)
EOS PCT: 1 %
Eosinophils Absolute: 0.1 10*3/uL (ref 0.0–0.7)
HCT: 42.4 % (ref 36.0–46.0)
Hemoglobin: 14.1 g/dL (ref 12.0–15.0)
LYMPHS PCT: 8 %
Lymphs Abs: 1.1 10*3/uL (ref 0.7–4.0)
MCH: 30.5 pg (ref 26.0–34.0)
MCHC: 33.3 g/dL (ref 30.0–36.0)
MCV: 91.8 fL (ref 78.0–100.0)
Monocytes Absolute: 1.1 10*3/uL — ABNORMAL HIGH (ref 0.1–1.0)
Monocytes Relative: 8 %
NEUTROS ABS: 11.6 10*3/uL — AB (ref 1.7–7.7)
Neutrophils Relative %: 83 %
PLATELETS: 177 10*3/uL (ref 150–400)
RBC: 4.62 MIL/uL (ref 3.87–5.11)
RDW: 14 % (ref 11.5–15.5)
WBC: 13.8 10*3/uL — AB (ref 4.0–10.5)

## 2017-05-21 LAB — URINALYSIS, ROUTINE W REFLEX MICROSCOPIC
Bilirubin Urine: NEGATIVE
GLUCOSE, UA: NEGATIVE mg/dL
HGB URINE DIPSTICK: NEGATIVE
KETONES UR: NEGATIVE mg/dL
LEUKOCYTES UA: NEGATIVE
NITRITE: NEGATIVE
PROTEIN: 30 mg/dL — AB
Specific Gravity, Urine: 1.012 (ref 1.005–1.030)
pH: 8 (ref 5.0–8.0)

## 2017-05-21 LAB — APTT: APTT: 28 s (ref 24–36)

## 2017-05-21 LAB — BASIC METABOLIC PANEL
Anion gap: 10 (ref 5–15)
BUN: 11 mg/dL (ref 6–20)
CALCIUM: 9.2 mg/dL (ref 8.9–10.3)
CO2: 28 mmol/L (ref 22–32)
CREATININE: 0.65 mg/dL (ref 0.44–1.00)
Chloride: 100 mmol/L — ABNORMAL LOW (ref 101–111)
Glucose, Bld: 123 mg/dL — ABNORMAL HIGH (ref 65–99)
Potassium: 3.5 mmol/L (ref 3.5–5.1)
SODIUM: 138 mmol/L (ref 135–145)

## 2017-05-21 LAB — TYPE AND SCREEN
ABO/RH(D): O POS
Antibody Screen: NEGATIVE

## 2017-05-21 LAB — PROTIME-INR
INR: 1.12
PROTHROMBIN TIME: 14.5 s (ref 11.4–15.2)

## 2017-05-21 LAB — MRSA PCR SCREENING: MRSA by PCR: NEGATIVE

## 2017-05-21 LAB — HEPARIN LEVEL (UNFRACTIONATED): HEPARIN UNFRACTIONATED: 1.74 [IU]/mL — AB (ref 0.30–0.70)

## 2017-05-21 MED ORDER — ACETAMINOPHEN 325 MG PO TABS
650.0000 mg | ORAL_TABLET | Freq: Four times a day (QID) | ORAL | Status: DC | PRN
  Administered 2017-05-21: 650 mg via ORAL
  Filled 2017-05-21: qty 2

## 2017-05-21 MED ORDER — PROTHROMBIN COMPLEX CONC HUMAN 500 UNITS IV KIT
2675.0000 [IU] | PACK | Status: DC
  Filled 2017-05-21: qty 107

## 2017-05-21 MED ORDER — ONDANSETRON HCL 4 MG/2ML IJ SOLN: 4.0000 mg | Freq: Four times a day (QID) | INTRAMUSCULAR | Status: DC | PRN

## 2017-05-21 MED ORDER — SODIUM CHLORIDE 0.9% FLUSH: 3.0000 mL | Freq: Two times a day (BID) | INTRAVENOUS | Status: DC

## 2017-05-21 MED ORDER — LIDOCAINE-EPINEPHRINE (PF) 2 %-1:200000 IJ SOLN
10.0000 mL | Freq: Once | INTRAMUSCULAR | Status: AC
  Administered 2017-05-21: 10 mL
  Filled 2017-05-21: qty 20

## 2017-05-21 MED ORDER — ACETAMINOPHEN 650 MG RE SUPP: 650.0000 mg | Freq: Four times a day (QID) | RECTAL | Status: DC | PRN

## 2017-05-21 MED ORDER — TETANUS-DIPHTH-ACELL PERTUSSIS 5-2.5-18.5 LF-MCG/0.5 IM SUSP
0.5000 mL | Freq: Once | INTRAMUSCULAR | Status: AC
  Administered 2017-05-22: 0.5 mL via INTRAMUSCULAR
  Filled 2017-05-21: qty 0.5

## 2017-05-21 MED ORDER — ONDANSETRON HCL 4 MG PO TABS: 4.0000 mg | ORAL_TABLET | Freq: Four times a day (QID) | ORAL | Status: DC | PRN

## 2017-05-21 MED ORDER — METOPROLOL TARTRATE 25 MG PO TABS
25.0000 mg | ORAL_TABLET | Freq: Two times a day (BID) | ORAL | Status: DC
  Administered 2017-05-21: 25 mg via ORAL
  Filled 2017-05-21: qty 1

## 2017-05-21 MED ORDER — SODIUM CHLORIDE 0.9 % IV SOLN
INTRAVENOUS | Status: AC
  Administered 2017-05-21: 19:00:00 via INTRAVENOUS

## 2017-05-21 MED ORDER — PROTHROMBIN COMPLEX CONC HUMAN 500 UNITS IV KIT
2677.0000 [IU] | PACK | Status: AC
  Administered 2017-05-21: 2677 [IU] via INTRAVENOUS
  Filled 2017-05-21: qty 107.08

## 2017-05-21 MED ORDER — OXYCODONE HCL 5 MG PO TABS: 5.0000 mg | ORAL_TABLET | ORAL | Status: DC | PRN

## 2017-05-21 NOTE — ED Notes (Signed)
Dr. James at bedside  

## 2017-05-21 NOTE — Progress Notes (Addendum)
05/21/2017 Rn was told patient was in [facility] emergency and eventually came to [facility]. Patient is alert, oriented and ambulatory. She arrived at [facility] and came to [place] at 1815. Rn receive report from [facility] when patient arrive on unit. Patient fell at home and stitch was place on the back of patient head. Patient hair had blood. On the left upper shoulder she had an abrasion and on right lower leg from the fall. Patient bottom is pink and discolored and have some little bumps. Bruises on arms , discoloration lower legs and heels dry. Patient c/o pain left shoulder from the fall. Receive CHG Bath and MRSA Swab. [facility] RN.

## 2017-05-21 NOTE — ED Triage Notes (Signed)
Pt from home following a fall. Pt denies dizziness causing fall. Pt does take eliquis. Pt denies changes in vision. Pt also has c/o right ankle pain and left shoulder pain. Pt has two small lacerations to head. Bleeding is controlled. One is superficial and about 29mm, the other is deeper and is about 1cm in size. Pt also has a gash to her left ankle that is about 1.5 cm. Bleeding is controlled there as well. Pt is ambulatory

## 2017-05-21 NOTE — Consult Note (Signed)
Reason for Consult:closed head injury small right frontal subdural hematoma Referring Physician: emergency room and Dr. [name] is an 83 y.o. female.  HPI: 83 year old female who fell around noon today striking the back of her left side of her head. She denies any loss of consciousness she currently is not complaining of a headache just burning around the laceration. Denies any numbness and tingling once the body other any neck pain.  Past Medical History:  Diagnosis Date  . Allergy   . Angina pectoris, unstable (HCC)   . Atrial fibrillation (HCC)   . Cataract   . Dyslipidemia   . Dysrhythmia   . Esophageal stricture   . GERD (gastroesophageal reflux disease)   . Hyperlipidemia   . Hypertension   . Neck pain   . PAF (paroxysmal atrial fibrillation) (HCC)   . PSVT (paroxysmal supraventricular tachycardia) (HCC)     Past Surgical History:  Procedure Laterality Date  . ABDOMINAL HYSTERECTOMY    . APPENDECTOMY    . BLADDER SURGERY     bladder surgery  . BREAST EXCISIONAL BIOPSY    . CARDIAC CATHETERIZATION  06/28/2004   no significant CAD (Dr. Laurell Josephs)  . CATARACT EXTRACTION, BILATERAL  2008  . CYSTOSCOPY W/ RETROGRADES Bilateral 04/11/2016   Procedure: CYSTOSCOPY WITH BILATERAL RETROGRADE PYELOGRAM;  Surgeon: Hildred Laser, MD;  Location: [facility];  Service: Urology;  Laterality: Bilateral;  . ESOPHAGOGASTRODUODENOSCOPY N/A 01/30/2017   Procedure: ESOPHAGOGASTRODUODENOSCOPY (EGD);  Surgeon: Hilarie Fredrickson, MD;  Location: [facility];  Service: Endoscopy;  Laterality: N/A;  . ESOPHAGOGASTRODUODENOSCOPY N/A 01/30/2017   Procedure: ESOPHAGOGASTRODUODENOSCOPY (EGD);  Surgeon: Napoleon Form, MD;  Location: [facility];  Service: Endoscopy;  Laterality: N/A;  . TRANSTHORACIC ECHOCARDIOGRAM  2009   borderline conc LVH; trace MR; mild-mod TR, RVSP 30-39mmHg    Family History  Problem Relation Age of Onset  . Heart disease Mother   . Heart disease Sister   .  Suicidality Brother   . Cancer Sister   . COPD Sister   . Suicidality Child   . Colon cancer Neg Hx   . Colon polyps Neg Hx   . Esophageal cancer Neg Hx   . Rectal cancer Neg Hx   . Stomach cancer Neg Hx     Social History:  reports that she quit smoking about 13 years ago. She has a 40.00 pack-year smoking history. She has never used smokeless tobacco. She reports that she does not drink alcohol or use drugs.  Allergies:  Allergies  Allergen Reactions  . Other Other (See Comments)    Mycins - cannot recall whether azithromycin, e-mycin (REACTION: unknown)  . Codeine Nausea Only    Medications: I have reviewed the patient's current medications.  No results found for this or any previous visit (from the past 48 hour(s)).  Dg Elbow Complete Left  Result Date: 05/21/2017 CLINICAL DATA:  83 year old female status post fall outside at home. Left shoulder, elbow and right lateral foot pain. EXAM: LEFT ELBOW - COMPLETE 3+ VIEW COMPARISON:  None. FINDINGS: Bone mineralization is within normal limits for age. Benign exostosis or osteo chondroma from the distal left humerus metadiaphysis directed anteriorly. Degenerative spurring about the coronoid and olecranon of the ulna. No elbow joint effusion identified. The radial head appears intact. No acute fracture or dislocation identified. IMPRESSION: No acute fracture or dislocation identified about the left elbow. Electronically Signed   By: Odessa Fleming M.D.   On: 05/21/2017 15:29   Ct  Head Wo Contrast  Result Date: 05/21/2017 CLINICAL DATA:  Larey Seat at home. On Eliquis. Scalp laceration. History of atrial fibrillation, hypertension and hyperlipidemia. EXAM: CT HEAD WITHOUT CONTRAST TECHNIQUE: Contiguous axial images were obtained from the base of the skull through the vertex without intravenous contrast. COMPARISON:  Skull radiographs May 22, 2006 FINDINGS: BRAIN: No intraparenchymal hemorrhage, mass effect nor midline shift. The ventricles and sulci  are normal for age. Patchy supratentorial white matter hypodensities less than expected for patient's age, though non-specific are most compatible with chronic small vessel ischemic disease. Old small RIGHT caudate lacunar infarct. No acute large vascular territory infarcts. 3 mm dense RIGHT frontal subdural hematoma. Basal cisterns are patent. VASCULAR: Mild to moderate calcific atherosclerosis of the carotid siphons. SKULL: No skull fracture. Large LEFT frontoparietal scalp hematoma without subcutaneous gas or radiopaque foreign bodies. SINUSES/ORBITS: Trace LEFT mastoid effusion. The included paranasal sinuses are well-aerated.The included ocular globes and orbital contents are non-suspicious. Status post bilateral ocular lens implants. OTHER: None. IMPRESSION: 1. Acute 3 mm RIGHT frontal subdural hematoma.  No midline shift. 2. Large LEFT scalp hematoma.  No skull fracture. 3. Mild chronic small vessel ischemic disease. Old RIGHT basal ganglia lacunar infarct. 4. Critical Value/emergent results were called by telephone at the time of interpretation on 05/21/2017 at 4:27 pm to Dr. Rolland Porter , who verbally acknowledged these results. Electronically Signed   By: Awilda Metro M.D.   On: 05/21/2017 16:31   Dg Shoulder Left  Result Date: 05/21/2017 CLINICAL DATA:  83 year old female status post fall outside at home. Left shoulder, elbow and right lateral foot pain. EXAM: LEFT SHOULDER - 2+ VIEW COMPARISON:  Chest radiographs 01/29/2017. FINDINGS: No glenohumeral joint dislocation. Intact proximal left humerus. No left clavicle or scapula fracture identified. Calcified aortic atherosclerosis. Visible left ribs and lung parenchyma appear stable. IMPRESSION: No acute fracture or dislocation identified about the left shoulder. Electronically Signed   By: Odessa Fleming M.D.   On: 05/21/2017 15:26   Dg Foot Complete Right  Result Date: 05/21/2017 CLINICAL DATA:  83 year old female status post fall outside at home.  Left shoulder, elbow and right lateral foot pain. EXAM: RIGHT FOOT COMPLETE - 3+ VIEW COMPARISON:  None. FINDINGS: Bone mineralization is within normal limits for age. Calcaneus appears intact with mild dorsal degenerative spurring. Tarsal bone alignment and joint spaces within normal limits. Possible tiny avulsion fracture at the base of the fifth metatarsal. No other acute fracture or dislocation identified. Joint spaces are normal for age. IMPRESSION: 1. Possible tiny avulsion fracture at the base of the fifth metatarsal. 2. No other acute osseous abnormality identified. Electronically Signed   By: Odessa Fleming M.D.   On: 05/21/2017 15:27    Review of Systems  Neurological: Positive for headaches.   Blood pressure (!) 167/103, pulse (!) 112, temperature 98.8 F (37.1 C), temperature source Oral, resp. rate 16, weight 57.9 kg (127 lb 9.6 oz), SpO2 93 %. Physical Exam  Constitutional: She is oriented to person, place, and time.  Neurological: She is alert and oriented to person, place, and time. She has normal strength. GCS eye subscore is 4. GCS verbal subscore is 5. GCS motor subscore is 6.  Patient is awake and alert moves all extremities well strength 5 out of 5 his small punctate laceration left parietal    Assessment/Plan: 83 year old female with a skim right frontal subdural hematoma. Patient is on Eliquis.   patient will be initiated on the rapid reversal protocol and be given Vitamin  K initially. The ER will suture the laceration and patient will be transferred to [facility] to the stepdown or ICU for observation on the medicine service with follow-up CT scan in 6 hours. Patient should be continued in the rapid reversal protocol.  Laurie Pugh P 05/21/2017, 5:29 PM

## 2017-05-21 NOTE — H&P (Signed)
[facility] History and Physical  Laurie Pugh TDS:[id number] DOB: 06/28/1932 DOA: 05/21/2017   PCP: Rodrigo Ran, MD  Specialists: Dr. Rennis Golden is her cardiologist. Her gastroenterologist is Dr. Marina Goodell. She is also followed by urology for history of urinary bladder stones.  Chief Complaint: Fall  HPI: Laurie Pugh is a 83 y.o. female with a past medical history of atrial fibrillation on anticoagulation, hypertension, history of urinary bladder stones who was in her usual state of health when she slipped on a step after she was returning from church and fell backwards on her head. Denies any dizziness, chest pain, shortness of breath, either prior to or after the episode. No loss of consciousness. Denies any weakness on any one side of her body. No nausea, vomiting. No recent fever or chills. No difficulty with urinating. No change in bowel habits recently. She complains of a burning sensation at the site of her injury on her head. She also hurt her left shoulder. She could not get up from the floor and had to be helped up.   In the emergency department CT head was done which revealed a 3 mm right frontal subdural hematoma. Due to the fact that she is on oral anticoagulation with Eliquis as she will need further management. She will need hospitalization.  Home Medications: Prior to Admission medications   Medication Sig Start Date End Date Taking? Authorizing Provider  atenolol (TENORMIN) 25 MG tablet TAKE 1 TABLET BY MOUTH  DAILY Patient taking differently: TAKE 25 MG BY MOUTH DAILY 01/11/17   Hilty, Lisette Abu, MD  benazepril (LOTENSIN) 20 MG tablet Take 20 mg by mouth daily.  05/30/13   [provider]  ELIQUIS 5 MG TABS tablet 0.5 mg 2 (two) times daily. 01/11/17   [provider]  gabapentin (NEURONTIN) 100 MG capsule 3 (three) times daily. 03/21/17   [provider]  LORazepam (ATIVAN) 1 MG tablet Take 1 mg by mouth 2 (two) times daily.  05/30/13    [provider]  omeprazole (PRILOSEC) 40 MG capsule Take 1 capsule (40 mg total) by mouth daily. 01/31/17 01/31/18  Edsel Petrin, DO  rosuvastatin (CRESTOR) 10 MG tablet Take 10 mg by mouth daily.    [provider]    Allergies:  Allergies  Allergen Reactions  . Other Other (See Comments)    Mycins - cannot recall whether azithromycin, e-mycin (REACTION: unknown)  . Codeine Nausea Only    Past Medical History: Past Medical History:  Diagnosis Date  . Allergy   . Angina pectoris, unstable (HCC)   . Atrial fibrillation (HCC)   . Cataract   . Dyslipidemia   . Dysrhythmia   . Esophageal stricture   . GERD (gastroesophageal reflux disease)   . Hyperlipidemia   . Hypertension   . Neck pain   . PAF (paroxysmal atrial fibrillation) (HCC)   . PSVT (paroxysmal supraventricular tachycardia) (HCC)     Past Surgical History:  Procedure Laterality Date  . ABDOMINAL HYSTERECTOMY    . APPENDECTOMY    . BLADDER SURGERY     bladder surgery  . BREAST EXCISIONAL BIOPSY    . CARDIAC CATHETERIZATION  06/28/2004   no significant CAD (Dr. Laurell Josephs)  . CATARACT EXTRACTION, BILATERAL  2008  . CYSTOSCOPY W/ RETROGRADES Bilateral 04/11/2016   Procedure: CYSTOSCOPY WITH BILATERAL RETROGRADE PYELOGRAM;  Surgeon: Hildred Laser, MD;  Location: [facility];  Service: Urology;  Laterality: Bilateral;  . ESOPHAGOGASTRODUODENOSCOPY N/A 01/30/2017   Procedure: ESOPHAGOGASTRODUODENOSCOPY (EGD);  Surgeon: Hilarie Fredrickson, MD;  Location: [facility];  Service: Endoscopy;  Laterality: N/A;  . ESOPHAGOGASTRODUODENOSCOPY N/A 01/30/2017   Procedure: ESOPHAGOGASTRODUODENOSCOPY (EGD);  Surgeon: Napoleon Form, MD;  Location: [facility];  Service: Endoscopy;  Laterality: N/A;  . TRANSTHORACIC ECHOCARDIOGRAM  2009   borderline conc LVH; trace MR; mild-mod TR, RVSP 30-71mmHg    Social History: Lives in [place] by herself. Quit smoking 20 years ago. No alcohol use. No illicit drug use.  Usually independent with daily activities.   Family History:  Family History  Problem Relation Age of Onset  . Heart disease Mother   . Heart disease Sister   . Suicidality Brother   . Cancer Sister   . COPD Sister   . Suicidality Child   . Colon cancer Neg Hx   . Colon polyps Neg Hx   . Esophageal cancer Neg Hx   . Rectal cancer Neg Hx   . Stomach cancer Neg Hx      Review of Systems - History obtained from the patient General ROS: negative Psychological ROS: negative Ophthalmic ROS: negative ENT ROS: negative Allergy and Immunology ROS: negative Hematological and Lymphatic ROS: negative Endocrine ROS: negative Respiratory ROS: no cough, shortness of breath, or wheezing Cardiovascular ROS: no chest pain or dyspnea on exertion Gastrointestinal ROS: no abdominal pain, change in bowel habits, or black or bloody stools Genito-Urinary ROS: no dysuria, trouble voiding, or hematuria Musculoskeletal ROS: negative Neurological ROS: as in hpi Dermatological ROS: negative  Physical Examination  Vitals:   05/21/17 1439 05/21/17 1705 05/21/17 1710 05/21/17 1730  BP: (!) 160/76  (!) 167/103 (!) 147/80  Pulse: 98  (!) 112 100  Resp: 18  16 18   Temp: 98.8 F (37.1 C)     TempSrc: Oral     SpO2: 90%  93% 91%  Weight:  57.9 kg (127 lb 9.6 oz)      BP (!) 147/80   Pulse 100   Temp 98.8 F (37.1 C) (Oral)   Resp 18   Wt 57.9 kg (127 lb 9.6 oz)   SpO2 91%   BMI 22.60 kg/m   General appearance: alert, cooperative, appears stated age and no distress Head: Scalp laceration noted in the left temporal occipital area. Eyes: conjunctivae/corneas clear. PERRL, EOM's intact. Fundi benign. Throat: lips, mucosa, and tongue normal; teeth and gums normal Neck: no adenopathy, no carotid bruit, no JVD, supple, symmetrical, trachea midline and thyroid not enlarged, symmetric, no tenderness/mass/nodules Resp: clear to auscultation bilaterally Cardio: s1s2 is irregularly irregular. No S3,  S4. No rubs, murmurs or bruit GI: soft, non-tender; bowel sounds normal; no masses,  no organomegaly Extremities: extremities normal, atraumatic, no cyanosis or edema Pulses: 2+ and symmetric Skin: Skin color, texture, turgor normal. No rashes or lesions Lymph nodes: Cervical, supraclavicular, and axillary nodes normal. Neurologic: Awake, alert. Oriented 3. No facial asymmetry. Modest and equal bilateral upper and lower extremities.    Labs on Admission: I have personally reviewed following labs and imaging studies  CBC:  Recent Labs Lab 05/21/17 1700  WBC 13.8*  NEUTROABS 11.6*  HGB 14.1  HCT 42.4  MCV 91.8  PLT 177   Basic Metabolic Panel: No results for input(s): NA, K, CL, CO2, GLUCOSE, BUN, CREATININE, CALCIUM, MG, PHOS in the last 168 hours.   Radiological Exams on Admission: Dg Elbow Complete Left  Result Date: 05/21/2017 CLINICAL DATA:  83 year old female status post fall outside at home. Left shoulder, elbow and right lateral foot pain. EXAM: LEFT ELBOW -  COMPLETE 3+ VIEW COMPARISON:  None. FINDINGS: Bone mineralization is within normal limits for age. Benign exostosis or osteo chondroma from the distal left humerus metadiaphysis directed anteriorly. Degenerative spurring about the coronoid and olecranon of the ulna. No elbow joint effusion identified. The radial head appears intact. No acute fracture or dislocation identified. IMPRESSION: No acute fracture or dislocation identified about the left elbow. Electronically Signed   By: Odessa Fleming M.D.   On: 05/21/2017 15:29   Ct Head Wo Contrast  Result Date: 05/21/2017 CLINICAL DATA:  Larey Seat at home. On Eliquis. Scalp laceration. History of atrial fibrillation, hypertension and hyperlipidemia. EXAM: CT HEAD WITHOUT CONTRAST TECHNIQUE: Contiguous axial images were obtained from the base of the skull through the vertex without intravenous contrast. COMPARISON:  Skull radiographs May 22, 2006 FINDINGS: BRAIN: No intraparenchymal  hemorrhage, mass effect nor midline shift. The ventricles and sulci are normal for age. Patchy supratentorial white matter hypodensities less than expected for patient's age, though non-specific are most compatible with chronic small vessel ischemic disease. Old small RIGHT caudate lacunar infarct. No acute large vascular territory infarcts. 3 mm dense RIGHT frontal subdural hematoma. Basal cisterns are patent. VASCULAR: Mild to moderate calcific atherosclerosis of the carotid siphons. SKULL: No skull fracture. Large LEFT frontoparietal scalp hematoma without subcutaneous gas or radiopaque foreign bodies. SINUSES/ORBITS: Trace LEFT mastoid effusion. The included paranasal sinuses are well-aerated.The included ocular globes and orbital contents are non-suspicious. Status post bilateral ocular lens implants. OTHER: None. IMPRESSION: 1. Acute 3 mm RIGHT frontal subdural hematoma.  No midline shift. 2. Large LEFT scalp hematoma.  No skull fracture. 3. Mild chronic small vessel ischemic disease. Old RIGHT basal ganglia lacunar infarct. 4. Critical Value/emergent results were called by telephone at the time of interpretation on 05/21/2017 at 4:27 pm to Dr. Rolland Porter , who verbally acknowledged these results. Electronically Signed   By: Awilda Metro M.D.   On: 05/21/2017 16:31   Dg Shoulder Left  Result Date: 05/21/2017 CLINICAL DATA:  83 year old female status post fall outside at home. Left shoulder, elbow and right lateral foot pain. EXAM: LEFT SHOULDER - 2+ VIEW COMPARISON:  Chest radiographs 01/29/2017. FINDINGS: No glenohumeral joint dislocation. Intact proximal left humerus. No left clavicle or scapula fracture identified. Calcified aortic atherosclerosis. Visible left ribs and lung parenchyma appear stable. IMPRESSION: No acute fracture or dislocation identified about the left shoulder. Electronically Signed   By: Odessa Fleming M.D.   On: 05/21/2017 15:26   Dg Foot Complete Right  Result Date:  05/21/2017 CLINICAL DATA:  83 year old female status post fall outside at home. Left shoulder, elbow and right lateral foot pain. EXAM: RIGHT FOOT COMPLETE - 3+ VIEW COMPARISON:  None. FINDINGS: Bone mineralization is within normal limits for age. Calcaneus appears intact with mild dorsal degenerative spurring. Tarsal bone alignment and joint spaces within normal limits. Possible tiny avulsion fracture at the base of the fifth metatarsal. No other acute fracture or dislocation identified. Joint spaces are normal for age. IMPRESSION: 1. Possible tiny avulsion fracture at the base of the fifth metatarsal. 2. No other acute osseous abnormality identified. Electronically Signed   By: Odessa Fleming M.D.   On: 05/21/2017 15:27    My interpretation of Electrocardiogram: Atrial fibrillation at 95 bpm. Normal axis. No concerning ST or T-wave changes.   Problem List  Principal Problem:   Subdural hematoma (HCC) Active Problems:   A-fib (HCC)   Chronic anticoagulation   Assessment: This is a 83 year old Caucasian female with past medical history  as stated earlier, who is chronically anticoagulated for atrial fibrillation, presented after a mechanical fall which has resulted in a subdural hematoma. Patient is currently neurologically intact. She is hemodynamically stable.  Plan: #1 Acute subdural hematoma as a result of a mechanical fall in the setting of chronic anticoagulation: Patient seen by neurosurgery. Oral anticoagulation reversal protocol has been initiated by emergency department. Patient will receive Kcentra. She will have a repeat CT scan at 8:30 PM tonight. She will need to be monitored closely with neuro checks. She also has a scalp laceration which will be sutured or stapled by the ED physician. Patient does not know the status of her tetanus immunization. She'll be given tetanus toxoid.  #2 Chronic Atrial fibrillation: CHADSVASC Score of 3. She is on atenolol at home. She was anticoagulated with  Eliquis which will be held. Heart rate appears to be between 90-110. She'll be monitored on telemetry. We will give her a dose of metoprolol tonight.  #3 history of essential hypertension: Monitor blood pressures closely. Hold her antihypertensive agents for now.  #4 Blood work: Patient needs to be urgently transferred to [facility]. All blood work results are not available yet. CBC results just came back. WBCs noted to be 13.8, hemoglobin is normal. Metabolic panel is pending.   DVT Prophylaxis: SCDs Code Status: Full Code Family Communication: Discussed with the patient and her daughter  Consults called: Neurosurgery: Dr. Wynetta Emery   Severity of Illness: The appropriate patient status for this patient is INPATIENT. Inpatient status is judged to be reasonable and necessary in order to provide the required intensity of service to ensure the patient's safety. The patient's presenting symptoms, physical exam findings, and initial radiographic and laboratory data in the context of their chronic comorbidities is felt to place them at high risk for further clinical deterioration. Furthermore, it is not anticipated that the patient will be medically stable for discharge from the hospital within 2 midnights of admission. The following factors support the patient status of inpatient.   " The patient's presenting symptoms include headache. " The worrisome physical exam findings include tachycardia. " The initial radiographic and laboratory data are worrisome because of acute subdural hematoma. " The chronic co-morbidities include atrial fibrillation.   * I certify that at the point of admission it is my clinical judgment that the patient will require inpatient hospital care spanning beyond 2 midnights from the point of admission due to high intensity of service, high risk for further deterioration and high frequency of surveillance required.*  Further management decisions will depend on results of further  testing and patient's response to treatment.   [facility]  [facility] Pager [phone]  If 7PM-7AM, please contact night-coverage [email or web address] Password [facility]  05/21/2017, 5:52 PM

## 2017-05-21 NOTE — ED Provider Notes (Signed)
[facility]-EMERGENCY DEPT Provider Note   CSN: [id number] Arrival date & time: 05/21/17  1415     History   Chief Complaint Chief Complaint  Patient presents with  . Fall    HPI Laurie Pugh is a 83 y.o. female.chief complaint is fall.  HPI:  This is an 83 year old female with a history of permanent atrial fibrillation on Eliquis.  She tripped over some pain so told him to do walking to her house after church today. She has pain in her left shoulder left elbow and right foot and a mild headache.  Some bleeding from the left scalp.  Past Medical History:  Diagnosis Date  . Allergy   . Angina pectoris, unstable (HCC)   . Atrial fibrillation (HCC)   . Cataract   . Dyslipidemia   . Dysrhythmia   . Esophageal stricture   . GERD (gastroesophageal reflux disease)   . Hyperlipidemia   . Hypertension   . Neck pain   . PAF (paroxysmal atrial fibrillation) (HCC)   . PSVT (paroxysmal supraventricular tachycardia) [facility])     Patient Active Problem List   Diagnosis Date Noted  . Subdural hematoma (HCC) 05/21/2017  . Chronic anticoagulation 05/21/2017  . Esophageal stricture 01/30/2017  . Esophageal dysphagia   . Esophageal obstruction due to food impaction   . Sinus bradycardia 05/26/2014  . PVC's (premature ventricular contractions) 05/26/2014  . Paroxysmal SVT (supraventricular tachycardia) (HCC) 07/16/2013  . A-fib (HCC) 06/24/2013  . Hyperlipidemia 06/24/2013  . Essential hypertension 06/24/2013  . Angina pectoris, unstable (HCC) 06/24/2013  . Neck pain 06/24/2013  . DOE (dyspnea on exertion) 06/24/2013    Past Surgical History:  Procedure Laterality Date  . ABDOMINAL HYSTERECTOMY    . APPENDECTOMY    . BLADDER SURGERY     bladder surgery  . BREAST EXCISIONAL BIOPSY    . CARDIAC CATHETERIZATION  06/28/2004   no significant CAD (Dr. Laurell Josephs)  . CATARACT EXTRACTION, BILATERAL  2008  . CYSTOSCOPY W/ RETROGRADES Bilateral 04/11/2016   Procedure: CYSTOSCOPY WITH  BILATERAL RETROGRADE PYELOGRAM;  Surgeon: Hildred Laser, MD;  Location: [facility];  Service: Urology;  Laterality: Bilateral;  . ESOPHAGOGASTRODUODENOSCOPY N/A 01/30/2017   Procedure: ESOPHAGOGASTRODUODENOSCOPY (EGD);  Surgeon: Hilarie Fredrickson, MD;  Location: [facility];  Service: Endoscopy;  Laterality: N/A;  . ESOPHAGOGASTRODUODENOSCOPY N/A 01/30/2017   Procedure: ESOPHAGOGASTRODUODENOSCOPY (EGD);  Surgeon: Napoleon Form, MD;  Location: [facility];  Service: Endoscopy;  Laterality: N/A;  . TRANSTHORACIC ECHOCARDIOGRAM  2009   borderline conc LVH; trace MR; mild-mod TR, RVSP 30-29mmHg    OB History    No data available       Home Medications    Prior to Admission medications   Medication Sig Start Date End Date Taking? Authorizing Provider  atenolol (TENORMIN) 25 MG tablet TAKE 1 TABLET BY MOUTH  DAILY Patient taking differently: TAKE 25 MG BY MOUTH DAILY 01/11/17   Hilty, Lisette Abu, MD  benazepril (LOTENSIN) 20 MG tablet Take 20 mg by mouth daily.  05/30/13   [provider]  ELIQUIS 5 MG TABS tablet 0.5 mg 2 (two) times daily. 01/11/17   [provider]  gabapentin (NEURONTIN) 100 MG capsule 3 (three) times daily. 03/21/17   [provider]  LORazepam (ATIVAN) 1 MG tablet Take 1 mg by mouth 2 (two) times daily.  05/30/13   [provider]  omeprazole (PRILOSEC) 40 MG capsule Take 1 capsule (40 mg total) by mouth daily. 01/31/17 01/31/18  Edsel Petrin, DO  rosuvastatin (CRESTOR) 10 MG tablet Take 10 mg by mouth daily.    [provider]    Family History Family History  Problem Relation Age of Onset  . Heart disease Mother   . Heart disease Sister   . Suicidality Brother   . Cancer Sister   . COPD Sister   . Suicidality Child   . Colon cancer Neg Hx   . Colon polyps Neg Hx   . Esophageal cancer Neg Hx   . Rectal cancer Neg Hx   . Stomach cancer Neg Hx     Social History Social History  Substance Use Topics  . Smoking  status: Former Smoker    Packs/day: 1.00    Years: 40.00    Quit date: 06/25/2003  . Smokeless tobacco: Never Used  . Alcohol use No     Allergies   Other and Codeine   Review of Systems Review of Systems  Constitutional: Negative for appetite change, chills, diaphoresis, fatigue and fever.  HENT: Negative for mouth sores, sore throat and trouble swallowing.   Eyes: Negative for visual disturbance.  Respiratory: Negative for cough, chest tightness, shortness of breath and wheezing.   Cardiovascular: Negative for chest pain.  Gastrointestinal: Negative for abdominal distention, abdominal pain, diarrhea, nausea and vomiting.  Endocrine: Negative for polydipsia, polyphagia and polyuria.  Genitourinary: Negative for dysuria, frequency and hematuria.  Musculoskeletal: Positive for arthralgias. Negative for gait problem.  Skin: Positive for wound. Negative for color change, pallor and rash.  Neurological: Positive for headaches. Negative for dizziness, syncope and light-headedness.  Hematological: Does not bruise/bleed easily.  Psychiatric/Behavioral: Negative for behavioral problems and confusion.     Physical Exam Updated Vital Signs BP 139/70   Pulse 91   Temp 99.7 F (37.6 C) (Oral)   Resp (!) 21   Ht 5\' 3"  (1.6 m)   Wt 57.5 kg (126 lb 12.8 oz)   SpO2 96%   BMI 22.46 kg/m   Physical Exam  Constitutional: She is oriented to person, place, and time. She appears well-developed and well-nourished. No distress.  Awake and alert. Pleasant cooperative oriented and lucid. As I enter the room she is an x-ray. As I exit her room she is walking back unassisted accompanied by x-ray tach. No limp pain to the foot. Normal use of the left arm and elbow.  HENT:  Head: Normocephalic.  Eyes: Pupils are equal, round, and reactive to light. Conjunctivae are normal. No scleral icterus.  Neck: Normal range of motion. Neck supple. No thyromegaly present.  Cardiovascular: Normal rate and  regular rhythm.  Exam reveals no gallop and no friction rub.   No murmur heard. Pulmonary/Chest: Effort normal and breath sounds normal. No respiratory distress. She has no wheezes. She has no rales.  Abdominal: Soft. Bowel sounds are normal. She exhibits no distension. There is no tenderness. There is no rebound.  Musculoskeletal: Normal range of motion.  Range of motion of shoulder and elbow. She has some tenderness over the dorsum of the foot. She does not have tenderness over the base of the fifth metatarsal in the area of a small bone fragment. Do not believe that this represents acute fracture.  Neurological: She is alert and oriented to person, place, and time.  Awake and alert. Oriented lucid. Normal intact symmetric neurological exam.  Skin: Skin is warm and dry. No rash noted.  Psychiatric: She has a normal mood and affect. Her behavior is normal.     ED Treatments / Results  Labs (all labs ordered are listed, but only abnormal results are displayed) Labs Reviewed  CBC WITH DIFFERENTIAL/PLATELET - Abnormal; Notable for the following:       Result Value   WBC 13.8 (*)    Neutro Abs 11.6 (*)    Monocytes Absolute 1.1 (*)    All other components within normal limits  BASIC METABOLIC PANEL - Abnormal; Notable for the following:    Chloride 100 (*)    Glucose, Bld 123 (*)    All other components within normal limits  HEPARIN LEVEL (UNFRACTIONATED) - Abnormal; Notable for the following:    Heparin Unfractionated 1.74 (*)    All other components within normal limits  URINALYSIS, ROUTINE W REFLEX MICROSCOPIC - Abnormal; Notable for the following:    Protein, ur 30 (*)    Bacteria, UA RARE (*)    Squamous Epithelial / LPF 0-5 (*)    All other components within normal limits  MRSA PCR SCREENING  PROTIME-INR  APTT  COMPREHENSIVE METABOLIC PANEL  CBC  TYPE AND SCREEN  ABO/RH    EKG  EKG Interpretation None       Radiology Dg Elbow Complete Left  Result Date:  05/21/2017 CLINICAL DATA:  83 year old female status post fall outside at home. Left shoulder, elbow and right lateral foot pain. EXAM: LEFT ELBOW - COMPLETE 3+ VIEW COMPARISON:  None. FINDINGS: Bone mineralization is within normal limits for age. Benign exostosis or osteo chondroma from the distal left humerus metadiaphysis directed anteriorly. Degenerative spurring about the coronoid and olecranon of the ulna. No elbow joint effusion identified. The radial head appears intact. No acute fracture or dislocation identified. IMPRESSION: No acute fracture or dislocation identified about the left elbow. Electronically Signed   By: Odessa Fleming M.D.   On: 05/21/2017 15:29   Ct Head Wo Contrast  Result Date: 05/21/2017 CLINICAL DATA:  83 year old female with small subdural hematoma after a fall. EXAM: CT HEAD WITHOUT CONTRAST TECHNIQUE: Contiguous axial images were obtained from the base of the skull through the vertex without intravenous contrast. COMPARISON:  CT head 1616 hours today. FINDINGS: Brain: The small right anterior convexity subdural hematoma has regressed since earlier today and is now only minimally apparent on series 3, image 15. No other acute intracranial hemorrhage. Stable gray-white matter differentiation throughout the brain. No midline shift, mass effect, or evidence of intracranial mass lesion. No ventriculomegaly. No cortically based acute infarct identified. Vascular: Calcified atherosclerosis at the skull base. Skull: Stable.  No skull fracture identified. Sinuses/Orbits: Visualized paranasal sinuses and mastoids are stable and well pneumatized. Other: Large, broad-based left posterior scalp hematoma appears progressed and measures up to 2.2 cm in thickness now. The underlying left parietal bone appears intact. Other orbit and scalp soft tissues appear stable. IMPRESSION: 1. The small right anterior convexity subdural hematoma has regressed since earlier today, with only trace residual. 2.  Progressed superficial left posterior convexity scalp hematoma. No underlying skull fracture. 3. No new intracranial abnormality. Electronically Signed   By: Odessa Fleming M.D.   On: 05/21/2017 22:04   Ct Head Wo Contrast  Result Date: 05/21/2017 CLINICAL DATA:  Larey Seat at home. On Eliquis. Scalp laceration. History of atrial fibrillation, hypertension and hyperlipidemia. EXAM: CT HEAD WITHOUT CONTRAST TECHNIQUE: Contiguous axial images were obtained from the base of the skull through the vertex without intravenous contrast. COMPARISON:  Skull radiographs May 22, 2006 FINDINGS: BRAIN: No intraparenchymal hemorrhage, mass effect nor midline shift. The ventricles and sulci are normal for age. Patchy  supratentorial white matter hypodensities less than expected for patient's age, though non-specific are most compatible with chronic small vessel ischemic disease. Old small RIGHT caudate lacunar infarct. No acute large vascular territory infarcts. 3 mm dense RIGHT frontal subdural hematoma. Basal cisterns are patent. VASCULAR: Mild to moderate calcific atherosclerosis of the carotid siphons. SKULL: No skull fracture. Large LEFT frontoparietal scalp hematoma without subcutaneous gas or radiopaque foreign bodies. SINUSES/ORBITS: Trace LEFT mastoid effusion. The included paranasal sinuses are well-aerated.The included ocular globes and orbital contents are non-suspicious. Status post bilateral ocular lens implants. OTHER: None. IMPRESSION: 1. Acute 3 mm RIGHT frontal subdural hematoma.  No midline shift. 2. Large LEFT scalp hematoma.  No skull fracture. 3. Mild chronic small vessel ischemic disease. Old RIGHT basal ganglia lacunar infarct. 4. Critical Value/emergent results were called by telephone at the time of interpretation on 05/21/2017 at 4:27 pm to Dr. Rolland Porter , who verbally acknowledged these results. Electronically Signed   By: Awilda Metro M.D.   On: 05/21/2017 16:31   Dg Shoulder Left  Result Date:  05/21/2017 CLINICAL DATA:  83 year old female status post fall outside at home. Left shoulder, elbow and right lateral foot pain. EXAM: LEFT SHOULDER - 2+ VIEW COMPARISON:  Chest radiographs 01/29/2017. FINDINGS: No glenohumeral joint dislocation. Intact proximal left humerus. No left clavicle or scapula fracture identified. Calcified aortic atherosclerosis. Visible left ribs and lung parenchyma appear stable. IMPRESSION: No acute fracture or dislocation identified about the left shoulder. Electronically Signed   By: Odessa Fleming M.D.   On: 05/21/2017 15:26   Dg Foot Complete Right  Result Date: 05/21/2017 CLINICAL DATA:  83 year old female status post fall outside at home. Left shoulder, elbow and right lateral foot pain. EXAM: RIGHT FOOT COMPLETE - 3+ VIEW COMPARISON:  None. FINDINGS: Bone mineralization is within normal limits for age. Calcaneus appears intact with mild dorsal degenerative spurring. Tarsal bone alignment and joint spaces within normal limits. Possible tiny avulsion fracture at the base of the fifth metatarsal. No other acute fracture or dislocation identified. Joint spaces are normal for age. IMPRESSION: 1. Possible tiny avulsion fracture at the base of the fifth metatarsal. 2. No other acute osseous abnormality identified. Electronically Signed   By: Odessa Fleming M.D.   On: 05/21/2017 15:27    Procedures Procedures (including critical care time)  Medications Ordered in ED Medications  metoprolol tartrate (LOPRESSOR) tablet 25 mg (25 mg Oral Given 05/21/17 2157)  sodium chloride flush (NS) 0.9 % injection 3 mL (3 mLs Intravenous Not Given 05/21/17 2200)  0.9 %  sodium chloride infusion ( Intravenous New Bag/Given 05/21/17 1926)  acetaminophen (TYLENOL) tablet 650 mg (650 mg Oral Given 05/21/17 2158)    Or  acetaminophen (TYLENOL) suppository 650 mg ( Rectal See Alternative 05/21/17 2158)  oxyCODONE (Oxy IR/ROXICODONE) immediate release tablet 5 mg (not administered)  ondansetron (ZOFRAN)  tablet 4 mg (not administered)    Or  ondansetron (ZOFRAN) injection 4 mg (not administered)  Tdap (BOOSTRIX) injection 0.5 mL (not administered)  lidocaine-EPINEPHrine (XYLOCAINE W/EPI) 2 %-1:200000 (PF) injection 10 mL (10 mLs Infiltration Given by Other 05/21/17 1805)  prothrombin complex conc human (KCENTRA) IVPB 2,677 Units (2,677 Units Intravenous New Bag/Given 05/21/17 1746)     Initial Impression / Assessment and Plan / ED Course  I have reviewed the triage vital signs and the nursing notes.  Pertinent labs & imaging results that were available during my care of the patient were reviewed by me and considered in my medical decision making (  see chart for details).    CT scan shows subdural hematoma along the right frontal convexity. Only a few millimeters. Left occipital hematoma subcutaneous.  I have ordered KCentra. I discussed the case with Dr. Glee Arvin of neurosurgery. He was in agreement. I discussed the case with hospitalist. They're here discussing admission with the patient and planning transfer [facility] ICU at [facility] in intensive care.  CRITICAL CARE Performed by: Rolland Porter JOSEPH   Total critical care time: 30 minutes  Critical care time was exclusive of separately billable procedures and treating other patients.  Critical care was necessary to treat or prevent imminent or life-threatening deterioration.  Critical care was time spent personally by me on the following activities: development of treatment plan with patient and/or surrogate as well as nursing, discussions with consultants, evaluation of patient's response to treatment, examination of patient, obtaining history from patient or surrogate, ordering and performing treatments and interventions, ordering and review of laboratory studies, ordering and review of radiographic studies, pulse oximetry and re-evaluation of patient's condition.   Final Clinical Impressions(s) / ED Diagnoses   Final diagnoses:  None     New Prescriptions Current Discharge Medication List       Rolland Porter, MD 05/21/17 2346

## 2017-05-22 DIAGNOSIS — Z7901 Long term (current) use of anticoagulants: Secondary | ICD-10-CM | POA: Diagnosis not present

## 2017-05-22 DIAGNOSIS — I480 Paroxysmal atrial fibrillation: Secondary | ICD-10-CM | POA: Diagnosis not present

## 2017-05-22 DIAGNOSIS — I6200 Nontraumatic subdural hemorrhage, unspecified: Secondary | ICD-10-CM | POA: Diagnosis not present

## 2017-05-22 LAB — COMPREHENSIVE METABOLIC PANEL
ALK PHOS: 52 U/L (ref 38–126)
ALT: 10 U/L — AB (ref 14–54)
AST: 20 U/L (ref 15–41)
Albumin: 3.4 g/dL — ABNORMAL LOW (ref 3.5–5.0)
Anion gap: 7 (ref 5–15)
BUN: 9 mg/dL (ref 6–20)
CALCIUM: 8.8 mg/dL — AB (ref 8.9–10.3)
CHLORIDE: 105 mmol/L (ref 101–111)
CO2: 28 mmol/L (ref 22–32)
CREATININE: 0.61 mg/dL (ref 0.44–1.00)
Glucose, Bld: 111 mg/dL — ABNORMAL HIGH (ref 65–99)
Potassium: 3.9 mmol/L (ref 3.5–5.1)
Sodium: 140 mmol/L (ref 135–145)
Total Bilirubin: 0.8 mg/dL (ref 0.3–1.2)
Total Protein: 6.2 g/dL — ABNORMAL LOW (ref 6.5–8.1)

## 2017-05-22 LAB — CBC
HCT: 38.5 % (ref 36.0–46.0)
Hemoglobin: 12.4 g/dL (ref 12.0–15.0)
MCH: 29.7 pg (ref 26.0–34.0)
MCHC: 32.2 g/dL (ref 30.0–36.0)
MCV: 92.1 fL (ref 78.0–100.0)
PLATELETS: 159 10*3/uL (ref 150–400)
RBC: 4.18 MIL/uL (ref 3.87–5.11)
RDW: 14.3 % (ref 11.5–15.5)
WBC: 8.6 10*3/uL (ref 4.0–10.5)

## 2017-05-22 LAB — ABO/RH: ABO/RH(D): O POS

## 2017-05-22 MED ORDER — ACETAMINOPHEN 325 MG PO TABS: 650.0000 mg | ORAL_TABLET | Freq: Four times a day (QID) | ORAL | Status: DC | PRN

## 2017-05-22 MED ORDER — PANTOPRAZOLE SODIUM 40 MG PO TBEC: 40.0000 mg | DELAYED_RELEASE_TABLET | Freq: Every day | ORAL | Status: DC

## 2017-05-22 MED ORDER — ROSUVASTATIN CALCIUM 10 MG PO TABS: 10.0000 mg | ORAL_TABLET | Freq: Every day | ORAL | Status: DC

## 2017-05-22 MED ORDER — LORAZEPAM 1 MG PO TABS: 1.0000 mg | ORAL_TABLET | Freq: Two times a day (BID) | ORAL | Status: DC

## 2017-05-22 MED ORDER — BENAZEPRIL HCL 20 MG PO TABS: 20.0000 mg | ORAL_TABLET | Freq: Every day | ORAL | Status: DC

## 2017-05-22 MED ORDER — ATENOLOL 25 MG PO TABS: 25.0000 mg | ORAL_TABLET | Freq: Every day | ORAL | Status: DC

## 2017-05-22 MED ORDER — GABAPENTIN 100 MG PO CAPS: 100.0000 mg | ORAL_CAPSULE | Freq: Three times a day (TID) | ORAL | Status: DC

## 2017-05-22 NOTE — Care Management CC44 (Signed)
Condition Code 44 Documentation Completed  Patient Details  Name: RIYANA FLAMING MRN: [id number] Date of Birth: 08-06-1932   Condition Code 44 given:  Yes Patient signature on Condition Code 44 notice:  Yes Documentation of 2 MD's agreement:  Yes Code 44 added to claim:  Yes    Leone Haven, RN 05/22/2017, 10:51 AM

## 2017-05-22 NOTE — Progress Notes (Signed)
Patient ID: Laurie Pugh, female   DOB: 1932/03/31, 83 y.o.   MRN: [id number] Patient doing well minimal headache awake alert oriented 4 strength 5 out of 5  Repeat CT scan resolving subdural hematoma. From my perspective in a good it's okay for the patient be discharged home today scheduled follow-up with me in 1-2 weeks patient should remain off of her Eliquis for 72 hours I instructed the patient to resume this on Thursday.

## 2017-05-22 NOTE — Discharge Instructions (Signed)
Resume Eliquis on Thursday.  Please take all your medications with you for your next visit with your Primary MD. Please request your Primary MD to go over all hospital test results at the follow up. Please ask your Primary MD to get all Hospital records sent to his/her office.  If you experience worsening of your admission symptoms, develop shortness of breath, chest pain, suicidal or homicidal thoughts or a life threatening emergency, you must seek medical attention immediately by calling 911 or calling your MD.  Laurie Pugh must read the complete instructions/literature along with all the possible adverse reactions/side effects for all the medicines you take including new medications that have been prescribed to you. Take new medicines after you have completely understood and accpet all the possible adverse reactions/side effects.   Do not drive when taking pain medications or sedatives.    Do not take more than prescribed Pain, Sleep and Anxiety Medications  If you have smoked or chewed Tobacco in the last 2 yrs please stop. Stop any regular alcohol and or recreational drug use.  Wear Seat belts while driving.

## 2017-05-22 NOTE — Care Management Note (Signed)
Case Management Note  Patient Details  Name: Laurie Pugh MRN: [id number] Date of Birth: May 06, 1932  Subjective/Objective:   From home alone, pta indep, presenst with fall and SDH, CT scan shows resolving subdural hematoma, she is for discharge today, daughter at bedside.                 Action/Plan: NCM will follow for dc needs.  Expected Discharge Date:  05/22/17               Expected Discharge Plan:  Home/Self Care  In-House Referral:     Discharge planning Services  CM Consult  Post Acute Care Choice:    Choice offered to:     DME Arranged:    DME Agency:     HH Arranged:    HH Agency:     Status of Service:  Completed, signed off  If discussed at [employer] of Stay Meetings, dates discussed:    Additional Comments:  Leone Haven, RN 05/22/2017, 10:52 AM

## 2017-05-22 NOTE — Care Management Obs Status (Signed)
MEDICARE OBSERVATION STATUS NOTIFICATION   Patient Details  Name: Laurie Pugh MRN: [id number] Date of Birth: [birth year]   Medicare Observation Status Notification Given:  Yes    Leone Haven, RN 05/22/2017, 10:51 AM

## 2017-05-22 NOTE — Discharge Summary (Signed)
Physician Discharge Summary  [name] MVH:[id number] DOB: [birth year] DOA: 05/21/2017  PCP: Rodrigo Ran, MD  Admit date: 05/21/2017 Discharge date: 05/22/2017  Admitted From: home  Disposition:  home   Recommendations for Outpatient Follow-up:  1. Resume Eliquis on Thursday  Discharge Condition:  stable   CODE STATUS:  Full code   Consultations:  NS    Discharge Diagnoses:  Principal Problem:   Subdural hematoma (HCC) Active Problems:   A-fib (HCC)   Chronic anticoagulation  HTN   Subjective: No complaints today. Glad to be going home.   Brief Summary: Laurie Pugh is a 83 y.o. female with a past medical history of atrial fibrillation on anticoagulation, hypertension, history of urinary bladder stones who was in her usual state of health when she slipped on a step after she was returning from church and fell backwards on her head. Denies any dizziness, chest pain, shortness of breath, either prior to or after the episode. No loss of consciousness. Denies any weakness on any one side of her body. No nausea, vomiting. No recent fever or chills. No difficulty with urinating. No change in bowel habits recently. She complains of a burning sensation at the site of her injury on her head. She also hurt her left shoulder. She could not get up from the floor and had to be helped up.   In the emergency department CT head was done which revealed a 3 mm right frontal subdural hematoma. Due to the fact that she is on oral anticoagulation with Eliquis as she will need further management.  Hospital Course:   Acute subdural hematoma as a result of a mechanical fall in the setting of chronic anticoagulation  - Oral anticoagulation reversal protocol has been initiated by emergency department. Patient will receive Kcentra.- repeat CT scan at 8:30 PM reveals resolving hematoma - NS has seen the patient this AM and recommends to hold Eliquis for 3 more days and f/u with thme in office  in 2 wks.   Chronic Atrial fibrillation - CHADSVASC Score of 3.  - cont atenolol at home - Eliquis as above  Essential hypertension - cont Atenolol, Benazepril    Discharge Instructions  Discharge Instructions    Diet - low sodium heart healthy    Complete by:  As directed    Increase activity slowly    Complete by:  As directed      Allergies as of 05/22/2017      Reactions   Other Other (See Comments)   Mycins - cannot recall whether azithromycin, e-mycin (REACTION: unknown)   Codeine Nausea Only      Medication List    STOP taking these medications   ELIQUIS 5 MG Tabs tablet Generic drug:  apixaban     TAKE these medications   acetaminophen 325 MG tablet Commonly known as:  TYLENOL Take 2 tablets (650 mg total) by mouth every 6 (six) hours as needed for mild pain (or Fever >/= 101).   atenolol 25 MG tablet Commonly known as:  TENORMIN TAKE 1 TABLET BY MOUTH  DAILY What changed:  See the new instructions.   benazepril 20 MG tablet Commonly known as:  LOTENSIN Take 20 mg by mouth daily.   gabapentin 100 MG capsule Commonly known as:  NEURONTIN 3 (three) times daily.   LORazepam 1 MG tablet Commonly known as:  ATIVAN Take 1 mg by mouth 2 (two) times daily.   omeprazole 40 MG capsule Commonly known as:  PRILOSEC Take 1  capsule (40 mg total) by mouth daily.   rosuvastatin 10 MG tablet Commonly known as:  CRESTOR Take 10 mg by mouth daily.            Discharge Care Instructions        Start     Ordered   05/22/17 0000  acetaminophen (TYLENOL) 325 MG tablet  Every 6 hours PRN     05/22/17 1028   05/22/17 0000  Increase activity slowly     05/22/17 1028   05/22/17 0000  Diet - low sodium heart healthy     05/22/17 1028     Follow-up Information    Donalee Citrin, MD Follow up in 2 day(s).   Specialty:  Neurosurgery Contact information: [address] [place] [address] [phone]          Allergies  Allergen  Reactions  . Other Other (See Comments)    Mycins - cannot recall whether azithromycin, e-mycin (REACTION: unknown)  . Codeine Nausea Only     Procedures/Studies:    Dg Elbow Complete Left  Result Date: 05/21/2017 CLINICAL DATA:  83 year old female status post fall outside at home. Left shoulder, elbow and right lateral foot pain. EXAM: LEFT ELBOW - COMPLETE 3+ VIEW COMPARISON:  None. FINDINGS: Bone mineralization is within normal limits for age. Benign exostosis or osteo chondroma from the distal left humerus metadiaphysis directed anteriorly. Degenerative spurring about the coronoid and olecranon of the ulna. No elbow joint effusion identified. The radial head appears intact. No acute fracture or dislocation identified. IMPRESSION: No acute fracture or dislocation identified about the left elbow. Electronically Signed   By: Odessa Fleming M.D.   On: 05/21/2017 15:29   Ct Head Wo Contrast  Result Date: 05/21/2017 CLINICAL DATA:  83 year old female with small subdural hematoma after a fall. EXAM: CT HEAD WITHOUT CONTRAST TECHNIQUE: Contiguous axial images were obtained from the base of the skull through the vertex without intravenous contrast. COMPARISON:  CT head 1616 hours today. FINDINGS: Brain: The small right anterior convexity subdural hematoma has regressed since earlier today and is now only minimally apparent on series 3, image 15. No other acute intracranial hemorrhage. Stable gray-white matter differentiation throughout the brain. No midline shift, mass effect, or evidence of intracranial mass lesion. No ventriculomegaly. No cortically based acute infarct identified. Vascular: Calcified atherosclerosis at the skull base. Skull: Stable.  No skull fracture identified. Sinuses/Orbits: Visualized paranasal sinuses and mastoids are stable and well pneumatized. Other: Large, broad-based left posterior scalp hematoma appears progressed and measures up to 2.2 cm in thickness now. The underlying left  parietal bone appears intact. Other orbit and scalp soft tissues appear stable. IMPRESSION: 1. The small right anterior convexity subdural hematoma has regressed since earlier today, with only trace residual. 2. Progressed superficial left posterior convexity scalp hematoma. No underlying skull fracture. 3. No new intracranial abnormality. Electronically Signed   By: Odessa Fleming M.D.   On: 05/21/2017 22:04   Ct Head Wo Contrast  Result Date: 05/21/2017 CLINICAL DATA:  Larey Seat at home. On Eliquis. Scalp laceration. History of atrial fibrillation, hypertension and hyperlipidemia. EXAM: CT HEAD WITHOUT CONTRAST TECHNIQUE: Contiguous axial images were obtained from the base of the skull through the vertex without intravenous contrast. COMPARISON:  Skull radiographs May 22, 2006 FINDINGS: BRAIN: No intraparenchymal hemorrhage, mass effect nor midline shift. The ventricles and sulci are normal for age. Patchy supratentorial white matter hypodensities less than expected for patient's age, though non-specific are most compatible with  chronic small vessel ischemic disease. Old small RIGHT caudate lacunar infarct. No acute large vascular territory infarcts. 3 mm dense RIGHT frontal subdural hematoma. Basal cisterns are patent. VASCULAR: Mild to moderate calcific atherosclerosis of the carotid siphons. SKULL: No skull fracture. Large LEFT frontoparietal scalp hematoma without subcutaneous gas or radiopaque foreign bodies. SINUSES/ORBITS: Trace LEFT mastoid effusion. The included paranasal sinuses are well-aerated.The included ocular globes and orbital contents are non-suspicious. Status post bilateral ocular lens implants. OTHER: None. IMPRESSION: 1. Acute 3 mm RIGHT frontal subdural hematoma.  No midline shift. 2. Large LEFT scalp hematoma.  No skull fracture. 3. Mild chronic small vessel ischemic disease. Old RIGHT basal ganglia lacunar infarct. 4. Critical Value/emergent results were called by telephone at the time of  interpretation on 05/21/2017 at 4:27 pm to Dr. Rolland Porter , who verbally acknowledged these results. Electronically Signed   By: Awilda Metro M.D.   On: 05/21/2017 16:31   Dg Shoulder Left  Result Date: 05/21/2017 CLINICAL DATA:  83 year old female status post fall outside at home. Left shoulder, elbow and right lateral foot pain. EXAM: LEFT SHOULDER - 2+ VIEW COMPARISON:  Chest radiographs 01/29/2017. FINDINGS: No glenohumeral joint dislocation. Intact proximal left humerus. No left clavicle or scapula fracture identified. Calcified aortic atherosclerosis. Visible left ribs and lung parenchyma appear stable. IMPRESSION: No acute fracture or dislocation identified about the left shoulder. Electronically Signed   By: Odessa Fleming M.D.   On: 05/21/2017 15:26   Dg Foot Complete Right  Result Date: 05/21/2017 CLINICAL DATA:  83 year old female status post fall outside at home. Left shoulder, elbow and right lateral foot pain. EXAM: RIGHT FOOT COMPLETE - 3+ VIEW COMPARISON:  None. FINDINGS: Bone mineralization is within normal limits for age. Calcaneus appears intact with mild dorsal degenerative spurring. Tarsal bone alignment and joint spaces within normal limits. Possible tiny avulsion fracture at the base of the fifth metatarsal. No other acute fracture or dislocation identified. Joint spaces are normal for age. IMPRESSION: 1. Possible tiny avulsion fracture at the base of the fifth metatarsal. 2. No other acute osseous abnormality identified. Electronically Signed   By: Odessa Fleming M.D.   On: 05/21/2017 15:27        Discharge Exam: Vitals:   05/22/17 0332 05/22/17 0738  BP: 124/73 135/82  Pulse: 71 83  Resp: (!) 24 20  Temp: 98.7 F (37.1 C) 98.5 F (36.9 C)  SpO2: 95% 96%   Vitals:   05/22/17 0000 05/22/17 0200 05/22/17 0332 05/22/17 0738  BP: (!) 109/59 129/67 124/73 135/82  Pulse: 77 74 71 83  Resp: (!) 27 18 (!) 24 20  Temp:   98.7 F (37.1 C) 98.5 F (36.9 C)  TempSrc:   Oral Oral   SpO2: 94% 97% 95% 96%  Weight:      Height:        General: Pt is alert, awake, not in acute distress Cardiovascular: RRR, S1/S2 +, no rubs, no gallops Respiratory: CTA bilaterally, no wheezing, no rhonchi Abdominal: Soft, NT, ND, bowel sounds + Extremities: no edema, no cyanosis    The results of significant diagnostics from this hospitalization (including imaging, microbiology, ancillary and laboratory) are listed below for reference.     Microbiology: Recent Results (from the past 240 hour(s))  MRSA PCR Screening     Status: None   Collection Time: 05/21/17  7:02 PM  Result Value Ref Range Status   MRSA by PCR NEGATIVE NEGATIVE Final    Comment:  The GeneXpert MRSA Assay (FDA approved for NASAL specimens only), is one component of a comprehensive MRSA colonization surveillance program. It is not intended to diagnose MRSA infection nor to guide or monitor treatment for MRSA infections.      Labs: BNP (last 3 results) No results for input(s): BNP in the last 8760 hours. Basic Metabolic Panel:  Recent Labs Lab 05/21/17 1700 05/22/17 0315  NA 138 140  K 3.5 3.9  CL 100* 105  CO2 28 28  GLUCOSE 123* 111*  BUN 11 9  CREATININE 0.65 0.61  CALCIUM 9.2 8.8*   Liver Function Tests:  Recent Labs Lab 05/22/17 0315  AST 20  ALT 10*  ALKPHOS 52  BILITOT 0.8  PROT 6.2*  ALBUMIN 3.4*   No results for input(s): LIPASE, AMYLASE in the last 168 hours. No results for input(s): AMMONIA in the last 168 hours. CBC:  Recent Labs Lab 05/21/17 1700 05/22/17 0315  WBC 13.8* 8.6  NEUTROABS 11.6*  --   HGB 14.1 12.4  HCT 42.4 38.5  MCV 91.8 92.1  PLT 177 159   Cardiac Enzymes: No results for input(s): CKTOTAL, CKMB, CKMBINDEX, TROPONINI in the last 168 hours. BNP: Invalid input(s): POCBNP CBG: No results for input(s): GLUCAP in the last 168 hours. D-Dimer No results for input(s): DDIMER in the last 72 hours. Hgb A1c No results for input(s):  HGBA1C in the last 72 hours. Lipid Profile No results for input(s): CHOL, HDL, LDLCALC, TRIG, CHOLHDL, LDLDIRECT in the last 72 hours. Thyroid function studies No results for input(s): TSH, T4TOTAL, T3FREE, THYROIDAB in the last 72 hours.  Invalid input(s): FREET3 Anemia work up No results for input(s): VITAMINB12, FOLATE, FERRITIN, TIBC, IRON, RETICCTPCT in the last 72 hours. Urinalysis    Component Value Date/Time   COLORURINE YELLOW 05/21/2017 2005   APPEARANCEUR CLEAR 05/21/2017 2005   LABSPEC 1.012 05/21/2017 2005   PHURINE 8.0 05/21/2017 2005   GLUCOSEU NEGATIVE 05/21/2017 2005   HGBUR NEGATIVE 05/21/2017 2005   BILIRUBINUR NEGATIVE 05/21/2017 2005   KETONESUR NEGATIVE 05/21/2017 2005   PROTEINUR 30 (A) 05/21/2017 2005   NITRITE NEGATIVE 05/21/2017 2005   LEUKOCYTESUR NEGATIVE 05/21/2017 2005   Sepsis Labs Invalid input(s): PROCALCITONIN,  WBC,  LACTICIDVEN Microbiology Recent Results (from the past 240 hour(s))  MRSA PCR Screening     Status: None   Collection Time: 05/21/17  7:02 PM  Result Value Ref Range Status   MRSA by PCR NEGATIVE NEGATIVE Final    Comment:        The GeneXpert MRSA Assay (FDA approved for NASAL specimens only), is one component of a comprehensive MRSA colonization surveillance program. It is not intended to diagnose MRSA infection nor to guide or monitor treatment for MRSA infections.      Time coordinating discharge: Over 30 minutes  SIGNED:   Calvert Cantor, MD  [facility] 05/22/2017, 10:28 AM Pager   If 7PM-7AM, please contact night-coverage [email or web address] Password [email or web address]

## 2017-05-22 NOTE — Progress Notes (Signed)
   Introduced chaplaincy services.  Will follow, as needed.  

## 2017-06-16 ENCOUNTER — Other Ambulatory Visit: Payer: Self-pay | Admitting: Student

## 2017-06-16 DIAGNOSIS — S065X9A Traumatic subdural hemorrhage with loss of consciousness of unspecified duration, initial encounter: Secondary | ICD-10-CM

## 2017-06-16 DIAGNOSIS — S065XAA Traumatic subdural hemorrhage with loss of consciousness status unknown, initial encounter: Secondary | ICD-10-CM

## 2017-06-20 ENCOUNTER — Encounter: Payer: Medicare Other | Admitting: Internal Medicine

## 2017-06-21 ENCOUNTER — Ambulatory Visit
Admission: RE | Admit: 2017-06-21 | Discharge: 2017-06-21 | Disposition: A | Discharge status: Home / Self Care | Payer: Medicare Other | Source: Ambulatory Visit | Attending: Student | Admitting: Student

## 2017-06-21 DIAGNOSIS — S065X9A Traumatic subdural hemorrhage with loss of consciousness of unspecified duration, initial encounter: Secondary | ICD-10-CM

## 2017-06-21 DIAGNOSIS — S065XAA Traumatic subdural hemorrhage with loss of consciousness status unknown, initial encounter: Secondary | ICD-10-CM

## 2017-12-07 ENCOUNTER — Other Ambulatory Visit: Payer: Self-pay | Admitting: Internal Medicine

## 2017-12-07 DIAGNOSIS — Z1231 Encounter for screening mammogram for malignant neoplasm of breast: Secondary | ICD-10-CM

## 2017-12-11 ENCOUNTER — Other Ambulatory Visit: Payer: Self-pay | Admitting: Internal Medicine

## 2018-01-08 ENCOUNTER — Ambulatory Visit
Admission: RE | Admit: 2018-01-08 | Discharge: 2018-01-08 | Disposition: A | Discharge status: Home / Self Care | Payer: Medicare Other | Source: Ambulatory Visit | Attending: Internal Medicine | Admitting: Internal Medicine

## 2018-01-08 DIAGNOSIS — Z1231 Encounter for screening mammogram for malignant neoplasm of breast: Secondary | ICD-10-CM

## 2018-02-12 ENCOUNTER — Ambulatory Visit: Payer: Medicare Other | Admitting: Internal Medicine

## 2018-02-12 ENCOUNTER — Encounter: Payer: Self-pay | Admitting: Internal Medicine

## 2018-02-12 VITALS — BP 147/82 | HR 67 | Ht 63.0 in | Wt 131.0 lb

## 2018-02-12 DIAGNOSIS — E782 Mixed hyperlipidemia: Secondary | ICD-10-CM | POA: Diagnosis not present

## 2018-02-12 DIAGNOSIS — I4891 Unspecified atrial fibrillation: Secondary | ICD-10-CM | POA: Diagnosis not present

## 2018-02-12 DIAGNOSIS — I6529 Occlusion and stenosis of unspecified carotid artery: Secondary | ICD-10-CM

## 2018-02-12 DIAGNOSIS — I10 Essential (primary) hypertension: Secondary | ICD-10-CM

## 2018-02-12 NOTE — Progress Notes (Signed)
OFFICE NOTE  Chief Complaint:  No complaints  Primary Care Physician: Rodrigo Ran, MD  HPI:  Laurie Pugh is a pleasant 84 year old female with a history of smoking for 40 years, one pack per day. She quit in 2005 when she underwent heart catheterization. This demonstrated no significant coronary disease, despite significant risk factors. Her family history is significant for mother who had an MI and died at age 22 and her sister who had bypass surgery and died at age 82. Just as a history of atrial fibrillation and this is been ongoing for years. She says she is having symptoms paroxysmal 8 in about 2 weeks ago she noted her heart flipping and flopping out of rhythm. This episode was associated with neck pain which is new diaphoresis and some discomfort as well as shortness of breath. She is concerned about the new symptoms as they may be coronary equivalents. She is on medication for hypertension and dyslipidemia as well as warfarin which he takes for atrial fibrillation.   Based on her symptoms I recommended a LexiScan nuclear stress test. She did undergo this stress test on 07/02/2013. This was negative except for a small area of breast attenuation artifact. She was also placed on a CardioNet monitor which she wore between 06/24/2013 07/08/2013. There was 164 hours a total monitoring time. During that episode there were no rhythms that were self triggered. However the device did not medically trigger for heart rates in the 30s and 40s mostly at night with PVCs. Interestingly she had an episode on 07/04/2013 were she went into a abrupt onset narrow complex supraventricular tachycardia. This persisted for approximately 13 minutes and it was captured to abruptly terminate. She reports she was unaware of this episode. No atrial fibrillation was noted.  Because of her continuing palpitations, I recommended starting beta blocker at her last visit.  Due to cost we switched her to atenolol 25  mg daily, and she noted after taking the first pill that she no longer had any further palpitations. She reports that she has suffered with palpitations for over 10 years and is so pleased that they've actually stopped.  It is noted however that her heart rate is much lower today in the 40's.  She occasionally gets some positional dizziness.  Laurie Pugh called the office the other day and reported that she felt weak and that she may be out of rhythm, in A. Fib. I recommended that she made an appointment so we can get an EKG to see if she is truly out of rhythm. When I went into the room today asked her she felt that she was out of rhythm and she said he has however her EKG did demonstrate sinus rhythm. I think it is a problem and the fact that she is aware of palpitations however these are oftentimes PACs or PVCs but not necessarily A. fib. We have not been able to demonstrate that on monitoring. We have also decreased her dose of atenolol, potentially exposing her to more arrhythmias.  I had the pleasure see Mr. back in the office today. Overall she is doing well denies any chest pain or worsening shortness of breath. She still has intermittent episodes of A. fib, in fact had an episode when she was recently at her primary care doctor's office. Her INR was not well-regulated on warfarin and therefore she was switched to Eliquis which she seems to be tolerating well. She currently has a UTI and is on Cipro.  Apparently she has some degree of a cystocele. She is not positive in a candidate for bladder surgery. I asked her whether or not she was using a pessary, which may be an option for her. She can discuss this further with her primary care doctor.  11/14/2016  Laurie Pugh returns today for follow-up. She's done well over the last year. She remains in A. fib at a rate of 74, but this is thought to be paroxysmal. She takes a look was without any bleeding problems. She is asymptomatic with regards to her A. fib.  She denies any chest pain or worsening shortness of breath.  02/12/2018  Laurie Pugh is seen today in follow-up.  Overall she is without complaints.  I asked her how she was feeling and she said well.  I asked her if she knew that she had any recurrent A. fib and she denied it.  Today however her EKG shows coarse A. fib or atrial flutter with variable AV block at 67.  I reviewed labs which date back to May 2018.  Her total cholesterol then was 123, HDL 34, LDL 71 and triglycerides 88.  Hemoglobin A1c was 6.  Blood pressure is up a little today 147/82.  PMHx:  Past Medical History:  Diagnosis Date  . Allergy   . Angina pectoris, unstable (HCC)   . Atrial fibrillation (HCC)   . Cataract   . Dyslipidemia   . Dysrhythmia   . Esophageal stricture   . GERD (gastroesophageal reflux disease)   . Hyperlipidemia   . Hypertension   . Neck pain   . PAF (paroxysmal atrial fibrillation) (HCC)   . PSVT (paroxysmal supraventricular tachycardia) (HCC)     Past Surgical History:  Procedure Laterality Date  . ABDOMINAL HYSTERECTOMY    . APPENDECTOMY    . BLADDER SURGERY     bladder surgery  . BREAST EXCISIONAL BIOPSY    . CARDIAC CATHETERIZATION  06/28/2004   no significant CAD (Dr. Laurell Josephs)  . CATARACT EXTRACTION, BILATERAL  2008  . CYSTOSCOPY W/ RETROGRADES Bilateral 04/11/2016   Procedure: CYSTOSCOPY WITH BILATERAL RETROGRADE PYELOGRAM;  Surgeon: Hildred Laser, MD;  Location: [facility];  Service: Urology;  Laterality: Bilateral;  . ESOPHAGOGASTRODUODENOSCOPY N/A 01/30/2017   Procedure: ESOPHAGOGASTRODUODENOSCOPY (EGD);  Surgeon: Hilarie Fredrickson, MD;  Location: [facility];  Service: Endoscopy;  Laterality: N/A;  . ESOPHAGOGASTRODUODENOSCOPY N/A 01/30/2017   Procedure: ESOPHAGOGASTRODUODENOSCOPY (EGD);  Surgeon: Napoleon Form, MD;  Location: [facility];  Service: Endoscopy;  Laterality: N/A;  . TRANSTHORACIC ECHOCARDIOGRAM  2009   borderline conc LVH; trace MR; mild-mod TR, RVSP 30-39mmHg     FAMHx:  Family History  Problem Relation Age of Onset  . Heart disease Mother   . Heart disease Sister   . Suicidality Brother   . Cancer Sister   . COPD Sister   . Suicidality Child   . Colon cancer Neg Hx   . Colon polyps Neg Hx   . Esophageal cancer Neg Hx   . Rectal cancer Neg Hx   . Stomach cancer Neg Hx     SOCHx:   reports that she quit smoking about 14 years ago. She has a 40.00 pack-year smoking history. She has never used smokeless tobacco. She reports that she does not drink alcohol or use drugs.  ALLERGIES:  Allergies  Allergen Reactions  . Other Other (See Comments)    Mycins - cannot recall whether azithromycin, e-mycin (REACTION: unknown)  . Codeine Nausea Only  ROS: Pertinent items noted in HPI and remainder of comprehensive ROS otherwise negative.  HOME MEDS: Current Outpatient Medications  Medication Sig Dispense Refill  . acetaminophen (TYLENOL) 325 MG tablet Take 2 tablets (650 mg total) by mouth every 6 (six) hours as needed for mild pain (or Fever >/= 101).    Marland Kitchen atenolol (TENORMIN) 25 MG tablet TAKE 1 TABLET BY MOUTH  DAILY 90 tablet 0  . benazepril (LOTENSIN) 20 MG tablet Take 20 mg by mouth daily.     . chlorthalidone (HYGROTEN) 100 MG tablet Take 100 mg by mouth as needed.    . gabapentin (NEURONTIN) 100 MG capsule 3 (three) times daily.    Marland Kitchen LORazepam (ATIVAN) 1 MG tablet Take 1 mg by mouth 2 (two) times daily.     . rosuvastatin (CRESTOR) 10 MG tablet Take 10 mg by mouth daily.    Marland Kitchen omeprazole (PRILOSEC) 40 MG capsule Take 1 capsule (40 mg total) by mouth daily. 30 capsule 0   No current facility-administered medications for this visit.     LABS/IMAGING: No results found for this or any previous visit (from the past 48 hour(s)). No results found.  VITALS: BP (!) 147/82   Pulse 67   Ht  (1.6 m)   Wt 131 lb (59.4 kg)   BMI 23.21 kg/m   EXAM: General appearance: alert and no distress Neck: no carotid bruit, no JVD and  thyroid not enlarged, symmetric, no tenderness/mass/nodules Lungs: clear to auscultation bilaterally Heart: irregularly irregular rhythm Abdomen: soft, non-tender; bowel sounds normal; no masses,  no organomegaly Extremities: extremities normal, atraumatic, no cyanosis or edema Pulses: 2+ and symmetric Skin: Skin color, texture, turgor normal. No rashes or lesions Neurologic: Grossly normal Psych: Pleasant  EKG: Atrial fibrillation or flutter with variable AV block at 67-personally reviewed  ASSESSMENT: 1. Paroxysmal atrial fibrillation - on Eliquis, CHADSVASC Score of 3 2. Hypertension - generally well controlled 3. Dyslipidemia - on statin 4. Significant past smoking history - quit in 2005 5. Family history of coronary disease  PLAN: 1.   Laurie Pugh denies any awareness of her A. fib or a flutter.  She remains on Eliquis 2.5 mg twice daily (dose adjusted for weight and age).  Blood pressure generally well controlled.  She is on statin therapy with good control of her cholesterol.  She denies any chest pain or shortness of breath.  Overall she is doing well.  We will continue her current medications plan to see her back annually or sooner as necessary.  Chrystie Nose, MD, [facility], FACP  [place]  [facility]  Medical Director of the [facility]  [facility] Diplomate of the American Board of Clinical Lipidology Attending Cardiologist  Direct Dial: [phone]  Fax: [phone]  Website:  [email or web address] Nicely Hilty 02/12/2018, 9:17 AM

## 2018-02-12 NOTE — Patient Instructions (Signed)
Your physician wants you to follow-up in: ONE YEAR with Dr. Hilty. You will receive a reminder letter in the mail two months in advance. If you don't receive a letter, please call our office to schedule the follow-up appointment.  

## 2018-03-09 ENCOUNTER — Other Ambulatory Visit: Payer: Self-pay | Admitting: Internal Medicine

## 2018-03-09 NOTE — Telephone Encounter (Signed)
Rx request sent to pharmacy.  

## 2018-05-29 ENCOUNTER — Other Ambulatory Visit: Payer: Self-pay | Admitting: Internal Medicine

## 2018-11-22 ENCOUNTER — Encounter (HOSPITAL_COMMUNITY): Payer: Medicare Other

## 2018-11-26 ENCOUNTER — Encounter (HOSPITAL_COMMUNITY): Payer: Medicare Other

## 2018-12-03 ENCOUNTER — Encounter (HOSPITAL_COMMUNITY): Payer: Medicare Other

## 2018-12-04 ENCOUNTER — Other Ambulatory Visit: Payer: Self-pay | Admitting: Internal Medicine

## 2018-12-04 DIAGNOSIS — Z1231 Encounter for screening mammogram for malignant neoplasm of breast: Secondary | ICD-10-CM

## 2018-12-07 ENCOUNTER — Other Ambulatory Visit (HOSPITAL_COMMUNITY): Payer: Self-pay | Admitting: *Deleted

## 2018-12-10 ENCOUNTER — Other Ambulatory Visit: Payer: Self-pay

## 2018-12-10 ENCOUNTER — Ambulatory Visit (HOSPITAL_COMMUNITY)
Admission: RE | Admit: 2018-12-10 | Discharge: 2018-12-10 | Disposition: A | Discharge status: Home / Self Care | Payer: Medicare Other | Source: Ambulatory Visit | Attending: Internal Medicine | Admitting: Internal Medicine

## 2018-12-10 DIAGNOSIS — M810 Age-related osteoporosis without current pathological fracture: Secondary | ICD-10-CM | POA: Insufficient documentation

## 2018-12-10 MED ORDER — DENOSUMAB 60 MG/ML ~~LOC~~ SOSY
PREFILLED_SYRINGE | SUBCUTANEOUS | Status: AC
  Filled 2018-12-10: qty 1

## 2018-12-10 MED ORDER — DENOSUMAB 60 MG/ML ~~LOC~~ SOSY
60.0000 mg | PREFILLED_SYRINGE | Freq: Once | SUBCUTANEOUS | Status: AC
  Administered 2018-12-10: 09:00:00 60 mg via SUBCUTANEOUS

## 2018-12-10 NOTE — Discharge Instructions (Signed)
Denosumab injection °What is this medicine? °DENOSUMAB (den oh sue mab) slows bone breakdown. Prolia is used to treat osteoporosis in women after menopause and in men, and in people who are taking corticosteroids for 6 months or more. Xgeva is used to treat a high calcium level due to cancer and to prevent bone fractures and other bone problems caused by multiple myeloma or cancer bone metastases. Xgeva is also used to treat giant cell tumor of the bone. °This medicine may be used for other purposes; ask your health care provider or pharmacist if you have questions. °COMMON BRAND NAME(S): Prolia, XGEVA °What should I tell my health care provider before I take this medicine? °They need to know if you have any of these conditions: °-dental disease °-having surgery or tooth extraction °-infection °-kidney disease °-low levels of calcium or Vitamin D in the blood °-malnutrition °-on hemodialysis °-skin conditions or sensitivity °-thyroid or parathyroid disease °-an unusual reaction to denosumab, other medicines, foods, dyes, or preservatives °-pregnant or trying to get pregnant °-breast-feeding °How should I use this medicine? °This medicine is for injection under the skin. It is given by a health care professional in a hospital or clinic setting. °A special MedGuide will be given to you before each treatment. Be sure to read this information carefully each time. °For Prolia, talk to your pediatrician regarding the use of this medicine in children. Special care may be needed. For Xgeva, talk to your pediatrician regarding the use of this medicine in children. While this drug may be prescribed for children as young as 13 years for selected conditions, precautions do apply. °Overdosage: If you think you have taken too much of this medicine contact a poison control center or emergency room at once. °NOTE: This medicine is only for you. Do not share this medicine with others. °What if I miss a dose? °It is important not to  miss your dose. Call your doctor or health care professional if you are unable to keep an appointment. °What may interact with this medicine? °Do not take this medicine with any of the following medications: °-other medicines containing denosumab °This medicine may also interact with the following medications: °-medicines that lower your chance of fighting infection °-steroid medicines like prednisone or cortisone °This list may not describe all possible interactions. Give your health care provider a list of all the medicines, herbs, non-prescription drugs, or dietary supplements you use. Also tell them if you smoke, drink alcohol, or use illegal drugs. Some items may interact with your medicine. °What should I watch for while using this medicine? °Visit your doctor or health care professional for regular checks on your progress. Your doctor or health care professional may order blood tests and other tests to see how you are doing. °Call your doctor or health care professional for advice if you get a fever, chills or sore throat, or other symptoms of a cold or flu. Do not treat yourself. This drug may decrease your body's ability to fight infection. Try to avoid being around people who are sick. °You should make sure you get enough calcium and vitamin D while you are taking this medicine, unless your doctor tells you not to. Discuss the foods you eat and the vitamins you take with your health care professional. °See your dentist regularly. Brush and floss your teeth as directed. Before you have any dental work done, tell your dentist you are receiving this medicine. °Do not become pregnant while taking this medicine or for 5 months   after stopping it. Talk with your doctor or health care professional about your birth control options while taking this medicine. Women should inform their doctor if they wish to become pregnant or think they might be pregnant. There is a potential for serious side effects to an unborn  child. Talk to your health care professional or pharmacist for more information. °What side effects may I notice from receiving this medicine? °Side effects that you should report to your doctor or health care professional as soon as possible: °-allergic reactions like skin rash, itching or hives, swelling of the face, lips, or tongue °-bone pain °-breathing problems °-dizziness °-jaw pain, especially after dental work °-redness, blistering, peeling of the skin °-signs and symptoms of infection like fever or chills; cough; sore throat; pain or trouble passing urine °-signs of low calcium like fast heartbeat, muscle cramps or muscle pain; pain, tingling, numbness in the hands or feet; seizures °-unusual bleeding or bruising °-unusually weak or tired °Side effects that usually do not require medical attention (report to your doctor or health care professional if they continue or are bothersome): °-constipation °-diarrhea °-headache °-joint pain °-loss of appetite °-muscle pain °-runny nose °-tiredness °-upset stomach °This list may not describe all possible side effects. Call your doctor for medical advice about side effects. You may report side effects to FDA at 1-800-FDA-1088. °Where should I keep my medicine? °This medicine is only given in a clinic, doctor's office, or other health care setting and will not be stored at home. °NOTE: This sheet is a summary. It may not cover all possible information. If you have questions about this medicine, talk to your doctor, pharmacist, or health care provider. °© 2019 Elsevier/Gold Standard (2018-01-19 16:10:44) ° °

## 2019-01-14 ENCOUNTER — Ambulatory Visit: Payer: Medicare Other

## 2019-02-05 ENCOUNTER — Other Ambulatory Visit: Payer: Self-pay | Admitting: Internal Medicine

## 2019-02-05 ENCOUNTER — Ambulatory Visit
Admission: RE | Admit: 2019-02-05 | Discharge: 2019-02-05 | Disposition: A | Discharge status: Home / Self Care | Payer: Medicare Other | Source: Ambulatory Visit | Attending: Internal Medicine | Admitting: Internal Medicine

## 2019-02-05 DIAGNOSIS — G8929 Other chronic pain: Secondary | ICD-10-CM

## 2019-02-07 ENCOUNTER — Telehealth: Payer: Self-pay | Admitting: Internal Medicine

## 2019-02-07 NOTE — Telephone Encounter (Signed)
Patient wants tele visit instead of video. Call home phone.

## 2019-02-08 ENCOUNTER — Other Ambulatory Visit: Payer: Self-pay | Admitting: Internal Medicine

## 2019-02-08 DIAGNOSIS — S32009A Unspecified fracture of unspecified lumbar vertebra, initial encounter for closed fracture: Secondary | ICD-10-CM

## 2019-02-11 ENCOUNTER — Telehealth (INDEPENDENT_AMBULATORY_CARE_PROVIDER_SITE_OTHER): Payer: Medicare Other | Admitting: Internal Medicine

## 2019-02-11 ENCOUNTER — Encounter: Payer: Self-pay | Admitting: Internal Medicine

## 2019-02-11 VITALS — BP 102/77 | HR 81 | Ht 63.0 in | Wt 130.0 lb

## 2019-02-11 DIAGNOSIS — Z7189 Other specified counseling: Secondary | ICD-10-CM

## 2019-02-11 DIAGNOSIS — I10 Essential (primary) hypertension: Secondary | ICD-10-CM | POA: Diagnosis not present

## 2019-02-11 DIAGNOSIS — I6529 Occlusion and stenosis of unspecified carotid artery: Secondary | ICD-10-CM

## 2019-02-11 DIAGNOSIS — I6523 Occlusion and stenosis of bilateral carotid arteries: Secondary | ICD-10-CM

## 2019-02-11 DIAGNOSIS — Z7901 Long term (current) use of anticoagulants: Secondary | ICD-10-CM | POA: Diagnosis not present

## 2019-02-11 DIAGNOSIS — E782 Mixed hyperlipidemia: Secondary | ICD-10-CM

## 2019-02-11 DIAGNOSIS — I4891 Unspecified atrial fibrillation: Secondary | ICD-10-CM

## 2019-02-11 NOTE — Patient Instructions (Signed)
Medication Instructions:  Your physician recommends that you continue on your current medications as directed. Please refer to the Current Medication list given to you today.  If you need a refill on your cardiac medications before your next appointment, please call your pharmacy.   Lab work: NONE needed If you have labs (blood work) drawn today and your tests are completely normal, you will receive your results only by: Marland Kitchen MyChart Message (if you have MyChart) OR . A paper copy in the mail If you have any lab test that is abnormal or we need to change your treatment, we will call you to review the results.  Testing/Procedures: NONE needed  Follow-Up: At [facility], you and your health needs are our priority.  As part of our continuing mission to provide you with exceptional heart care, we have created designated Provider Care Teams.  These Care Teams include your primary Cardiologist (physician) and Advanced Practice Providers (APPs -  Physician Assistants and Nurse Practitioners) who all work together to provide you with the care you need, when you need it. You will need a follow up appointment in 12 months.  Please call our office 2 months in advance to schedule this appointment.  You may see Dr. Rennis Golden or one of the following Advanced Practice Providers on your designated Care Team: Azalee Course, [place] . Micah Flesher, PA-C  Any Other Special Instructions Will Be Listed Below (If Applicable).

## 2019-02-11 NOTE — Progress Notes (Signed)
Virtual Visit via Video Note   This visit type was conducted due to national recommendations for restrictions regarding the COVID-19 Pandemic (e.g. social distancing) in an effort to limit this patient's exposure and mitigate transmission in our community.  Due to her co-morbid illnesses, this patient is at least at moderate risk for complications without adequate follow up.  This format is felt to be most appropriate for this patient at this time.  All issues noted in this document were discussed and addressed.  A limited physical exam was performed with this format.  Please refer to the patient's chart for her consent to telehealth for [facility].   Evaluation Performed:  Telephone visit  Date:  02/11/2019   ID:  Laurie Laurahristine D Pugh, DOB [birth year], MRN [id number]  Patient Location:  [address] [place] [address]  Provider location:   [address], [address] [place], [place] [address]  PCP:  Rodrigo RanPerini, Mark, MD  Cardiologist:  Chrystie NoseKenneth C Lional Icenogle, MD Electrophysiologist:  None   Chief Complaint:  Back pain  History of Present Illness:    Laurie Pugh is a 85 y.o. female who presents via audio/video conferencing for a telehealth visit today.  Laurie Pugh is seen today in follow-up.  This is a telephone visit.  She reports being in some pain that she had a fall more than a month ago.  She had x-rays of her back which showed a collapsed vertebrae.  Is not clear whether that led to her fall as it is more likely a spontaneous finding rather than a traumatic finding.  Nonetheless she will need an MRI of her back to determine whether or not she may need stabilization of possible kyphoplasty.  She denies any symptomatic atrial fibrillation.  She remains on Eliquis 2.5 mg twice daily-actually she takes a half of 5 mg tablet twice a day.  This is more cost effective for her.  Her cholesterol seems well controlled her LDL was in the 60s.  She is on rosuvastatin.  He does have  bilateral carotid artery disease which is mild and has been stable.  The patient does not have symptoms concerning for COVID-19 infection (fever, chills, cough, or new SHORTNESS OF BREATH).    Prior CV studies:   The following studies were reviewed today:  Chart review  PMHx:  Past Medical History:  Diagnosis Date  . Allergy   . Angina pectoris, unstable (HCC)   . Atrial fibrillation (HCC)   . Cataract   . Dyslipidemia   . Dysrhythmia   . Esophageal stricture   . GERD (gastroesophageal reflux disease)   . Hyperlipidemia   . Hypertension   . Neck pain   . PAF (paroxysmal atrial fibrillation) (HCC)   . PSVT (paroxysmal supraventricular tachycardia) (HCC)     Past Surgical History:  Procedure Laterality Date  . ABDOMINAL HYSTERECTOMY    . APPENDECTOMY    . BLADDER SURGERY     bladder surgery  . BREAST EXCISIONAL BIOPSY    . CARDIAC CATHETERIZATION  06/28/2004   no significant CAD (Dr. Laurell Josephs. McQueen)  . CATARACT EXTRACTION, BILATERAL  2008  . CYSTOSCOPY W/ RETROGRADES Bilateral 04/11/2016   Procedure: CYSTOSCOPY WITH BILATERAL RETROGRADE PYELOGRAM;  Surgeon: Hildred LaserBrian James Budzyn, MD;  Location: [facility];  Service: Urology;  Laterality: Bilateral;  . ESOPHAGOGASTRODUODENOSCOPY N/A 01/30/2017   Procedure: ESOPHAGOGASTRODUODENOSCOPY (EGD);  Surgeon: Hilarie FredricksonPerry, John N, MD;  [facility];  Service: Endoscopy;  Laterality: N/A;  . ESOPHAGOGASTRODUODENOSCOPY N/A  01/30/2017   Procedure: ESOPHAGOGASTRODUODENOSCOPY (EGD);  Surgeon: Napoleon Form, MD;  [facility];  Service: Endoscopy;  Laterality: N/A;  . TRANSTHORACIC ECHOCARDIOGRAM  2009   borderline conc LVH; trace MR; mild-mod TR, RVSP 30-3mmHg    FAMHx:  Family History  Problem Relation Age of Onset  . Heart disease Mother   . Heart disease Sister   . Suicidality Brother   . Cancer Sister   . COPD Sister   . Suicidality Child   . Colon cancer Neg Hx   . Colon polyps Neg Hx   . Esophageal cancer Neg Hx   .  Rectal cancer Neg Hx   . Stomach cancer Neg Hx     SOCHx:   reports that she quit smoking about 15 years ago. She has a 40.00 pack-year smoking history. She has never used smokeless tobacco. She reports that she does not drink alcohol or use drugs.  ALLERGIES:  Allergies  Allergen Reactions  . Other Other (See Comments)    Mycins - cannot recall whether azithromycin, e-mycin (REACTION: unknown)  . Codeine Nausea Only    MEDS:  Current Meds  Medication Sig  . apixaban (ELIQUIS) 5 MG TABS tablet Take 2.5 mg by mouth 2 (two) times daily.  Marland Kitchen atenolol (TENORMIN) 25 MG tablet TAKE 1 TABLET BY MOUTH  DAILY  . benazepril (LOTENSIN) 20 MG tablet Take 20 mg by mouth daily.   . chlorthalidone (HYGROTEN) 100 MG tablet Take 100 mg by mouth as needed.  . gabapentin (NEURONTIN) 100 MG capsule Take 100 mg by mouth 3 (three) times daily.   Marland Kitchen LORazepam (ATIVAN) 1 MG tablet Take 1 mg by mouth 2 (two) times daily.   Marland Kitchen omeprazole (PRILOSEC) 40 MG capsule Take 1 capsule (40 mg total) by mouth daily.  Marland Kitchen oxyCODONE-acetaminophen (PERCOCET/ROXICET) 5-325 MG tablet Take 1-2 tablets by mouth every 6 (six) hours as needed. for pain  . rosuvastatin (CRESTOR) 10 MG tablet Take 10 mg by mouth daily.  . [DISCONTINUED] apixaban (ELIQUIS) 2.5 MG TABS tablet Take 2.5 mg by mouth 2 (two) times daily.     ROS: Pertinent items noted in HPI and remainder of comprehensive ROS otherwise negative.  Labs/Other Tests and Data Reviewed:    Recent Labs: No results found for requested labs within last 8760 hours.   Recent Lipid Panel Lab Results  Component Value Date/Time   CHOL 123 (L) 11/19/2015 08:05 AM   TRIG 144 11/19/2015 08:05 AM   HDL 42 (L) 11/19/2015 08:05 AM   CHOLHDL 2.9 11/19/2015 08:05 AM   LDLCALC 52 11/19/2015 08:05 AM    Wt Readings from Last 3 Encounters:  02/11/19 130 lb (59 kg)  02/12/18 131 lb (59.4 kg)  05/21/17 126 lb 12.8 oz (57.5 kg)     Exam:    Vital Signs:  BP 102/77   Pulse 81    Ht  (1.6 m)   Wt 130 lb (59 kg)   BMI 23.03 kg/m    No exam due to telephone visit  ASSESSMENT & PLAN:    1. Paroxysmal atrial fibrillation - on Eliquis, CHADSVASC Score of 3 2. Hypertension - generally well controlled 3. Dyslipidemia - on statin 4. Significant past smoking history - quit in 2005 5. Family history of coronary disease 6. Mild bilateral carotid artery disease  Laurie Pugh is unaware of A. fib.  She is doing well on reduced dose Eliquis adjusted for age and weight less than 60 kg.  Her blood pressures  well controlled today.  She is a goal LDL less than 70 on rosuvastatin.  She does have some mild bilateral carotid artery disease.  No changes were made to her medications today.  Follow-up annually or sooner as necessary.  COVID-19 Education: The signs and symptoms of COVID-19 were discussed with the patient and how to seek care for testing (follow up with PCP or arrange E-visit).  The importance of social distancing was discussed today.  Patient Risk:   After full review of this patients clinical status, I feel that they are at least moderate risk at this time.  Time:   Today, I have spent 25 minutes with the patient with telehealth technology discussing hypertension, dyslipidemia, PAF, anticoagulation and carotid artery disease as well as recent fall and vertebral compression fracture.     Medication Adjustments/Labs and Tests Ordered: Current medicines are reviewed at length with the patient today.  Concerns regarding medicines are outlined above.   Tests Ordered: No orders of the defined types were placed in this encounter.   Medication Changes: No orders of the defined types were placed in this encounter.   Disposition:  in 1 year(s)  Chrystie Nose, MD, [facility], FACP  [place]  [facility]  Medical Director of the Advanced Lipid Disorders &  Cardiovascular Risk Reduction Clinic Diplomate of the American Board of Clinical Lipidology Attending  Cardiologist  Direct Dial: [phone]  Fax: [phone]  Website:  [email or web address]  Chrystie Nose, MD  02/11/2019 8:26 AM

## 2019-02-11 NOTE — Telephone Encounter (Signed)
VERBAL CONSENT GIVEN TO Laurie Pugh ON 02/07/2019 AT 12:08PM.     Virtual Visit Pre-Appointment Phone Call  "Laurie Pugh, I am calling you today to discuss your upcoming appointment. We are currently trying to limit exposure to the virus that causes COVID-19 by seeing patients at home rather than in the office."  1. "What is the BEST phone number to call the day of the visit?" - include this in appointment notes  2. "Do you have or have access to (through a family member/friend) a smartphone with video capability that we can use for your visit?" a. If yes - list this number in appt notes as "cell" (if different from BEST phone #) and list the appointment type as a VIDEO visit in appointment notes b. If no - list the appointment type as a PHONE visit in appointment notes  3. Confirm consent - "In the setting of the current Covid19 crisis, you are scheduled for a (phone or video) visit with your provider on 02/11/2019 at 8am.  Just as we do with many in-office visits, in order for you to participate in this visit, we must obtain consent.  If you'd like, I can send this to your mychart (if signed up) or email for you to review.  Otherwise, I can obtain your verbal consent now.  All virtual visits are billed to your insurance company just like a normal visit would be.  By agreeing to a virtual visit, we'd like you to understand that the technology does not allow for your provider to perform an examination, and thus may limit your provider's ability to fully assess your condition. If your provider identifies any concerns that need to be evaluated in person, we will make arrangements to do so.  Finally, though the technology is pretty good, we cannot assure that it will always work on either your or our end, and in the setting of a video visit, we may have to convert it to a phone-only visit.  In either situation, we cannot ensure that we have a secure connection.  Are you willing to proceed?" STAFF: Did the  patient verbally acknowledge consent to telehealth visit? Document YES/NO here: YES  4. Advise patient to be prepared - "Two hours prior to your appointment, go ahead and check your blood pressure, pulse, oxygen saturation, and your weight (if you have the equipment to check those) and write them all down. When your visit starts, your provider will ask you for this information. If you have an Apple Watch or Kardia device, please plan to have heart rate information ready on the day of your appointment. Please have a pen and paper handy nearby the day of the visit as well."  5. Give patient instructions for MyChart download to smartphone OR Doximity/Doxy.me as below if video visit (depending on what platform provider is using)  6. Inform patient they will receive a phone call 15 minutes prior to their appointment time (may be from unknown caller ID) so they should be prepared to answer    TELEPHONE CALL NOTE  Laurie Pugh has been deemed a candidate for a follow-up tele-health visit to limit community exposure during the Covid-19 pandemic. I spoke with the patient via phone to ensure availability of phone/video source, confirm preferred email & phone number, and discuss instructions and expectations.  I reminded Laurie Pugh to be prepared with any vital sign and/or heart rhythm information that could potentially be obtained via home monitoring, at the time of her  visit. I reminded Laurie Pugh to expect a phone call prior to her visit.  Laurie Pugh, CMA 02/11/2019 7:44 AM   INSTRUCTIONS FOR DOWNLOADING THE MYCHART APP TO SMARTPHONE  - The patient must first make sure to have activated MyChart and know their login information - If Apple, go to [employer] and type in MyChart in the search bar and download the app. If Android, ask patient to go to [employer] and type in [place] in the search bar and download the app. The app is free but as with any other app downloads,  their phone may require them to verify saved payment information or Apple/Android password.  - The patient will need to then log into the app with their MyChart username and password, and select [facility] as their healthcare provider to link the account. When it is time for your visit, go to the MyChart app, find appointments, and click Begin Video Visit. Be sure to Select Allow for your device to access the Microphone and Camera for your visit. You will then be connected, and your provider will be with you shortly.  **If they have any issues connecting, or need assistance please contact MyChart service desk [phone])**  **If using a computer, in order to ensure the best quality for their visit they will need to use either of the following Internet Browsers: [employer], or Google Chrome**  IF USING DOXIMITY or DOXY.ME - The patient will receive a link just prior to their visit by text.     FULL LENGTH CONSENT FOR TELE-HEALTH VISIT   I hereby voluntarily request, consent and authorize [facility] and its employed or contracted physicians, physician assistants, nurse practitioners or other licensed health care professionals (the Practitioner), to provide me with telemedicine health care services (the "Services") as deemed necessary by the treating Practitioner. I acknowledge and consent to receive the Services by the Practitioner via telemedicine. I understand that the telemedicine visit will involve communicating with the Practitioner through live audiovisual communication technology and the disclosure of certain medical information by electronic transmission. I acknowledge that I have been given the opportunity to request an in-person assessment or other available alternative prior to the telemedicine visit and am voluntarily participating in the telemedicine visit.  I understand that I have the right to withhold or withdraw my consent to the use of telemedicine in the  course of my care at any time, without affecting my right to future care or treatment, and that the Practitioner or I may terminate the telemedicine visit at any time. I understand that I have the right to inspect all information obtained and/or recorded in the course of the telemedicine visit and may receive copies of available information for a reasonable fee.  I understand that some of the potential risks of receiving the Services via telemedicine include:  Marland Kitchen Delay or interruption in medical evaluation due to technological equipment failure or disruption; . Information transmitted may not be sufficient (e.g. poor resolution of images) to allow for appropriate medical decision making by the Practitioner; and/or  . In rare instances, security protocols could fail, causing a breach of personal health information.  Furthermore, I acknowledge that it is my responsibility to provide information about my medical history, conditions and care that is complete and accurate to the best of my ability. I acknowledge that Practitioner's advice, recommendations, and/or decision may be based on factors not within their control, such as incomplete or inaccurate data provided by me  or distortions of diagnostic images or specimens that may result from electronic transmissions. I understand that the practice of medicine is not an exact science and that Practitioner makes no warranties or guarantees regarding treatment outcomes. I acknowledge that I will receive a copy of this consent concurrently upon execution via email to the email address I last provided but may also request a printed copy by calling the office of [facility].    I understand that my insurance will be billed for this visit.   I have read or had this consent read to me. . I understand the contents of this consent, which adequately explains the benefits and risks of the Services being provided via telemedicine.  . I have been provided ample  opportunity to ask questions regarding this consent and the Services and have had my questions answered to my satisfaction. . I give my informed consent for the services to be provided through the use of telemedicine in my medical care  By participating in this telemedicine visit I agree to the above.

## 2019-02-13 ENCOUNTER — Other Ambulatory Visit: Payer: Self-pay

## 2019-02-13 ENCOUNTER — Ambulatory Visit
Admission: RE | Admit: 2019-02-13 | Discharge: 2019-02-13 | Disposition: A | Discharge status: Home / Self Care | Payer: Medicare Other | Source: Ambulatory Visit | Attending: Internal Medicine | Admitting: Internal Medicine

## 2019-02-13 DIAGNOSIS — S32009A Unspecified fracture of unspecified lumbar vertebra, initial encounter for closed fracture: Secondary | ICD-10-CM

## 2019-02-26 ENCOUNTER — Telehealth: Payer: Self-pay

## 2019-02-26 NOTE — Telephone Encounter (Signed)
   [facility] Pre-operative Risk Assessment    Request for surgical clearance:  1. What type of surgery is being performed? L1 Kyphoplasty   2. When is this surgery scheduled?  03-18-2019   3. What type of clearance is required (medical clearance vs. Pharmacy clearance to hold med vs. Both)? Medical, Pharmacy  4. Are there any medications that need to be held prior to surgery and how long? Eliquis, how many days to hold  5. Practice name and name of physician performing surgery?  [facility]   6. What is your office phone number [phone]    7.   What is your office fax number [phone]  8.   Anesthesia type (None, local, MAC, general) ? General    Jacqulynn Cadet 02/26/2019, 4:15 PM  _________________________________________________________________   (provider comments below)

## 2019-02-27 NOTE — Telephone Encounter (Signed)
   Primary Cardiologist: Chrystie Nose, MD  Chart reviewed as part of pre-operative protocol coverage. Laurie Pugh was recently seen in the office on 02/11/2019 by Dr. Rennis Golden and doing well. Given past medical history and time since last visit, based on ACC/AHA guidelines, Laurie Pugh would be at acceptable risk for the planned procedure without further cardiovascular testing.   According to our pharmacist: Pt takes Eliquis for afib with CHADS2VASc score of 5 (age x2, sex, HTN, CAD). Renal function is normal. Recommend holding Eliquis for 3 days prior to spinal procedure.  I will route this recommendation to the requesting party via Epic fax function and remove from pre-op pool.  Please call with questions.  Berton Bon, NP 02/27/2019, 2:54 PM

## 2019-02-27 NOTE — Telephone Encounter (Signed)
Pt takes Eliquis for afib with CHADS2VASc score of 5 (age x2, sex, HTN, CAD). Renal function is normal. Recommend holding Eliquis for 3 days prior to spinal procedure.

## 2019-03-04 ENCOUNTER — Ambulatory Visit
Admission: RE | Admit: 2019-03-04 | Discharge: 2019-03-04 | Disposition: A | Discharge status: Home / Self Care | Payer: Medicare Other | Source: Ambulatory Visit | Attending: Internal Medicine | Admitting: Internal Medicine

## 2019-03-04 ENCOUNTER — Other Ambulatory Visit: Payer: Self-pay

## 2019-03-04 DIAGNOSIS — Z1231 Encounter for screening mammogram for malignant neoplasm of breast: Secondary | ICD-10-CM

## 2019-03-11 ENCOUNTER — Other Ambulatory Visit (HOSPITAL_COMMUNITY): Payer: Self-pay | Admitting: Interventional Radiology

## 2019-03-11 DIAGNOSIS — S32010A Wedge compression fracture of first lumbar vertebra, initial encounter for closed fracture: Secondary | ICD-10-CM

## 2019-03-13 ENCOUNTER — Other Ambulatory Visit: Payer: Self-pay | Admitting: Radiology

## 2019-03-15 ENCOUNTER — Encounter (HOSPITAL_COMMUNITY): Payer: Self-pay

## 2019-03-15 ENCOUNTER — Other Ambulatory Visit: Payer: Self-pay

## 2019-03-15 ENCOUNTER — Ambulatory Visit (HOSPITAL_COMMUNITY)
Admission: RE | Admit: 2019-03-15 | Discharge: 2019-03-15 | Disposition: A | Discharge status: Home / Self Care | Payer: Medicare Other | Source: Ambulatory Visit | Attending: Interventional Radiology | Admitting: Interventional Radiology

## 2019-03-15 DIAGNOSIS — Z7901 Long term (current) use of anticoagulants: Secondary | ICD-10-CM | POA: Insufficient documentation

## 2019-03-15 DIAGNOSIS — Z8249 Family history of ischemic heart disease and other diseases of the circulatory system: Secondary | ICD-10-CM | POA: Insufficient documentation

## 2019-03-15 DIAGNOSIS — Z885 Allergy status to narcotic agent status: Secondary | ICD-10-CM | POA: Diagnosis not present

## 2019-03-15 DIAGNOSIS — E785 Hyperlipidemia, unspecified: Secondary | ICD-10-CM | POA: Insufficient documentation

## 2019-03-15 DIAGNOSIS — I471 Supraventricular tachycardia: Secondary | ICD-10-CM | POA: Insufficient documentation

## 2019-03-15 DIAGNOSIS — Z79899 Other long term (current) drug therapy: Secondary | ICD-10-CM | POA: Insufficient documentation

## 2019-03-15 DIAGNOSIS — Z90710 Acquired absence of both cervix and uterus: Secondary | ICD-10-CM | POA: Diagnosis not present

## 2019-03-15 DIAGNOSIS — K219 Gastro-esophageal reflux disease without esophagitis: Secondary | ICD-10-CM | POA: Insufficient documentation

## 2019-03-15 DIAGNOSIS — X58XXXA Exposure to other specified factors, initial encounter: Secondary | ICD-10-CM | POA: Diagnosis not present

## 2019-03-15 DIAGNOSIS — Z87891 Personal history of nicotine dependence: Secondary | ICD-10-CM | POA: Diagnosis not present

## 2019-03-15 DIAGNOSIS — Z881 Allergy status to other antibiotic agents status: Secondary | ICD-10-CM | POA: Diagnosis not present

## 2019-03-15 DIAGNOSIS — I480 Paroxysmal atrial fibrillation: Secondary | ICD-10-CM | POA: Diagnosis not present

## 2019-03-15 DIAGNOSIS — S32010A Wedge compression fracture of first lumbar vertebra, initial encounter for closed fracture: Secondary | ICD-10-CM | POA: Diagnosis not present

## 2019-03-15 DIAGNOSIS — I10 Essential (primary) hypertension: Secondary | ICD-10-CM | POA: Diagnosis not present

## 2019-03-15 HISTORY — PX: IR VERTEBROPLASTY LUMBAR BX INC UNI/BIL INC/INJECT/IMAGING: IMG5516

## 2019-03-15 LAB — BASIC METABOLIC PANEL
Anion gap: 11 (ref 5–15)
BUN: 17 mg/dL (ref 8–23)
CO2: 28 mmol/L (ref 22–32)
Calcium: 9.7 mg/dL (ref 8.9–10.3)
Chloride: 102 mmol/L (ref 98–111)
Creatinine, Ser: 0.7 mg/dL (ref 0.44–1.00)
GFR calc Af Amer: >60 mL/min (ref >60)
GFR calc non Af Amer: >60 mL/min (ref >60)
Glucose, Bld: 126 mg/dL — ABNORMAL HIGH (ref 70–99)
Potassium: 3.5 mmol/L (ref 3.5–5.1)
Sodium: 141 mmol/L (ref 135–145)

## 2019-03-15 LAB — URINALYSIS, COMPLETE (UACMP) WITH MICROSCOPIC
Bacteria, UA: NONE SEEN
Bilirubin Urine: NEGATIVE
Glucose, UA: NEGATIVE mg/dL
Hgb urine dipstick: NEGATIVE
Ketones, ur: NEGATIVE mg/dL
Leukocytes,Ua: NEGATIVE
Nitrite: NEGATIVE
Protein, ur: NEGATIVE mg/dL
Specific Gravity, Urine: 1.01 (ref 1.005–1.030)
pH: 7 (ref 5.0–8.0)

## 2019-03-15 LAB — CBC
HCT: 47.7 % — ABNORMAL HIGH (ref 36.0–46.0)
Hemoglobin: 15.4 g/dL — ABNORMAL HIGH (ref 12.0–15.0)
MCH: 31.2 pg (ref 26.0–34.0)
MCHC: 32.3 g/dL (ref 30.0–36.0)
MCV: 96.8 fL (ref 80.0–100.0)
Platelets: 208 10*3/uL (ref 150–400)
RBC: 4.93 MIL/uL (ref 3.87–5.11)
RDW: 14.3 % (ref 11.5–15.5)
WBC: 8.1 10*3/uL (ref 4.0–10.5)
nRBC: 0 % (ref 0.0–0.2)

## 2019-03-15 LAB — PROTIME-INR
INR: 1 (ref 0.8–1.2)
Prothrombin Time: 13.4 seconds (ref 11.4–15.2)

## 2019-03-15 MED ORDER — LIDOCAINE HCL (PF) 1 % IJ SOLN
INTRAMUSCULAR | Status: AC | PRN
  Administered 2019-03-15: 10 mL

## 2019-03-15 MED ORDER — SODIUM CHLORIDE 0.9 % IV SOLN: INTRAVENOUS | Status: DC

## 2019-03-15 MED ORDER — HYDROMORPHONE HCL 1 MG/ML IJ SOLN
INTRAMUSCULAR | Status: AC
  Filled 2019-03-15: qty 1

## 2019-03-15 MED ORDER — FENTANYL CITRATE (PF) 100 MCG/2ML IJ SOLN
INTRAMUSCULAR | Status: AC
  Filled 2019-03-15: qty 4

## 2019-03-15 MED ORDER — CEFAZOLIN SODIUM-DEXTROSE 2-4 GM/100ML-% IV SOLN
2.0000 g | INTRAVENOUS | Status: AC
  Administered 2019-03-15: 2 g via INTRAVENOUS

## 2019-03-15 MED ORDER — MIDAZOLAM HCL 2 MG/2ML IJ SOLN
INTRAMUSCULAR | Status: AC
  Filled 2019-03-15: qty 4

## 2019-03-15 MED ORDER — TOBRAMYCIN SULFATE 1.2 G IJ SOLR
INTRAMUSCULAR | Status: AC
  Filled 2019-03-15: qty 1.2

## 2019-03-15 MED ORDER — MIDAZOLAM HCL 2 MG/2ML IJ SOLN
INTRAMUSCULAR | Status: AC
  Filled 2019-03-15: qty 2

## 2019-03-15 MED ORDER — CEFAZOLIN SODIUM-DEXTROSE 2-4 GM/100ML-% IV SOLN
INTRAVENOUS | Status: AC
  Filled 2019-03-15: qty 100

## 2019-03-15 MED ORDER — IOHEXOL 300 MG/ML  SOLN
50.0000 mL | Freq: Once | INTRAMUSCULAR | Status: AC | PRN
  Administered 2019-03-15: 10:00:00 10 mL via INTRAVENOUS

## 2019-03-15 MED ORDER — FENTANYL CITRATE (PF) 100 MCG/2ML IJ SOLN
INTRAMUSCULAR | Status: AC | PRN
  Administered 2019-03-15 (×2): 25 ug via INTRAVENOUS

## 2019-03-15 MED ORDER — BUPIVACAINE HCL 0.25 % IJ SOLN
INTRAMUSCULAR | Status: AC | PRN
  Administered 2019-03-15: 10 mL

## 2019-03-15 MED ORDER — BUPIVACAINE HCL (PF) 0.5 % IJ SOLN
INTRAMUSCULAR | Status: AC
  Filled 2019-03-15: qty 30

## 2019-03-15 MED ORDER — MIDAZOLAM HCL 2 MG/2ML IJ SOLN
INTRAMUSCULAR | Status: AC | PRN
  Administered 2019-03-15 (×2): 1 mg via INTRAVENOUS

## 2019-03-15 MED ORDER — OXYCODONE-ACETAMINOPHEN 5-325 MG PO TABS
1.0000 | ORAL_TABLET | Freq: Once | ORAL | Status: AC
  Administered 2019-03-15: 1 via ORAL
  Filled 2019-03-15: qty 1

## 2019-03-15 NOTE — Procedures (Signed)
S/P L1 VP 

## 2019-03-15 NOTE — H&P (Signed)
Chief Complaint: Patient was seen in consultation today for L1 compression fracture/kyphoplasty.  Referring Physician(s): Rodrigo RanPerini, Mark  Supervising Physician: Julieanne Cottoneveshwar, Sanjeev  [name]  History of Present Illness: Laurie Pugh is a 85 y.o. female with a past medical history of hypertension, hyperlipidemia, paroxysmal atrial fibrillation on chronic anticoagulation with Eliquis, paroxysmal SVT, GERD, esophageal stricture, and cataracts. In early April 2020, patient fell and developed continuous back pain. She saw her PCP, Dr. Waynard EdwardsPerini, for further management who ordered appropriate imaging scans for evaluation.  DG lumbar spine 02/05/2019: 1. Compression fracture of the L1 vertebral body. Approximately 50% loss of vertebral body height. This fracture is new since 2017 and could be acute. 2. Chronic mild compression deformity at L3. 3. Stable disc space disease and narrowing at L4-L5.  MR lumbar spine 02/13/2019: 1. Subacute L1 compression fracture with severe loss of height (75%), marrow edema, and retropulsion of bone resulting in mild spinal stenosis at the level of the conus medullaris. No conus mass effect, signal abnormality, or other complicating features. 2. Underlying normal bone marrow signal and age concordant lumbar spine degeneration.  IR requested by Dr. Waynard EdwardsPerini for possible image-guided L1 kyphoplasty/vertebroplasty. Patient awake and alert laying in bed. Complains of midline low back pain, rated 9.5/10 at this time. Complains of abdominal pain "when my back hurts". Denies fever, chills, chest pain, dyspnea, or headache.  Patient is currently taking Eliquis 2.5 mg twice daily- reports last dose Monday 03/11/2019.   Past Medical History:  Diagnosis Date   Allergy    Angina pectoris, unstable (HCC)    Atrial fibrillation (HCC)    Cataract    Dyslipidemia    Dysrhythmia    Esophageal stricture    GERD (gastroesophageal reflux  disease)    Hyperlipidemia    Hypertension    Neck pain    PAF (paroxysmal atrial fibrillation) (HCC)    PSVT (paroxysmal supraventricular tachycardia) (HCC)     Past Surgical History:  Procedure Laterality Date   ABDOMINAL HYSTERECTOMY     APPENDECTOMY     BLADDER SURGERY     bladder surgery   BREAST EXCISIONAL BIOPSY     CARDIAC CATHETERIZATION  06/28/2004   no significant CAD (Dr. Laurell Josephs. McQueen)   CATARACT EXTRACTION, BILATERAL  2008   CYSTOSCOPY W/ RETROGRADES Bilateral 04/11/2016   Procedure: CYSTOSCOPY WITH BILATERAL RETROGRADE PYELOGRAM;  Surgeon: Hildred LaserBrian James Budzyn, MD;  Location: [facility];  Service: Urology;  Laterality: Bilateral;   ESOPHAGOGASTRODUODENOSCOPY N/A 01/30/2017   Procedure: ESOPHAGOGASTRODUODENOSCOPY (EGD);  Surgeon: Hilarie FredricksonPerry, John N, MD;  [facility];  Service: Endoscopy;  Laterality: N/A;   ESOPHAGOGASTRODUODENOSCOPY N/A 01/30/2017   Procedure: ESOPHAGOGASTRODUODENOSCOPY (EGD);  Surgeon: Napoleon FormNandigam, Kavitha V, MD;  [facility];  Service: Endoscopy;  Laterality: N/A;   TRANSTHORACIC ECHOCARDIOGRAM  2009   borderline conc LVH; trace MR; mild-mod TR, RVSP 30-3340mmHg    Allergies: Erythromycin and Codeine  Medications: Prior to Admission medications   Medication Sig Start Date End Date Taking? Authorizing Provider  atenolol (TENORMIN) 25 MG tablet TAKE 1 TABLET BY MOUTH  DAILY Patient taking differently: Take 25 mg by mouth daily.  05/30/18  Yes Hilty, Lisette AbuKenneth C, MD  benazepril (LOTENSIN) 20 MG tablet Take 20 mg by mouth daily.  05/30/13  Yes [provider]  chlorthalidone (HYGROTON) 25 MG tablet Take 12.5-25 mg by mouth daily as needed (for SBP >140).   Yes [provider]  gabapentin (NEURONTIN) 100 MG capsule Take 100 mg by mouth 3 (  three) times daily as needed (pain).  03/21/17  Yes [provider]  LORazepam (ATIVAN) 1 MG tablet Take 0.5-1 mg by mouth 2 (two) times daily as needed for anxiety.  05/30/13  Yes  [provider]  oxyCODONE-acetaminophen (PERCOCET/ROXICET) 5-325 MG tablet Take 1 tablet by mouth every 6 (six) hours.  02/06/19  Yes [provider]  rosuvastatin (CRESTOR) 10 MG tablet Take 10 mg by mouth at bedtime.    Yes [provider]  TYMLOS 3120 MCG/1.56ML SOPN Inject 80 mcg into the skin daily. 02/28/19  Yes [provider]  apixaban (ELIQUIS) 5 MG TABS tablet Take 2.5 mg by mouth 2 (two) times daily.    [provider]  omeprazole (PRILOSEC) 40 MG capsule Take 1 capsule (40 mg total) by mouth daily. 01/31/17 02/11/19  Cristal Ford, DO     Family History  Problem Relation Age of Onset   Heart disease Mother    Heart disease Sister    Suicidality Brother    Cancer Sister    COPD Sister    Suicidality Child    Colon cancer Neg Hx    Colon polyps Neg Hx    Esophageal cancer Neg Hx    Rectal cancer Neg Hx    Stomach cancer Neg Hx     Social History   Socioeconomic History   Marital status: Widowed    Spouse name: Not on file   Number of children: 4   Years of education: Not on file   Highest education level: Not on file  Occupational History   Occupation: retired  Scientist, product/process development strain: Not on file   Food insecurity    Worry: Not on file    Inability: Not on Lexicographer needs    Medical: Not on file    Non-medical: Not on file  Tobacco Use   Smoking status: Former Smoker    Packs/day: 1.00    Years: 40.00    Pack years: 40.00    Quit date: 06/25/2003    Years since quitting: 15.7   Smokeless tobacco: Never Used  Substance and Sexual Activity   Alcohol use: No   Drug use: No   Sexual activity: Not on file  Lifestyle   Physical activity    Days per week: Not on file    Minutes per session: Not on file   Stress: Not on file  Relationships   Social connections    Talks on phone: Not on file    Gets together: Not on file    Attends religious service: Not on  file    Active member of club or organization: Not on file    Attends meetings of clubs or organizations: Not on file    Relationship status: Not on file  Other Topics Concern   Not on file  Social History Narrative   Not on file     Review of Systems: A 12 point ROS discussed and pertinent positives are indicated in the HPI above.  All other systems are negative.  Review of Systems  Constitutional: Negative for chills and fever.  Respiratory: Negative for shortness of breath and wheezing.   Cardiovascular: Negative for chest pain and palpitations.  Gastrointestinal: Positive for abdominal pain.  Musculoskeletal: Positive for back pain.  Neurological: Negative for headaches.  Psychiatric/Behavioral: Negative for behavioral problems and confusion.    Vital Signs: BP (!) 143/76    Pulse 92    Temp 97.9 F (36.6  C) (Oral)    Resp 16    Ht 5\' 3"  (1.6 m)    Wt 124 lb (56.2 kg)    SpO2 94%    BMI 21.97 kg/m   Physical Exam Vitals signs and nursing note reviewed.  Constitutional:      General: She is not in acute distress.    Appearance: Normal appearance.  Cardiovascular:     Comments: Irregular rate, irregular rhythm. Pulmonary:     Effort: Pulmonary effort is normal. No respiratory distress.     Breath sounds: Normal breath sounds. No wheezing.  Musculoskeletal:     Comments: Moderate midline back pain at approximate level of L1.  Skin:    General: Skin is warm and dry.  Neurological:     Mental Status: She is alert and oriented to person, place, and time.  Psychiatric:        Mood and Affect: Mood normal.        Behavior: Behavior normal.        Thought Content: Thought content normal.        Judgment: Judgment normal.      MD Evaluation Airway: WNL Heart: WNL Abdomen: WNL Chest/ Lungs: WNL ASA  Classification: 3 Mallampati/Airway Score: Two   Imaging: Mr Lumbar Spine Wo Contrast  Result Date: 02/13/2019 CLINICAL DATA:  85 year old female with L1  vertebral body compression on radiographs earlier this month, fall about 1 month ago with continued back pain. EXAM: MRI LUMBAR SPINE WITHOUT CONTRAST TECHNIQUE: Multiplanar, multisequence MR imaging of the lumbar spine was performed. No intravenous contrast was administered. COMPARISON:  Lumbar radiographs 02/05/2019. FINDINGS: Segmentation:  Normal on the comparison. Alignment: Stable vertebral height and alignment from the recent radiographs. There is subtle anterolisthesis of L2 on L3 and L5 on S1. Vertebrae: Marrow edema throughout the compressed L1 vertebral body with loss of height up to 75%. Retropulsion of the posterosuperior endplate resulting in mild spinal stenosis but no mass effect on the adjacent conus (series 14, image 5). Marrow edema tracks into both pedicles, but otherwise the posterior elements appear intact. Background bone marrow signal appears normal. No other marrow edema or evidence of acute osseous abnormality. Intact visible sacrum and SI joints. Conus medullaris and cauda equina: Conus extends to the L2 level. No lower spinal cord or conus signal abnormality. Paraspinal and other soft tissues: Negative for age visible abdominal viscera; benign left renal cysts are partially visible. There is mild paraspinal soft tissue inflammation about the L1 vertebra, and involving the medial psoas muscles. Disc levels: Disc degeneration throughout the lumbar spine, but capacious lumbar spinal canal. Mild spinal stenosis at both T12-L1 and L1-L2 in part related to the L1 retropulsion. No conus mass effect or signal abnormality. Borderline to mild degenerative lumbar spinal stenosis at L2-L3. IMPRESSION: 1. Subacute L1 compression fracture with severe loss of height (75%), marrow edema, and retropulsion of bone resulting in mild spinal stenosis at the level of the conus medullaris. No conus mass effect, signal abnormality, or other complicating features. 2. Underlying normal bone marrow signal and age  concordant lumbar spine degeneration. Electronically Signed   By: Odessa FlemingH  Hall M.D.   On: 02/13/2019 21:55   Mm 3d Screen Breast Bilateral  Result Date: 03/04/2019 CLINICAL DATA:  Screening. EXAM: DIGITAL SCREENING BILATERAL MAMMOGRAM WITH TOMO AND CAD COMPARISON:  Previous exam(s). ACR Breast Density Category b: There are scattered areas of fibroglandular density. FINDINGS: There are no findings suspicious for malignancy. Images were processed with CAD. IMPRESSION: No mammographic  evidence of malignancy. A result letter of this screening mammogram will be mailed directly to the patient. RECOMMENDATION: Screening mammogram in one year. (Code:SM-B-01Y) BI-RADS CATEGORY  1: Negative. Electronically Signed   By: Baird Lyonsina  Arceo M.D.   On: 03/04/2019 10:33    Labs:  CBC: Recent Labs    03/15/19 0704  WBC 8.1  HGB 15.4*  HCT 47.7*  PLT 208    COAGS: Recent Labs    03/15/19 0704  INR 1.0    BMP: Recent Labs    03/15/19 0704  NA 141  K 3.5  CL 102  CO2 28  GLUCOSE 126*  BUN 17  CALCIUM 9.7  CREATININE 0.70  GFRNONAA >60  GFRAA >60     Assessment and Plan:  L1 compression fracture. Plan for image-guided L1 kyphoplasty/vertebroplasty today with Dr. Corliss Skainseveshwar. Patient is NPO. Afebrile and WBCs WNL. Last dose Eliquis Monday 03/11/2019- ok to proceed per IR protocol. INR 1.0 today. UA pending.  Risks and benefits of L1 kyphoplasty/vertebroplasty were discussed with the patient including, but not limited to education regarding the natural healing process of compression fractures without intervention, bleeding, infection, cement migration which may cause spinal cord damage, paralysis, pulmonary embolism or even death. This interventional procedure involves the use of X-rays and because of the nature of the planned procedure, it is possible that we will have prolonged use of X-ray fluoroscopy. Potential radiation risks to you include (but are not limited to) the following: - A slightly  elevated risk for cancer  several years later in life. This risk is typically less than 0.5% percent. This risk is low in comparison to the normal incidence of human cancer, which is 33% for women and 50% for men according to the American Cancer Society. - Radiation induced injury can include skin redness, resembling a rash, tissue breakdown / ulcers and hair loss (which can be temporary or permanent).  The likelihood of either of these occurring depends on the difficulty of the procedure and whether you are sensitive to radiation due to previous procedures, disease, or genetic conditions.  IF your procedure requires a prolonged use of radiation, you will be notified and given written instructions for further action.  It is your responsibility to monitor the irradiated area for the 2 weeks following the procedure and to notify your physician if you are concerned that you have suffered a radiation induced injury.   All of the patient's questions were answered, patient is agreeable to proceed. Consent signed and in chart.   Thank you for this interesting consult.  I greatly enjoyed meeting Laurie Pugh and look forward to participating in their care.  A copy of this report was sent to the requesting provider on this date.  Electronically Signed: Elwin MochaAlexandra Natarsha Hurwitz, PA-C 03/15/2019, 7:47 AM   I spent a total of 30 Minutes in face to face in clinical consultation, greater than 50% of which was counseling/coordinating care for L1 compression fracture/kyphoplasty.

## 2019-03-15 NOTE — Discharge Instructions (Signed)
1.No stooping,bending or lifting more than 10 lbs for 2 weeks. 2.Use walker to ambulate for 2 weeks. 3.No driving for 2 weeks. 4.RTC PRN 2 to 3 weeks  Percutaneous Vertebroplasty, Care After This sheet gives you information about how to care for yourself after your procedure. Your doctor may also give you more specific instructions. If you have problems or questions, contact your doctor. Follow these instructions at home: Surgical cut (incision) care  Follow instructions from your doctor about how to take care of your cut from surgery. Make sure you: ? Wash your hands with soap and water before you change your bandage (dressing). If you cannot use soap and water, use hand sanitizer. ? Change your bandage as told by your doctor. ? Leave stitches (sutures), skin glue, or skin tape (adhesive) strips in place. They may need to stay in place for 2 weeks or longer. If tape strips get loose and curl up, you may trim the loose edges. Do not remove tape strips completely unless your doctor says it is okay.  Check your surgical cut area every day for signs of infection. Check for: ? Redness, swelling, or pain. ? Fluid or blood. ? Warmth. ? Pus or a bad smell.  Keep the bandage dry as told by your doctor. Do not shower or bathe until your doctor says it is okay. Managing pain, stiffness, and swelling   If directed, apply ice to the affected area: ? Put ice in a plastic bag. ? Place a towel between your skin and bag. ? Leave the ice on for 20 minutes, 2-3 times a day.  Rest for 24 hrs after the procedure or as told by your doctor. Activity  Slowly return to normal activities as told by your doctor.  Ask what type of stretching and strengthening exercises you should do.  Do not bend or lift anything greater than 10 lb (4.5 kg). Follow your doctor's instructions about bending and lifting. General instructions  Take over-the-counter and prescription medicines only as told by your  doctor.  Do not drive for 24 hours if you were given a medicine to help you relax (sedative).  To prevent or treat constipation while you are taking prescription pain medicine, your doctor may recommend that you: ? Drink enough fluid to keep your pee (urine) clear or pale yellow. ? Take over-the-counter or prescription medicines. ? Eat foods that are high in fiber, such as:  Fresh fruits.  Fresh vegetables.  Whole grains.  Beans. ? Limit foods that are high in fat and processed sugars, such as fried and sweet foods. ? Keep all follow-up visits as told by your doctor. This is important. Contact a doctor if:  You have redness, swelling, or pain around your cut.  You have fluid or blood coming from your cut.  Your cut feels warm to the touch.  You have pus or bad smell coming from your cut.  You have a fever.  You are sick to your stomach (nauseous) or throw up (vomit) for more than 24 hours.  Your back pain does not get better. Get help right away if:  You have very bad back pain that comes on all of a sudden.  You cannot control when you pee or poop (bowel movement).  You lose feeling (become numb) or have tingling in your legs or feet, or they become weak.  You have new tingling, numbness, or weakness in your legs or feet.  You have sudden weakness in your legs.  You  have pain that shoots down your legs.  You have chest pain.  You have trouble breathing.  You are short of breath.  You feel dizzy or you pass out (faint).  Your vision changes or you cannot talk as you normally do. Summary  Rest for 24 hrs after the procedure and return slowly to normal activities as told by your doctor.  Do not drive for 24 hours if you were given a medicine to help you relax (sedative).  Take over-the-counter and prescription medicines only as told by your doctor. This information is not intended to replace advice given to you by your health care provider. Make sure you  discuss any questions you have with your health care provider. Document Released: 12/07/2009 Document Revised: 12/13/2016 Document Reviewed: 12/13/2016 Elsevier Interactive Patient Education  2019 [employer].

## 2019-03-18 ENCOUNTER — Encounter (HOSPITAL_COMMUNITY): Payer: Self-pay | Admitting: Interventional Radiology

## 2019-05-16 ENCOUNTER — Other Ambulatory Visit: Payer: Self-pay | Admitting: Internal Medicine

## 2019-06-07 ENCOUNTER — Encounter: Payer: Self-pay | Admitting: Physician Assistant

## 2019-06-18 ENCOUNTER — Other Ambulatory Visit: Payer: Self-pay | Admitting: Internal Medicine

## 2019-06-18 DIAGNOSIS — R634 Abnormal weight loss: Secondary | ICD-10-CM

## 2019-06-18 DIAGNOSIS — R109 Unspecified abdominal pain: Secondary | ICD-10-CM

## 2019-06-24 ENCOUNTER — Other Ambulatory Visit: Payer: Self-pay | Admitting: Internal Medicine

## 2019-06-24 DIAGNOSIS — R634 Abnormal weight loss: Secondary | ICD-10-CM

## 2019-06-24 DIAGNOSIS — R109 Unspecified abdominal pain: Secondary | ICD-10-CM

## 2019-06-26 ENCOUNTER — Inpatient Hospital Stay: Admission: RE | Admit: 2019-06-26 | Payer: Medicare Other | Source: Ambulatory Visit

## 2019-07-01 ENCOUNTER — Ambulatory Visit
Admission: RE | Admit: 2019-07-01 | Discharge: 2019-07-01 | Disposition: A | Discharge status: Home / Self Care | Payer: Medicare Other | Source: Ambulatory Visit | Attending: Internal Medicine | Admitting: Internal Medicine

## 2019-07-01 DIAGNOSIS — R634 Abnormal weight loss: Secondary | ICD-10-CM

## 2019-07-01 DIAGNOSIS — R109 Unspecified abdominal pain: Secondary | ICD-10-CM

## 2019-07-01 MED ORDER — IOPAMIDOL (ISOVUE-300) INJECTION 61%
100.0000 mL | Freq: Once | INTRAVENOUS | Status: AC | PRN
  Administered 2019-07-01: 100 mL via INTRAVENOUS

## 2019-07-04 ENCOUNTER — Other Ambulatory Visit: Payer: Self-pay | Admitting: Internal Medicine

## 2019-07-04 DIAGNOSIS — R1900 Intra-abdominal and pelvic swelling, mass and lump, unspecified site: Secondary | ICD-10-CM

## 2019-07-11 ENCOUNTER — Ambulatory Visit
Admission: RE | Admit: 2019-07-11 | Discharge: 2019-07-11 | Disposition: A | Discharge status: Home / Self Care | Payer: Medicare Other | Source: Ambulatory Visit | Attending: Internal Medicine | Admitting: Internal Medicine

## 2019-07-11 DIAGNOSIS — R1900 Intra-abdominal and pelvic swelling, mass and lump, unspecified site: Secondary | ICD-10-CM

## 2019-07-11 MED ORDER — GADOBENATE DIMEGLUMINE 529 MG/ML IV SOLN
10.0000 mL | Freq: Once | INTRAVENOUS | Status: AC | PRN
  Administered 2019-07-11: 10 mL via INTRAVENOUS

## 2019-07-16 ENCOUNTER — Other Ambulatory Visit (HOSPITAL_COMMUNITY): Payer: Self-pay | Admitting: Internal Medicine

## 2019-07-16 DIAGNOSIS — R1900 Intra-abdominal and pelvic swelling, mass and lump, unspecified site: Secondary | ICD-10-CM

## 2019-07-17 ENCOUNTER — Telehealth: Payer: Self-pay | Admitting: *Deleted

## 2019-07-17 ENCOUNTER — Encounter (HOSPITAL_COMMUNITY): Payer: Self-pay | Admitting: Radiology

## 2019-07-17 NOTE — Progress Notes (Unsigned)
Laurie Pugh Female, 85 y.o., Mar 02, 1932 MRN:  [id number] Phone:  [phone] (H) PCP:  Crist Infante, MD Coverage:  [place] [place] [facility] ([facility]) 07/23/2019 at 11:00 AM FW: Consult request from Dr. Joylene Draft Received: 2 days ago Message Contents  Charma Igo,   Here is the review. I have the order. Let me know if you want me to put it in epic. Can you schedule? Morey Hummingbird said you have N-Z lol   Thanks,  Ash   Previous Messages  ----- Message -----  From: Aletta Edouard, MD  Sent: 07/15/2019  1:07 PM EDT  To: Danielle Dess  Subject: RE: Consult request from Dr. Joylene Draft        No, can just schedule for CT guided presacral mass biopsy. If they want me to do, can sched w/ me. Has to come off whatever blood thinner she is on per protocol for a deep biopsy. Needs sedation.   GY   ----- Message -----  From: Danielle Dess  Sent: 07/15/2019 11:31 AM EDT  To: Aletta Edouard, MD  Subject: Consult request from Dr. Joylene Draft          Dr. Kathlene Cote,   I just received a referral from Dr. Crist Infante at [facility].   Reason: To Dr. Kathlene Cote. Make visit to discuss this presacral mass and the best way to biopsy it. She is on blood thinner so that will have to be taken into consideration. Thank you.   Imaging: MRI done 07/11/19 in Epic.   Do you want me to send them to the clinic?   Thanks,  Lia Foyer

## 2019-07-17 NOTE — Telephone Encounter (Signed)
   [facility] Pre-operative Risk Assessment    Request for surgical clearance:  1. What type of surgery is being performed? Ct biopsy for a presacral mass  2. When is this surgery scheduled? TBD  3. What type of clearance is required (medical clearance vs. Pharmacy clearance to hold med vs. Both)? both  4. Are there any medications that need to be held prior to surgery and how long? eliquis for 48 hours   5. Practice name and name of physician performing surgery? Dr Aletta Edouard md   6. What is your office phone number [phone]   7.   What is your office fax number [phone]  8.   Anesthesia type (None, local, MAC, general) ? Not listed   Laurie Pugh 07/17/2019, 7:01 AM  _________________________________________________________________   (provider comments below)

## 2019-07-17 NOTE — Telephone Encounter (Signed)
Pt takes Eliquis for afib with CHADS2VASc score of 5 (age x2, sex, HTN, CAD). Renal function is normal. Ok to hold Eliquis for 2 days prior to procedure. 

## 2019-07-17 NOTE — Telephone Encounter (Signed)
   Primary Cardiologist: Pixie Casino, MD  Chart reviewed as part of pre-operative protocol coverage. Given past medical history and time since last visit, based on ACC/AHA guidelines, Laurie Pugh would be at acceptable risk for the planned procedure without further cardiovascular testing.   Per pharmacy, pt takes Eliquis for atrial fibrillation with CHADS2VASc score of 5 (age x2, sex, HTN, CAD). Renal function is normal. It is acceptable to hold Eliquis for 2 days prior to procedure.  I will route this recommendation to the requesting party via Epic fax function and remove from pre-op pool.  Please call with questions.  Kathyrn Drown, NP 07/17/2019, 9:06 AM

## 2019-07-22 ENCOUNTER — Other Ambulatory Visit: Payer: Self-pay | Admitting: Radiology

## 2019-07-23 ENCOUNTER — Other Ambulatory Visit: Payer: Self-pay

## 2019-07-23 ENCOUNTER — Ambulatory Visit (HOSPITAL_COMMUNITY)
Admission: RE | Admit: 2019-07-23 | Discharge: 2019-07-23 | Disposition: A | Discharge status: Home / Self Care | Payer: Medicare Other | Source: Ambulatory Visit | Attending: Internal Medicine | Admitting: Internal Medicine

## 2019-07-23 ENCOUNTER — Encounter (HOSPITAL_COMMUNITY): Payer: Self-pay

## 2019-07-23 DIAGNOSIS — Z8249 Family history of ischemic heart disease and other diseases of the circulatory system: Secondary | ICD-10-CM | POA: Insufficient documentation

## 2019-07-23 DIAGNOSIS — Z881 Allergy status to other antibiotic agents status: Secondary | ICD-10-CM | POA: Diagnosis not present

## 2019-07-23 DIAGNOSIS — Z825 Family history of asthma and other chronic lower respiratory diseases: Secondary | ICD-10-CM | POA: Diagnosis not present

## 2019-07-23 DIAGNOSIS — K219 Gastro-esophageal reflux disease without esophagitis: Secondary | ICD-10-CM | POA: Insufficient documentation

## 2019-07-23 DIAGNOSIS — Z7901 Long term (current) use of anticoagulants: Secondary | ICD-10-CM | POA: Diagnosis not present

## 2019-07-23 DIAGNOSIS — Z79899 Other long term (current) drug therapy: Secondary | ICD-10-CM | POA: Diagnosis not present

## 2019-07-23 DIAGNOSIS — Z6820 Body mass index (BMI) 20.0-20.9, adult: Secondary | ICD-10-CM | POA: Diagnosis not present

## 2019-07-23 DIAGNOSIS — I480 Paroxysmal atrial fibrillation: Secondary | ICD-10-CM | POA: Diagnosis not present

## 2019-07-23 DIAGNOSIS — I10 Essential (primary) hypertension: Secondary | ICD-10-CM | POA: Insufficient documentation

## 2019-07-23 DIAGNOSIS — R1900 Intra-abdominal and pelvic swelling, mass and lump, unspecified site: Secondary | ICD-10-CM | POA: Insufficient documentation

## 2019-07-23 DIAGNOSIS — R634 Abnormal weight loss: Secondary | ICD-10-CM | POA: Diagnosis not present

## 2019-07-23 DIAGNOSIS — Z87891 Personal history of nicotine dependence: Secondary | ICD-10-CM | POA: Diagnosis not present

## 2019-07-23 DIAGNOSIS — E785 Hyperlipidemia, unspecified: Secondary | ICD-10-CM | POA: Insufficient documentation

## 2019-07-23 DIAGNOSIS — R140 Abdominal distension (gaseous): Secondary | ICD-10-CM | POA: Insufficient documentation

## 2019-07-23 LAB — CBC
HCT: 50.3 % — ABNORMAL HIGH (ref 36.0–46.0)
Hemoglobin: 16.4 g/dL — ABNORMAL HIGH (ref 12.0–15.0)
MCH: 31.7 pg (ref 26.0–34.0)
MCHC: 32.6 g/dL (ref 30.0–36.0)
MCV: 97.3 fL (ref 80.0–100.0)
Platelets: UNDETERMINED 10*3/uL (ref 150–400)
RBC: 5.17 MIL/uL — ABNORMAL HIGH (ref 3.87–5.11)
RDW: 13.3 % (ref 11.5–15.5)
WBC: 8.2 10*3/uL (ref 4.0–10.5)
nRBC: 0 % (ref 0.0–0.2)

## 2019-07-23 LAB — PROTIME-INR
INR: 1 (ref 0.8–1.2)
Prothrombin Time: 13.1 seconds (ref 11.4–15.2)

## 2019-07-23 MED ORDER — LIDOCAINE HCL 1 % IJ SOLN
INTRAMUSCULAR | Status: AC
  Filled 2019-07-23: qty 20

## 2019-07-23 MED ORDER — FENTANYL CITRATE (PF) 100 MCG/2ML IJ SOLN
INTRAMUSCULAR | Status: AC | PRN
  Administered 2019-07-23 (×2): 25 ug via INTRAVENOUS

## 2019-07-23 MED ORDER — MIDAZOLAM HCL 2 MG/2ML IJ SOLN
INTRAMUSCULAR | Status: AC | PRN
  Administered 2019-07-23: 1 mg via INTRAVENOUS

## 2019-07-23 MED ORDER — SODIUM CHLORIDE 0.9 % IV SOLN: INTRAVENOUS | Status: DC

## 2019-07-23 MED ORDER — MIDAZOLAM HCL 2 MG/2ML IJ SOLN
INTRAMUSCULAR | Status: AC
  Filled 2019-07-23: qty 2

## 2019-07-23 MED ORDER — FENTANYL CITRATE (PF) 100 MCG/2ML IJ SOLN
INTRAMUSCULAR | Status: AC
  Filled 2019-07-23: qty 2

## 2019-07-23 NOTE — H&P (Signed)
Chief Complaint: Patient was seen in consultation today for pre sacral mass biopsy at the request of Perini,Mark  Referring Physician(s): Perini,Mark  Supervising Physician: Irish Lack  Patient Status: [facility] - Out-pt  History of Present Illness: Laurie Pugh is a 85 y.o. female   Pt known to IR  Previous VP L1 03/15/19  Back pain some better  Has developed bloating and abd pain now several weeks Denies N/V +wt loss  CT 07/01/19: Complex fat containing mass in the presacral space. This could represent a small liposarcoma. MRI of the pelvis with and without contrast recommended for further characterization.  MR 07/11/19:IMPRESSION: Inhomogeneous minimally enhancing fat containing presacral mass. Differential diagnosis includes myelolipoma, extramedullary hematopoiesis, liposarcoma or germ cell tumor. Percutaneous needle biopsy recommended for further evaluation.  Scheduled for pre sacral mass biopsy per Dr Waynard Edwards request Takes Eliquis half tablet 2x/day LD 4 days ago  Past Medical History:  Diagnosis Date   Allergy    Angina pectoris, unstable (HCC)    Atrial fibrillation (HCC)    Cataract    Dyslipidemia    Dysrhythmia    Esophageal stricture    GERD (gastroesophageal reflux disease)    Hyperlipidemia    Hypertension    Neck pain    PAF (paroxysmal atrial fibrillation) (HCC)    PSVT (paroxysmal supraventricular tachycardia) (HCC)     Past Surgical History:  Procedure Laterality Date   ABDOMINAL HYSTERECTOMY     APPENDECTOMY     BLADDER SURGERY     bladder surgery   BREAST EXCISIONAL BIOPSY     CARDIAC CATHETERIZATION  06/28/2004   no significant CAD (Dr. Laurell Josephs)   CATARACT EXTRACTION, BILATERAL  2008   CYSTOSCOPY W/ RETROGRADES Bilateral 04/11/2016   Procedure: CYSTOSCOPY WITH BILATERAL RETROGRADE PYELOGRAM;  Surgeon: Hildred Laser, MD;  Location: [facility];  Service: Urology;  Laterality: Bilateral;    ESOPHAGOGASTRODUODENOSCOPY N/A 01/30/2017   Procedure: ESOPHAGOGASTRODUODENOSCOPY (EGD);  Surgeon: Hilarie Fredrickson, MD;  Location: [facility];  Service: Endoscopy;  Laterality: N/A;   ESOPHAGOGASTRODUODENOSCOPY N/A 01/30/2017   Procedure: ESOPHAGOGASTRODUODENOSCOPY (EGD);  Surgeon: Napoleon Form, MD;  Location: [facility];  Service: Endoscopy;  Laterality: N/A;   IR VERTEBROPLASTY LUMBAR BX INC UNI/BIL INC/INJECT/IMAGING  03/15/2019   TRANSTHORACIC ECHOCARDIOGRAM  2009   borderline conc LVH; trace MR; mild-mod TR, RVSP 30-1mmHg    Allergies: Codeine and Erythromycin  Medications: Prior to Admission medications   Medication Sig Start Date End Date Taking? Authorizing Provider  apixaban (ELIQUIS) 5 MG TABS tablet Take 2.5 mg by mouth 2 (two) times daily.   Yes [provider]  atenolol (TENORMIN) 25 MG tablet TAKE 1 TABLET BY MOUTH  DAILY 05/20/19  Yes Hilty, Lisette Abu, MD  benazepril (LOTENSIN) 20 MG tablet Take 20 mg by mouth daily.  05/30/13  Yes [provider]  chlorthalidone (HYGROTON) 25 MG tablet Take 12.5-25 mg by mouth daily as needed (for SBP >140).   Yes [provider]  gabapentin (NEURONTIN) 100 MG capsule Take 100 mg by mouth 3 (three) times daily.  03/21/17  Yes [provider]  LORazepam (ATIVAN) 1 MG tablet Take 0.5-1 mg by mouth 2 (two) times daily as needed for anxiety.  05/30/13  Yes [provider]  Multiple Vitamins-Minerals (MULTIVITAMIN WITH MINERALS) tablet Take 1 tablet by mouth daily.   Yes [provider]  omeprazole (PRILOSEC) 20 MG capsule Take 20 mg by mouth daily.   Yes [provider]  oxyCODONE-acetaminophen (PERCOCET/ROXICET) 5-325 MG  tablet Take 1 tablet by mouth every 6 (six) hours as needed (pain).  02/06/19  Yes [provider]  rosuvastatin (CRESTOR) 10 MG tablet Take 10 mg by mouth at bedtime.    Yes [provider]  TYMLOS 3120 MCG/1.56ML SOPN Inject 80 mcg into the skin  daily. 02/28/19  Yes [provider]     Family History  Problem Relation Age of Onset   Heart disease Mother    Heart disease Sister    Suicidality Brother    Cancer Sister    COPD Sister    Suicidality Child    Colon cancer Neg Hx    Colon polyps Neg Hx    Esophageal cancer Neg Hx    Rectal cancer Neg Hx    Stomach cancer Neg Hx     Social History   Socioeconomic History   Marital status: Widowed    Spouse name: Not on file   Number of children: 4   Years of education: Not on file   Highest education level: Not on file  Occupational History   Occupation: retired  Scientist, product/process development strain: Not on file   Food insecurity    Worry: Not on file    Inability: Not on Lexicographer needs    Medical: Not on file    Non-medical: Not on file  Tobacco Use   Smoking status: Former Smoker    Packs/day: 1.00    Years: 40.00    Pack years: 40.00    Quit date: 06/25/2003    Years since quitting: 16.0   Smokeless tobacco: Never Used  Substance and Sexual Activity   Alcohol use: No   Drug use: No   Sexual activity: Not on file  Lifestyle   Physical activity    Days per week: Not on file    Minutes per session: Not on file   Stress: Not on file  Relationships   Social connections    Talks on phone: Not on file    Gets together: Not on file    Attends religious service: Not on file    Active member of club or organization: Not on file    Attends meetings of clubs or organizations: Not on file    Relationship status: Not on file  Other Topics Concern   Not on file  Social History Narrative   Not on file    Review of Systems: A 12 point ROS discussed and pertinent positives are indicated in the HPI above.  All other systems are negative.  Review of Systems  Constitutional: Positive for activity change, appetite change and unexpected weight change. Negative for fever.  Respiratory: Negative for cough and  shortness of breath.   Cardiovascular: Negative for chest pain.  Gastrointestinal: Positive for abdominal distention, abdominal pain, nausea and vomiting.  Neurological: Positive for weakness.  Psychiatric/Behavioral: Negative for behavioral problems and confusion.    Vital Signs: BP (!) 166/88    Pulse 73    Temp 98.1 F (36.7 C) (Oral)    Resp 16    Ht 5\' 3"  (1.6 m)    Wt 116 lb (52.6 kg)    SpO2 94%    BMI 20.55 kg/m   Physical Exam Vitals signs reviewed.  Cardiovascular:     Rate and Rhythm: Normal rate and regular rhythm.     Heart sounds: Normal heart sounds.  Pulmonary:     Breath sounds: Normal breath sounds.  Abdominal:  General: There is distension.     Palpations: Abdomen is soft.     Tenderness: There is abdominal tenderness.  Musculoskeletal: Normal range of motion.  Skin:    General: Skin is warm and dry.  Neurological:     Mental Status: She is alert and oriented to person, place, and time.  Psychiatric:        Mood and Affect: Mood normal.        Behavior: Behavior normal.        Thought Content: Thought content normal.        Judgment: Judgment normal.     Imaging: Ct Chest W Contrast  Result Date: 07/02/2019 CLINICAL DATA:  Abnormal weight loss. Abdominal bloating. EXAM: CT CHEST, ABDOMEN, AND PELVIS WITH CONTRAST TECHNIQUE: Multidetector CT imaging of the chest, abdomen and pelvis was performed following the standard protocol during bolus administration of intravenous contrast. CONTRAST:  ISOVUE-300 IOPAMIDOL (ISOVUE-300) INJECTION 61% COMPARISON:  CT scan of the abdomen dated 04/08/2016. FINDINGS: CT CHEST FINDINGS Cardiovascular: Aortic atherosclerosis. Mild cardiomegaly. Scattered coronary artery calcifications. No pericardial effusion. Enlarged atria. Prominent pulmonary arteries. Mediastinum/Nodes: No enlarged mediastinal, hilar, or axillary lymph nodes. Thyroid gland and trachea appear normal. Large chronic hiatal hernia containing most of the  stomach. Lungs/Pleura: Scarring at both lung apices, right greater than left. Extensive centrilobular emphysema. Musculoskeletal: No chest wall mass or suspicious bone lesions identified. CT ABDOMEN PELVIS FINDINGS Hepatobiliary: Liver parenchyma is normal. Multiple tiny stones in the slightly distended gallbladder. No bile duct dilatation. Pancreas: Unremarkable. No pancreatic ductal dilatation or surrounding inflammatory changes. Spleen: Normal in size without focal abnormality. Adrenals/Urinary Tract: Normal adrenal glands. Exophytic 2.7 cm cyst on the lower pole of the left kidney. Tiny cysts in the mid left kidney. Tiny cyst on the lower pole the right kidney. No hydronephrosis. Bladder is normal. Stomach/Bowel: Large hiatal hernia. Scattered diverticula in the colon. Appendectomy. Vascular/Lymphatic: Extensive aortic atherosclerosis. No adenopathy. Reproductive: Status post hysterectomy. No adnexal masses. Other: There is a 3.2 x 2.8 x 2.3 cm inhomogeneous fat containing mass in the presacral space. There are some areas of increased density within this fatty mass. Pelvic floor laxity. Musculoskeletal: Old stable compression fracture of L1. No acute abnormalities. IMPRESSION: 1. No acute abnormalities of the chest. Cardiomegaly with enlarged atria and pulmonary arteries. 2. Emphysema. 3. Large chronic hiatal hernia containing most of the stomach. 4. Complex fat containing mass in the presacral space. This could represent a small liposarcoma. MRI of the pelvis with and without contrast recommended for further characterization. 5. Cholelithiasis. Aortic Atherosclerosis (ICD10-I70.0) and Emphysema (ICD10-J43.9). Electronically Signed   By: Francene Boyers M.D.   On: 07/02/2019 08:33   Mr Pelvis W Wo Contrast  Result Date: 07/12/2019 CLINICAL DATA:  Complex fat containing presacral mass seen on CT scan of the abdomen and pelvis dated 07/01/2019. EXAM: MRI PELVIS WITHOUT AND WITH CONTRAST TECHNIQUE: Multiplanar  multisequence MR imaging of the pelvis was performed both before and after administration of intravenous contrast. CONTRAST:  10mL MULTIHANCE GADOBENATE DIMEGLUMINE 529 MG/ML IV SOLN COMPARISON:  CT scan dated 07/01/2019 FINDINGS: Musculoskeletal: There is a 3.7 x 2.7 x 2.5 cm inhomogeneous fat and fluid containing presacral mass with minimal enhancement after contrast administration. The differential diagnosis of this lesion includes myelolipoma, extramedullary hematopoiesis, liposarcoma or germ cell tumor. Urinary Tract:  No abnormality visualized. Bowel:  Unremarkable visualized pelvic bowel loops. Vascular/Lymphatic: No pathologically enlarged lymph nodes. No significant vascular abnormality seen. Reproductive: No mass or other significant abnormality. Hysterectomy. Other:  None IMPRESSION: Inhomogeneous minimally enhancing fat containing presacral mass. Differential diagnosis includes myelolipoma, extramedullary hematopoiesis, liposarcoma or germ cell tumor. Percutaneous needle biopsy recommended for further evaluation. Reference article: [email or web address] Electronically Signed   By: Francene BoyersJames  Maxwell M.D.   On: 07/12/2019 10:13   Ct Abdomen Pelvis W Contrast  Result Date: 07/02/2019 CLINICAL DATA:  Abnormal weight loss. Abdominal bloating. EXAM: CT CHEST, ABDOMEN, AND PELVIS WITH CONTRAST TECHNIQUE: Multidetector CT imaging of the chest, abdomen and pelvis was performed following the standard protocol during bolus administration of intravenous contrast. CONTRAST:  100mL ISOVUE-300 IOPAMIDOL (ISOVUE-300) INJECTION 61% COMPARISON:  CT scan of the abdomen dated 04/08/2016. FINDINGS: CT CHEST FINDINGS Cardiovascular: Aortic atherosclerosis. Mild cardiomegaly. Scattered coronary artery calcifications. No pericardial effusion. Enlarged atria. Prominent pulmonary arteries. Mediastinum/Nodes: No enlarged mediastinal, hilar, or axillary lymph nodes. Thyroid gland and trachea appear  normal. Large chronic hiatal hernia containing most of the stomach. Lungs/Pleura: Scarring at both lung apices, right greater than left. Extensive centrilobular emphysema. Musculoskeletal: No chest wall mass or suspicious bone lesions identified. CT ABDOMEN PELVIS FINDINGS Hepatobiliary: Liver parenchyma is normal. Multiple tiny stones in the slightly distended gallbladder. No bile duct dilatation. Pancreas: Unremarkable. No pancreatic ductal dilatation or surrounding inflammatory changes. Spleen: Normal in size without focal abnormality. Adrenals/Urinary Tract: Normal adrenal glands. Exophytic 2.7 cm cyst on the lower pole of the left kidney. Tiny cysts in the mid left kidney. Tiny cyst on the lower pole the right kidney. No hydronephrosis. Bladder is normal. Stomach/Bowel: Large hiatal hernia. Scattered diverticula in the colon. Appendectomy. Vascular/Lymphatic: Extensive aortic atherosclerosis. No adenopathy. Reproductive: Status post hysterectomy. No adnexal masses. Other: There is a 3.2 x 2.8 x 2.3 cm inhomogeneous fat containing mass in the presacral space. There are some areas of increased density within this fatty mass. Pelvic floor laxity. Musculoskeletal: Old stable compression fracture of L1. No acute abnormalities. IMPRESSION: 1. No acute abnormalities of the chest. Cardiomegaly with enlarged atria and pulmonary arteries. 2. Emphysema. 3. Large chronic hiatal hernia containing most of the stomach. 4. Complex fat containing mass in the presacral space. This could represent a small liposarcoma. MRI of the pelvis with and without contrast recommended for further characterization. 5. Cholelithiasis. Aortic Atherosclerosis (ICD10-I70.0) and Emphysema (ICD10-J43.9). Electronically Signed   By: Francene BoyersJames  Maxwell M.D.   On: 07/02/2019 08:33    Labs:  CBC: Recent Labs    03/15/19 0704 07/23/19 0900  WBC 8.1 8.2  HGB 15.4* 16.4*  HCT 47.7* 50.3*  PLT 208 PENDING    COAGS: Recent Labs     03/15/19 0704 07/23/19 0900  INR 1.0 1.0    BMP: Recent Labs    03/15/19 0704  NA 141  K 3.5  CL 102  CO2 28  GLUCOSE 126*  BUN 17  CALCIUM 9.7  CREATININE 0.70  GFRNONAA >60  GFRAA >60    LIVER FUNCTION TESTS: No results for input(s): BILITOT, AST, ALT, ALKPHOS, PROT, ALBUMIN in the last 8760 hours.  TUMOR MARKERS: No results for input(s): AFPTM, CEA, CA199, CHROMGRNA in the last 8760 hours.  Assessment and Plan:  Abd pain and bloating Wt loss Pre sacral mass noted on imaging Scheduled for biopsy of same  Risks and benefits of pre sacral mass bx was discussed with the patient and/or patient's family including, but not limited to bleeding, infection, damage to adjacent structures or low yield requiring additional tests.  All of the questions were answered and there is agreement to proceed. Consent signed and in chart.  Thank you  for this interesting consult.  I greatly enjoyed meeting Laurie Pugh and look forward to participating in their care.  A copy of this report was sent to the requesting provider on this date.  Electronically Signed: Robet Leu, PA-C 07/23/2019, 9:46 AM   I spent a total of  30 Minutes   in face to face in clinical consultation, greater than 50% of which was counseling/coordinating care for pre sacral mass bx

## 2019-07-23 NOTE — Discharge Instructions (Signed)
Needle Biopsy, Care After °These instructions tell you how to care for yourself after your procedure. Your doctor may also give you more specific instructions. Call your doctor if you have any problems or questions. °What can I expect after the procedure? °After the procedure, it is common to have: °· Soreness. °· Bruising. °· Mild pain. °Follow these instructions at home: ° °· Return to your normal activities as told by your doctor. Ask your doctor what activities are safe for you. °· Take over-the-counter and prescription medicines only as told by your doctor. °· Wash your hands with soap and water before you change your bandage (dressing). If you cannot use soap and water, use hand sanitizer. °· Follow instructions from your doctor about: °? How to take care of your puncture site. °? When and how to change your bandage. °? When to remove your bandage. °· Check your puncture site every day for signs of infection. Watch for: °? Redness, swelling, or pain. °? Fluid or blood.  °? Pus or a bad smell. °? Warmth. °· Do not take baths, swim, or use a hot tub until your doctor approves. Ask your doctor if you may take showers. You may only be allowed to take sponge baths. °· Keep all follow-up visits as told by your doctor. This is important. °Contact a doctor if you have: °· A fever. °· Redness, swelling, or pain at the puncture site, and it lasts longer than a few days. °· Fluid, blood, or pus coming from the puncture site. °· Warmth coming from the puncture site. °Get help right away if: °· You have a lot of bleeding from the puncture site. °Summary °· After the procedure, it is common to have soreness, bruising, or mild pain at the puncture site. °· Check your puncture site every day for signs of infection, such as redness, swelling, or pain. °· Get help right away if you have severe bleeding from your puncture site. °This information is not intended to replace advice given to you by your health care provider. Make  sure you discuss any questions you have with your health care provider. °Document Released: 08/25/2008 Document Revised: 09/25/2017 Document Reviewed: 09/25/2017 °Elsevier Patient Education © 2020 [employer] ° °

## 2019-07-23 NOTE — Procedures (Signed)
Interventional Radiology Procedure Note  Procedure: CT Guided Biopsy of presacral mass  Complications: None  Estimated Blood Loss: < 10 mL  Findings: 18 G core biopsy of presacral mass performed under CT guidance.  Three core samples obtained and sent to Pathology.  Venetia Night. Kathlene Cote, M.D Pager:  [phone]

## 2019-07-23 NOTE — Progress Notes (Signed)
Pt may start eliquis tomorrow per Jannifer Franklin pa, pt informed

## 2019-07-25 LAB — SURGICAL PATHOLOGY

## 2019-08-08 ENCOUNTER — Encounter: Payer: Self-pay | Admitting: Physician Assistant

## 2019-08-16 ENCOUNTER — Telehealth: Payer: Self-pay | Admitting: *Deleted

## 2019-08-16 ENCOUNTER — Ambulatory Visit: Payer: Medicare Other | Admitting: Physician Assistant

## 2019-08-16 ENCOUNTER — Encounter: Payer: Self-pay | Admitting: Physician Assistant

## 2019-08-16 VITALS — BP 128/82 | HR 76 | Temp 96.9°F | Ht 63.0 in | Wt 113.5 lb

## 2019-08-16 DIAGNOSIS — K5900 Constipation, unspecified: Secondary | ICD-10-CM

## 2019-08-16 DIAGNOSIS — R140 Abdominal distension (gaseous): Secondary | ICD-10-CM

## 2019-08-16 DIAGNOSIS — R634 Abnormal weight loss: Secondary | ICD-10-CM | POA: Diagnosis not present

## 2019-08-16 DIAGNOSIS — R1013 Epigastric pain: Secondary | ICD-10-CM | POA: Diagnosis not present

## 2019-08-16 DIAGNOSIS — Z1159 Encounter for screening for other viral diseases: Secondary | ICD-10-CM | POA: Diagnosis not present

## 2019-08-16 MED ORDER — OMEPRAZOLE 40 MG PO CPDR: 40.0000 mg | DELAYED_RELEASE_CAPSULE | Freq: Every day | ORAL | 3 refills | Status: DC

## 2019-08-16 NOTE — Progress Notes (Signed)
Assessment and plan reviewed.  Complicated patient for endoscopy as outlined

## 2019-08-16 NOTE — Patient Instructions (Addendum)
If you are age 83 or older, your body mass index should be between 23-30. Your Body mass index is 20.11 kg/m. If this is out of the aforementioned range listed, please consider follow up with your Primary Care Provider.  If you are age 58 or younger, your body mass index should be between 19-25. Your Body mass index is 20.11 kg/m. If this is out of the aformentioned range listed, please consider follow up with your Primary Care Provider.   You have been scheduled for an endoscopy. Please follow written instructions given to you at your visit today. If you use inhalers (even only as needed), please bring them with you on the day of your procedure.  We have sent the following medications to your pharmacy for you to pick up at your convenience:  1. Increase Omeprazole to 40 mg daily take 30-60 minutes before breakfast.   2. Start Miralax daily.

## 2019-08-16 NOTE — Progress Notes (Signed)
Chief Complaint: Abdominal pain, weight loss, abdominal distention  HPI:    [name] is an 85 year old Caucasian female with a past medical history of reflux, A. fib on Eliquis and others listed below, known to Dr. Marina GoodellPerry, who was referred to me by Rodrigo RanPerini, Mark, MD for a complaint of weight loss and abdominal pain.      12/06/2004 colonoscopy with diverticulosis, internal hemorrhoids and colon polyps.    04/11/2017 EGD for dysphagia with benign-appearing esophageal stenosis which was dilated, otherwise normal.    07/01/2019 CT of the chest, abdomen and pelvis with contrast with no acute abnormalities.  Cardiomegaly with enlarged atria and pulmonary arteries.  Emphysema.  Large chronic hiatal hernia containing most of the stomach, complex fat-containing mass in the presacral space which could represent a small liposarcoma.  MRI of the pelvis with and without contrast recommended.  Cholelithiasis.    07/11/2019 MRI of the pelvis with and without contrast with inhomogenous minimally enhancing fat-containing presacral mass.    07/23/2019 CT biopsy of presacral mass.  Biopsy showed scant tissue with hemorrhagic adipose tissue.  No overt malignancy.    08/07/2019 patient seen by PCP for abdominal pain and not eating well.  At that time discussed that she had cholelithiasis found on recent CT and she was referred to surgery who she sees in December.  Also discussed weight loss, she had lost 15 pounds in the past 6 months unintentionally and there were concerns by PCP for possible epigastric cause given upper abdominal pain as well.  CCP was questioning need for endoscopic work-up.  06/07/2019 CMP normal.  CBC normal.    Today, the patient presents to clinic and explains that she has had some extreme back pain which seems to radiate around to the front of her stomach, so this has made it hard for her to distinguish if she is having GI issues or just back pain.  Does tell me though that she has noted that her  abdomen is more distended than usual.  "I never had a flat stomach, but now it gets bigger than it has ever been".  Also describes an increase in gas.  Does tell me she consciously tries to have a bowel movement every day, if she does not have one she will take a suppository.  Does tell me though that she can have a bowel movement and not feel completely empty.  Along with this has been experiencing generalized abdominal pain which does feel some better after a bowel movement.    Also describes epigastric pain and heartburn regardless of her Omeprazole 20 mg daily.    Does admit to at least a 15 pound weight loss over the past 6 months and tells me that she has been eating the same as she has for years, explains as she got older she just did not feel like food tastes the same and does not eat quite as much.    Denies fever, chills, nausea, vomiting or symptoms that awaken her from sleep.  Past Medical History:  Diagnosis Date  . Allergy   . Angina pectoris, unstable (HCC)   . Atrial fibrillation (HCC)   . Cataract   . Dyslipidemia   . Dysrhythmia   . Esophageal stricture   . GERD (gastroesophageal reflux disease)   . Hyperlipidemia   . Hypertension   . Neck pain   . PAF (paroxysmal atrial fibrillation) (HCC)   . PSVT (paroxysmal supraventricular tachycardia) (HCC)     Past Surgical History:  Procedure Laterality Date  . ABDOMINAL HYSTERECTOMY    . APPENDECTOMY    . BLADDER SURGERY     bladder surgery  . BREAST EXCISIONAL BIOPSY    . CARDIAC CATHETERIZATION  06/28/2004   no significant CAD (Dr. Laurell Josephs)  . CATARACT EXTRACTION, BILATERAL  2008  . CYSTOSCOPY W/ RETROGRADES Bilateral 04/11/2016   Procedure: CYSTOSCOPY WITH BILATERAL RETROGRADE PYELOGRAM;  Surgeon: Hildred Laser, MD;  Location: [facility];  Service: Urology;  Laterality: Bilateral;  . ESOPHAGOGASTRODUODENOSCOPY N/A 01/30/2017   Procedure: ESOPHAGOGASTRODUODENOSCOPY (EGD);  Surgeon: Hilarie Fredrickson, MD;  Location: [facility];  Service: Endoscopy;  Laterality: N/A;  . ESOPHAGOGASTRODUODENOSCOPY N/A 01/30/2017   Procedure: ESOPHAGOGASTRODUODENOSCOPY (EGD);  Surgeon: Napoleon Form, MD;  Location: [facility];  Service: Endoscopy;  Laterality: N/A;  . IR VERTEBROPLASTY LUMBAR BX INC UNI/BIL INC/INJECT/IMAGING  03/15/2019  . TRANSTHORACIC ECHOCARDIOGRAM  2009   borderline conc LVH; trace MR; mild-mod TR, RVSP 30-2mmHg    Current Outpatient Medications  Medication Sig Dispense Refill  . apixaban (ELIQUIS) 5 MG TABS tablet Take 2.5 mg by mouth 2 (two) times daily.    Marland Kitchen atenolol (TENORMIN) 25 MG tablet TAKE 1 TABLET BY MOUTH  DAILY 90 tablet 2  . benazepril (LOTENSIN) 20 MG tablet Take 20 mg by mouth daily.     . chlorthalidone (HYGROTON) 25 MG tablet Take 12.5-25 mg by mouth daily as needed (for SBP >140).    . gabapentin (NEURONTIN) 100 MG capsule Take 100 mg by mouth 3 (three) times daily.     Marland Kitchen LORazepam (ATIVAN) 1 MG tablet Take 0.5-1 mg by mouth 2 (two) times daily as needed for anxiety.     . Multiple Vitamins-Minerals (MULTIVITAMIN WITH MINERALS) tablet Take 1 tablet by mouth daily.    Marland Kitchen oxyCODONE-acetaminophen (PERCOCET/ROXICET) 5-325 MG tablet Take 1 tablet by mouth every 6 (six) hours as needed (pain).     . rosuvastatin (CRESTOR) 10 MG tablet Take 10 mg by mouth at bedtime.     . TYMLOS 3120 MCG/1.56ML SOPN Inject 80 mcg into the skin daily.    Marland Kitchen omeprazole (PRILOSEC) 40 MG capsule Take 1 capsule (40 mg total) by mouth daily. 30 capsule 3   No current facility-administered medications for this visit.     Allergies as of 08/16/2019 - Review Complete 08/16/2019  Allergen Reaction Noted  . Codeine Nausea And Vomiting 04/16/2014  . Erythromycin Other (See Comments) 06/24/2013    Family History  Problem Relation Age of Onset  . Heart disease Mother   . Heart disease Sister   . Suicidality Brother   . Cancer Sister   . COPD Sister   . Suicidality Child   . Colon cancer Neg Hx   .  Colon polyps Neg Hx   . Esophageal cancer Neg Hx   . Rectal cancer Neg Hx   . Stomach cancer Neg Hx     Social History   Socioeconomic History  . Marital status: Widowed    Spouse name: Not on file  . Number of children: 4  . Years of education: Not on file  . Highest education level: Not on file  Occupational History  . Occupation: retired  Engineer, production  . Financial resource strain: Not on file  . Food insecurity    Worry: Not on file    Inability: Not on file  . Transportation needs    Medical: Not on file    Non-medical: Not on file  Tobacco Use  . Smoking  status: Former Smoker    Packs/day: 1.00    Years: 40.00    Pack years: 40.00    Quit date: 06/25/2003    Years since quitting: 16.1  . Smokeless tobacco: Never Used  Substance and Sexual Activity  . Alcohol use: No  . Drug use: No  . Sexual activity: Not on file  Lifestyle  . Physical activity    Days per week: Not on file    Minutes per session: Not on file  . Stress: Not on file  Relationships  . Social Herbalist on phone: Not on file    Gets together: Not on file    Attends religious service: Not on file    Active member of club or organization: Not on file    Attends meetings of clubs or organizations: Not on file    Relationship status: Not on file  . Intimate partner violence    Fear of current or ex partner: Not on file    Emotionally abused: Not on file    Physically abused: Not on file    Forced sexual activity: Not on file  Other Topics Concern  . Not on file  Social History Narrative  . Not on file    Review of Systems:    Constitutional: No weight loss, fever or chills Cardiovascular: No chest pain  Respiratory: No SOB Gastrointestinal: See HPI and otherwise negative   Physical Exam:  Vital signs: BP 128/82   Pulse 76   Temp (!) 96.9 F (36.1 C)   Ht 5\' 3"  (1.6 m)   Wt 113 lb 8 oz (51.5 kg)   BMI 20.11 kg/m   Constitutional:   Pleasant Elderly Caucasian female  appears to be in NAD, Well developed, Well nourished, alert and cooperative Head:  Normocephalic and atraumatic. Eyes:   PEERL, EOMI. No icterus. Conjunctiva pink. Ears:  Normal auditory acuity. Neck:  Supple Throat: Oral cavity and pharynx without inflammation, swelling or lesion.  Respiratory: Respirations even and unlabored. Lungs clear to auscultation bilaterally.   No wheezes, crackles, or rhonchi.  Cardiovascular: Normal S1, S2. No MRG. Regular rate and rhythm. No peripheral edema, cyanosis or pallor.  Gastrointestinal:  Soft, moderate abdominal distension with increased hypertympanic bowel sounds and generalized moderate ttp, marked in the epigastrum with involuntary guarding, No appreciable masses or hepatomegaly. Rectal:  Not performed.  Msk:  Symmetrical without gross deformities. Without edema, no deformity or joint abnormality.  Neurologic:  Alert and  oriented x4;  grossly normal neurologically.  Skin:   Dry and intact without significant lesions or rashes. Psychiatric: Demonstrates good judgement and reason without abnormal affect or behaviors.  See HPI for recent labs.  Assessment: 1.  Weight loss: 15 pounds unintentionally in the past 6 months; consider decrease in caloric intake versus cancer versus other 2.  Epigastric pain: Recent CT with large hiatal hernia containing most of the stomach, also with reflux ; consider gastritis plus/minus viral hernia versus other  3.  Constipation: Patient with incomplete bowel movements on a daily basis 4. Abdominal distension: consider relation to above  Plan: 1.  PCP requesting endoscopic evaluation.  Given patient's weight loss and epigastric pain at time of exam today with history of large hiatal hernia, will proceed with an EGD with Dr. Henrene Pastor in the [facility].  Did discuss risks, benefits, limitations and alternatives and the patient agrees to proceed.  Patient will be Covid tested 2 days prior to her procedure.  Pending results and patient  symptoms continuing could consider colonoscopy in the future. 2.  Patient was advised to hold her Eliquis for 2 days prior to her procedure.  We will communicate with her prescribing physician to ensure that holding her Eliquis is acceptable her. 3.  Recommend the patient start MiraLAX on a daily basis to ensure that constipation is not playing a role in her distention and abdominal discomfort. 4.  Increased Omeprazole to 40 mg daily, 30-60 minutes before breakfast #30 with 3 refills. 5.  Patient does have an appointment with surgery about her cholelithiasis because there is a question whether or not this is playing a role, CMP recently with normal LFTs.  Not sure if this is truly giving her symptoms, but probably worth a follow-up with them just to discuss her options. 6.  Patient to follow in clinic per recommendations from Dr. Marina Goodell after time of procedure  Hyacinth Meeker, PA-C [facility] 08/16/2019, 11:03 AM  Cc: Rodrigo Ran, MD

## 2019-08-16 NOTE — Telephone Encounter (Signed)
Patient with diagnosis of afib on Eliquis for anticoagulation.    Procedure: EGD Date of procedure: 09/04/2019  CHADS2-VASc score of  5 ( HTN, AGE, CAD, AGE, female)  CrCl 49 ml/min  Per office protocol, patient can hold Eliquis for 2 days prior to procedure.

## 2019-08-16 NOTE — Telephone Encounter (Signed)
Clinical pharmacist to review eliquis 

## 2019-08-16 NOTE — Telephone Encounter (Signed)
[facility] Pre-operative Risk Assessment     Request for surgical clearance:     Endoscopy Procedure  What type of surgery is being performed?     EGD  When is this surgery scheduled?     09/04/19  What type of clearance is required ?   Pharmacy  Are there any medications that need to be held prior to surgery and how long? Eliquis 2 days  Practice name and name of physician performing surgery?      [facility]  What is your office phone and fax number?      Phone- [phone]  [phone]  Anesthesia type (None, local, MAC, general) ?       MAC

## 2019-08-19 NOTE — Telephone Encounter (Signed)
Patient informed of clearance. Patient voiced understanding.

## 2019-08-28 ENCOUNTER — Telehealth: Payer: Self-pay | Admitting: Physician Assistant

## 2019-08-28 MED ORDER — OMEPRAZOLE 40 MG PO CPDR: 40.0000 mg | DELAYED_RELEASE_CAPSULE | Freq: Every day | ORAL | 1 refills | Status: DC

## 2019-08-28 NOTE — Telephone Encounter (Signed)
90 day script sent into the pharmacy.

## 2019-08-28 NOTE — Telephone Encounter (Signed)
Pt is requesting rf for prilosec sent to [facility] for 90-day supply. She states that copay is more affordable for her.

## 2019-08-28 NOTE — Telephone Encounter (Signed)
Tried calling patient to let her know script was sent in. Home phone did not work and cell phone did not have a voice mail set up.

## 2019-08-29 ENCOUNTER — Encounter: Payer: Self-pay | Admitting: Internal Medicine

## 2019-09-02 ENCOUNTER — Ambulatory Visit (INDEPENDENT_AMBULATORY_CARE_PROVIDER_SITE_OTHER): Payer: Medicare Other

## 2019-09-02 ENCOUNTER — Other Ambulatory Visit: Payer: Self-pay | Admitting: Internal Medicine

## 2019-09-02 DIAGNOSIS — Z1159 Encounter for screening for other viral diseases: Secondary | ICD-10-CM

## 2019-09-03 LAB — SARS CORONAVIRUS 2 (TAT 6-24 HRS): SARS Coronavirus 2: NEGATIVE

## 2019-09-04 ENCOUNTER — Other Ambulatory Visit: Payer: Self-pay

## 2019-09-04 ENCOUNTER — Ambulatory Visit (AMBULATORY_SURGERY_CENTER): Payer: Medicare Other | Admitting: Internal Medicine

## 2019-09-04 ENCOUNTER — Encounter: Payer: Self-pay | Admitting: Internal Medicine

## 2019-09-04 VITALS — BP 150/87 | HR 76 | Temp 97.3°F | Resp 22 | Ht 63.0 in | Wt 113.0 lb

## 2019-09-04 DIAGNOSIS — K222 Esophageal obstruction: Secondary | ICD-10-CM

## 2019-09-04 DIAGNOSIS — K2950 Unspecified chronic gastritis without bleeding: Secondary | ICD-10-CM

## 2019-09-04 DIAGNOSIS — R1013 Epigastric pain: Secondary | ICD-10-CM | POA: Diagnosis not present

## 2019-09-04 DIAGNOSIS — R140 Abdominal distension (gaseous): Secondary | ICD-10-CM

## 2019-09-04 DIAGNOSIS — K449 Diaphragmatic hernia without obstruction or gangrene: Secondary | ICD-10-CM

## 2019-09-04 DIAGNOSIS — K219 Gastro-esophageal reflux disease without esophagitis: Secondary | ICD-10-CM

## 2019-09-04 DIAGNOSIS — R634 Abnormal weight loss: Secondary | ICD-10-CM

## 2019-09-04 MED ORDER — SODIUM CHLORIDE 0.9 % IV SOLN: 500.0000 mL | Freq: Once | INTRAVENOUS | Status: DC

## 2019-09-04 NOTE — Op Note (Signed)
[facility] Patient Name: Laurie Pugh Procedure Date: 09/04/2019 10:15 AM MRN: [id number] Endoscopist: Wilhemina Bonito. Marina Goodell , MD Age: 85 Referring MD:  Date of Birth: 1932-05-31 Gender: Female Account #: [id number] Procedure:                Upper GI endoscopy Indications:              Generalized abdominal pain, Anorexia, Abdominal                            bloating, Weight loss Medicines:                Monitored Anesthesia Care Procedure:                Pre-Anesthesia Assessment:                           - Prior to the procedure, a History and Physical                            was performed, and patient medications and                            allergies were reviewed. The patient's tolerance of                            previous anesthesia was also reviewed. The risks                            and benefits of the procedure and the sedation                            options and risks were discussed with the patient.                            All questions were answered, and informed consent                            was obtained. Prior Anticoagulants: The patient has                            taken Eliquis (apixaban), last dose was 3 days                            prior to procedure. ASA Grade Assessment: III - A                            patient with severe systemic disease. After                            reviewing the risks and benefits, the patient was                            deemed in satisfactory condition to undergo the  procedure.                           After obtaining informed consent, the endoscope was                            passed under direct vision. Throughout the                            procedure, the patient's blood pressure, pulse, and                            oxygen saturations were monitored continuously. The                            Endoscope was introduced through the mouth, and          advanced to the second part of duodenum. The upper                            GI endoscopy was accomplished without difficulty.                            The patient tolerated the procedure well. Scope In: Scope Out: Findings:                 The esophagus was was foreshortened with a                            nonobstructing stricture at the gastroesophageal                            junction (33 cm).                           The stomach revealed a large (7 cm) nonobstructing                            hiatal hernia. The gastric mucosa was diffusely                            atrophic. No ulcer or mass.                           The examined duodenum was normal.                           The cardia and gastric fundus were normal on                            retroflexion save the hiatal hernia. Complications:            No immediate complications. Estimated Blood Loss:     Estimated blood loss: none. Impression:               1. Asymptomatic esophageal stricture  2. Large nonobstructing hiatal hernia                           3. Atrophic gastritis                           4. GERD                           5. No findings on this examination to explain                            patient's constellation of complaints (back pain,                            abdominal bloating, decreased appetite, weight                            loss). I am suspicious that all this may be the                            result of her previous back injury earlier this                            year.                           6. Cholelithiasis. Has seen general surgery for                            their opinion. Recommendation:           - Patient has a contact number available for                            emergencies. The signs and symptoms of potential                            delayed complications were discussed with the                            patient. Return  to normal activities tomorrow.                            Written discharge instructions were provided to the                            patient.                           - Resume previous diet.                           - Continue present medications.                           - Resume Eliquis (apixaban) at prior dose today.                           -  Return to the care of Dr. Stacie Glaze. Marina Goodell, MD 09/04/2019 10:42:06 AM This report has been signed electronically.

## 2019-09-04 NOTE — Patient Instructions (Signed)
Please read handouts provided. Continue present medications. Resume Eliquis ( apixaban ) at prior dose today. Return to care of of Dr. Joylene Draft.     If yes to any of these questions please route to Joylene John, RN and Alphonsa Gin, RN. YOU HAD AN ENDOSCOPIC PROCEDURE TODAY AT [facility]:   Refer to the procedure report that was given to you for any specific questions about what was found during the examination.  If the procedure report does not answer your questions, please call your gastroenterologist to clarify.  If you requested that your care partner not be given the details of your procedure findings, then the procedure report has been included in a sealed envelope for you to review at your convenience later.  YOU SHOULD EXPECT: Some feelings of bloating in the abdomen. Passage of more gas than usual.  Walking can help get rid of the air that was put into your GI tract during the procedure and reduce the bloating. If you had a lower endoscopy (such as a colonoscopy or flexible sigmoidoscopy) you may notice spotting of blood in your stool or on the toilet paper. If you underwent a bowel prep for your procedure, you may not have a normal bowel movement for a few days.  Please Note:  You might notice some irritation and congestion in your nose or some drainage.  This is from the oxygen used during your procedure.  There is no need for concern and it should clear up in a day or so.  SYMPTOMS TO REPORT IMMEDIATELY:     Following upper endoscopy (EGD)  Vomiting of blood or coffee ground material  New chest pain or pain under the shoulder blades  Painful or persistently difficult swallowing  New shortness of breath  Fever of 100F or higher  Black, tarry-looking stools  For urgent or emergent issues, a gastroenterologist can be reached at any hour by calling [phone].   DIET:  We do recommend a small meal at first, but then you may proceed to your regular diet.   Drink plenty of fluids but you should avoid alcoholic beverages for 24 hours.  ACTIVITY:  You should plan to take it easy for the rest of today and you should NOT DRIVE or use heavy machinery until tomorrow (because of the sedation medicines used during the test).    FOLLOW UP: Our staff will call the number listed on your records 48-72 hours following your procedure to check on you and address any questions or concerns that you may have regarding the information given to you following your procedure. If we do not reach you, we will leave a message.  We will attempt to reach you two times.  During this call, we will ask if you have developed any symptoms of COVID 19. If you develop any symptoms (ie: fever, flu-like symptoms, shortness of breath, cough etc.) before then, please call [phone].  If you test positive for Covid 19 in the 2 weeks post procedure, please call and report this information to [place].    If any biopsies were taken you will be contacted by phone or by letter within the next 1-3 weeks.  Please call us at [phone] if you have not heard about the biopsies in 3 weeks.    SIGNATURES/CONFIDENTIALITY: You and/or your care partner have signed paperwork which will be entered into your electronic medical record.  These signatures attest to the fact that that the information above on your After Visit  Summary has been reviewed and is understood.  Full responsibility of the confidentiality of this discharge information lies with you and/or your care-partner. 

## 2019-09-04 NOTE — Progress Notes (Signed)
Pt Drowsy. VSS. To PACU, report to RN. No anesthetic complications noted.  

## 2019-09-04 NOTE — Progress Notes (Signed)
Temp taken by JB VS taken by DT 

## 2019-09-06 ENCOUNTER — Telehealth: Payer: Self-pay | Admitting: *Deleted

## 2019-09-06 NOTE — Telephone Encounter (Signed)
  Follow up Call-  Call back number 09/04/2019 04/11/2017  Post procedure Call Back phone  # [phone]  Permission to leave phone message Yes Yes  Some recent data might be hidden     Patient questions:  Do you have a fever, pain , or abdominal swelling? No. Pain Score  0 *  Have you tolerated food without any problems? Yes.    Have you been able to return to your normal activities? Yes.    Do you have any questions about your discharge instructions: Diet   No. Medications  No. Follow up visit  No.  Do you have questions or concerns about your Care? No.  Actions: * If pain score is 4 or above: 1. No action needed, pain <4.Have you developed a fever since your procedure? no  2.   Have you had an respiratory symptoms (SOB or cough) since your procedure? no  3.   Have you tested positive for COVID 19 since your procedure no  4.   Have you had any family members/close contacts diagnosed with the COVID 19 since your procedure?  no   If yes to any of these questions please route to Joylene John, RN and Alphonsa Gin, Therapist, sports.

## 2019-09-30 ENCOUNTER — Telehealth: Payer: Self-pay | Admitting: Internal Medicine

## 2019-09-30 NOTE — Telephone Encounter (Signed)
  1. What dental office are you calling from? Dr Egbert Garibaldi and Dr Monia Pouch  2. What is your office phone number? [phone]  3. What is your fax number? [phone]  4. What type of procedure is the patient having performed? Tooth extraction  5. What date is procedure scheduled or is the patient there now? 09/30/19 (if the patient is at the dentist's office question goes to their cardiologist if he/she is in the office.  If not, question should go to the DOD).   6. What is your question (ex. Antibiotics prior to procedure, holding medication-we need to know how long dentist wants pt to hold med)? How long does patient need to be off Eliquis. Patient has not taken her dose this morning.

## 2019-09-30 NOTE — Telephone Encounter (Signed)
   Primary Cardiologist: Chrystie Nose, MD  Chart reviewed as part of pre-operative protocol coverage. Simple dental extractions are considered low risk procedures per guidelines and generally do not require any specific cardiac clearance. It is also generally accepted that for simple extractions and dental cleanings, there is no need to interrupt blood thinner therapy.   SBE prophylaxis is/is not required for the patient.  I will route this recommendation to the requesting party via Epic fax function and remove from pre-op pool.  Please call with questions.  Joni Reining, NP 09/30/2019, 2:39 PM

## 2019-11-09 ENCOUNTER — Ambulatory Visit (INDEPENDENT_AMBULATORY_CARE_PROVIDER_SITE_OTHER)
Admission: EM | Admit: 2019-11-09 | Discharge: 2019-11-09 | Disposition: A | Discharge status: Home / Self Care | Payer: Medicare Other | Source: Home / Self Care

## 2019-11-09 ENCOUNTER — Encounter: Payer: Self-pay | Admitting: Emergency Medicine

## 2019-11-09 ENCOUNTER — Other Ambulatory Visit: Payer: Self-pay

## 2019-11-09 ENCOUNTER — Emergency Department (HOSPITAL_COMMUNITY)
Admission: EM | Admit: 2019-11-09 | Discharge: 2019-11-09 | Disposition: A | Discharge status: Home / Self Care | Payer: Medicare Other | Attending: Emergency Medicine | Admitting: Emergency Medicine

## 2019-11-09 ENCOUNTER — Emergency Department (HOSPITAL_COMMUNITY): Payer: Medicare Other

## 2019-11-09 DIAGNOSIS — W109XXA Fall (on) (from) unspecified stairs and steps, initial encounter: Secondary | ICD-10-CM

## 2019-11-09 DIAGNOSIS — Y92008 Other place in unspecified non-institutional (private) residence as the place of occurrence of the external cause: Secondary | ICD-10-CM | POA: Insufficient documentation

## 2019-11-09 DIAGNOSIS — Z87891 Personal history of nicotine dependence: Secondary | ICD-10-CM | POA: Diagnosis not present

## 2019-11-09 DIAGNOSIS — S99922A Unspecified injury of left foot, initial encounter: Secondary | ICD-10-CM | POA: Diagnosis present

## 2019-11-09 DIAGNOSIS — W19XXXA Unspecified fall, initial encounter: Secondary | ICD-10-CM

## 2019-11-09 DIAGNOSIS — W108XXA Fall (on) (from) other stairs and steps, initial encounter: Secondary | ICD-10-CM | POA: Insufficient documentation

## 2019-11-09 DIAGNOSIS — Z79899 Other long term (current) drug therapy: Secondary | ICD-10-CM | POA: Insufficient documentation

## 2019-11-09 DIAGNOSIS — Z7901 Long term (current) use of anticoagulants: Secondary | ICD-10-CM | POA: Insufficient documentation

## 2019-11-09 DIAGNOSIS — Y9389 Activity, other specified: Secondary | ICD-10-CM | POA: Insufficient documentation

## 2019-11-09 DIAGNOSIS — S81812A Laceration without foreign body, left lower leg, initial encounter: Secondary | ICD-10-CM | POA: Insufficient documentation

## 2019-11-09 DIAGNOSIS — S92425A Nondisplaced fracture of distal phalanx of left great toe, initial encounter for closed fracture: Secondary | ICD-10-CM | POA: Diagnosis not present

## 2019-11-09 DIAGNOSIS — Y999 Unspecified external cause status: Secondary | ICD-10-CM | POA: Diagnosis not present

## 2019-11-09 DIAGNOSIS — I10 Essential (primary) hypertension: Secondary | ICD-10-CM | POA: Insufficient documentation

## 2019-11-09 MED ORDER — LIDOCAINE-EPINEPHRINE (PF) 2 %-1:200000 IJ SOLN
20.0000 mL | Freq: Once | INTRAMUSCULAR | Status: DC
  Filled 2019-11-09: qty 20

## 2019-11-09 NOTE — Discharge Instructions (Signed)
1. Medications: Can take [phone] mg of Tylenol every 6 hours as needed for pain. Do not exceed 4000 mg of Tylenol daily.   2. Treatment: ice for swelling, keep wound clean with warm soap and water and keep bandage dry, do not submerge in water for 24 hours. Elevate the extremity when not walking on it. 3. Follow Up: Please return in 10 days to have your stitches/staples removed or sooner if you have concerns.  You may also go to urgent care or your primary care physician.  Return to the emergency department sooner if any concerning signs or symptoms develop such as high fevers, redness, drainage of pus from the wound, or swelling.   WOUND CARE  Keep area clean and dry for 24 hours. Do not remove bandage, if applied.  After 24 hours, remove bandage and wash wound gently with mild soap and warm water. Reapply a new bandage after cleaning wound, if directed.   Continue daily cleansing with soap and water until stitches/staples are removed.  Do not apply any ointments or creams to the wound while stitches/staples are in place, as this may cause delayed healing. Return if you experience any of the following signs of infection: Swelling, redness, pus drainage, streaking, fever >101.0 F  Return if you experience excessive bleeding that does not stop after 15-20 minutes of constant, firm pressure.

## 2019-11-09 NOTE — Discharge Instructions (Signed)
Recommend he go to ER for further evaluation of your wound.

## 2019-11-09 NOTE — ED Provider Notes (Signed)
[facility] EMERGENCY DEPARTMENT Provider Note   CSN: [id number] Arrival date & time: 11/09/19  1040     History Chief Complaint  Patient presents with  . Level 2 /fall    Laurie Pugh is a 86 y.o. female with history of GERD, hypertension, hyperlipidemia, paroxysmal A. fib on Eliquis presents for evaluation of acute onset, persistent wound to the left lower extremity secondary to mechanical fall at around 8 AM.  She reports that she was walking outside going down some icy steps outside of her home when her left foot slipped out from under her causing her to land on her buttocks.  Denies head injury or loss of consciousness.  No prodrome leading up to the fall such as shortness of breath, lightheadedness, or chest pain or diaphoresis.  She reports that she sustained a laceration to the anterior aspect of the left lower leg and has mild soreness to the left great toe with some ecchymosis.  She has a burning pain around the site of the laceration which worsens with palpation and movement of her ankle.  Denies numbness or weakness of the extremities.  Has been ambulatory since the injury.  Went to urgent care but they recommended presentation to the ED due to her chronic anticoagulation which she reports compliance with.  Her tetanus is up-to-date  The history is provided by the patient.       Past Medical History:  Diagnosis Date  . Allergy   . Angina pectoris, unstable ([place])   . Atrial fibrillation ([place])   . Cataract   . Dyslipidemia   . Dysrhythmia   . Esophageal stricture   . GERD (gastroesophageal reflux disease)   . Hyperlipidemia   . Hypertension   . Neck pain   . PAF (paroxysmal atrial fibrillation) ([place])   . PSVT (paroxysmal supraventricular tachycardia) [facility])     Patient Active Problem List   Diagnosis Date Noted  . Stenosis of carotid artery 02/12/2018  . 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 Chronic anticoagulation 05/21/2017  . Esophageal  stricture 01/30/2017  . Esophageal dysphagia   . Esophageal obstruction due to food impaction   . Sinus bradycardia 05/26/2014  . PVC's (premature ventricular contractions) 05/26/2014  . Paroxysmal SVT (supraventricular tachycardia) ([place]) 07/16/2013  . Atrial fibrillation ([place]) 06/24/2013  . Mixed hyperlipidemia 06/24/2013  . Essential hypertension 06/24/2013  . Angina pectoris, unstable ([place]) 06/24/2013  . Neck pain 06/24/2013  . DOE (dyspnea on exertion) 06/24/2013    Past Surgical History:  Procedure Laterality Date  . ABDOMINAL HYSTERECTOMY    . APPENDECTOMY    . BLADDER SURGERY     bladder surgery  . BREAST EXCISIONAL BIOPSY    . CARDIAC CATHETERIZATION  06/28/2004   no significant CAD (Dr. Gerrie Nordmann)  . CATARACT EXTRACTION, BILATERAL  2008  . CYSTOSCOPY W/ RETROGRADES Bilateral 04/11/2016   Procedure: CYSTOSCOPY WITH BILATERAL RETROGRADE PYELOGRAM;  Surgeon: Nickie Retort, MD;  Location: [facility];  Service: Urology;  Laterality: Bilateral;  . ESOPHAGOGASTRODUODENOSCOPY N/A 01/30/2017   Procedure: ESOPHAGOGASTRODUODENOSCOPY (EGD);  Surgeon: Irene Shipper, MD;  Location: [facility];  Service: Endoscopy;  Laterality: N/A;  . ESOPHAGOGASTRODUODENOSCOPY N/A 01/30/2017   Procedure: ESOPHAGOGASTRODUODENOSCOPY (EGD);  Surgeon: Mauri Pole, MD;  Location: [facility];  Service: Endoscopy;  Laterality: N/A;  . IR VERTEBROPLASTY LUMBAR BX INC UNI/BIL INC/INJECT/IMAGING  03/15/2019  . TRANSTHORACIC ECHOCARDIOGRAM  2009   borderline conc LVH; trace MR; mild-mod TR, RVSP 30-75mmHg     OB  History   No obstetric history on file.     Family History  Problem Relation Age of Onset  . Heart disease Mother   . Heart disease Sister   . Suicidality Brother   . Cancer Sister   . COPD Sister   . Suicidality Child   . Colon cancer Neg Hx   . Colon polyps Neg Hx   . Esophageal cancer Neg Hx   . Rectal cancer Neg Hx   . Stomach cancer Neg Hx     Social History   Tobacco Use   . Smoking status: Former Smoker    Packs/day: 1.00    Years: 40.00    Pack years: 40.00    Quit date: 06/25/2003    Years since quitting: 16.3  . Smokeless tobacco: Never Used  Substance Use Topics  . Alcohol use: No  . Drug use: No    Home Medications Prior to Admission medications   Medication Sig Start Date End Date Taking? Authorizing Provider  apixaban (ELIQUIS) 5 MG TABS tablet Take 2.5 mg by mouth 2 (two) times daily.   Yes [provider]  atenolol (TENORMIN) 25 MG tablet TAKE 1 TABLET BY MOUTH  DAILY Patient taking differently: Take 25 mg by mouth daily.  05/20/19  Yes Hilty, Lisette Abu, MD  benazepril (LOTENSIN) 20 MG tablet Take 20 mg by mouth daily.  05/30/13  Yes [provider]  chlorthalidone (HYGROTON) 25 MG tablet Take 12.5-25 mg by mouth daily as needed (for SBP >140).   Yes [provider]  gabapentin (NEURONTIN) 100 MG capsule Take 100 mg by mouth 3 (three) times daily.  03/21/17  Yes [provider]  LORazepam (ATIVAN) 1 MG tablet Take 0.5-1 mg by mouth 2 (two) times daily as needed for anxiety.  05/30/13  Yes [provider]  Multiple Vitamins-Minerals (MULTIVITAMIN WITH MINERALS) tablet Take 1 tablet by mouth daily.   Yes [provider]  omeprazole (PRILOSEC) 40 MG capsule Take 1 capsule (40 mg total) by mouth daily. 08/28/19  Yes Unk Lightning, PA  oxyCODONE-acetaminophen (PERCOCET/ROXICET) 5-325 MG tablet Take 1 tablet by mouth every 6 (six) hours as needed (pain).  02/06/19  Yes [provider]  rosuvastatin (CRESTOR) 10 MG tablet Take 10 mg by mouth at bedtime.    Yes [provider]  TYMLOS 3120 MCG/1.56ML SOPN Inject 80 mcg into the skin daily. 02/28/19  Yes [provider]    Allergies    Codeine and Erythromycin  Review of Systems   Review of Systems  Constitutional: Negative for chills and fever.  Eyes: Negative for photophobia and visual disturbance.  Respiratory:  Negative for shortness of breath.   Cardiovascular: Negative for chest pain.  Gastrointestinal: Negative for abdominal pain, nausea and vomiting.  Musculoskeletal: Positive for arthralgias.  Skin: Positive for wound.  Neurological: Negative for syncope, weakness, light-headedness, numbness and headaches.  Hematological: Bruises/bleeds easily.  All other systems reviewed and are negative.   Physical Exam Updated Vital Signs BP (!) 145/86   Pulse 81   Temp 98.8 F (37.1 C) (Oral)   Resp 16   Ht 5\' 3"  (1.6 m)   Wt 56.7 kg   SpO2 91%   BMI 22.14 kg/m   Physical Exam Vitals and nursing note reviewed.  Constitutional:      General: She is not in acute distress.    Appearance: She is well-developed.  HENT:     Head: Normocephalic and atraumatic.     Comments:  No Battle's signs, no raccoon's eyes, no rhinorrhea. No hemotympanum. No tenderness to palpation of the face or skull. No deformity, crepitus, or swelling noted.  Eyes:     General:        Right eye: No discharge.        Left eye: No discharge.     Extraocular Movements: Extraocular movements intact.     Conjunctiva/sclera: Conjunctivae normal.     Pupils: Pupils are equal, round, and reactive to light.  Neck:     Vascular: No JVD.     Trachea: No tracheal deviation.  Cardiovascular:     Rate and Rhythm: Normal rate. Rhythm irregular.     Pulses: Normal pulses.     Comments: 2+ radial and DP/PT pulses bilaterally, Homans sign absent bilaterally, no lower extremity edema, no palpable cords, compartments are soft  Pulmonary:     Effort: Pulmonary effort is normal.     Breath sounds: Normal breath sounds.  Abdominal:     General: Bowel sounds are normal. There is no distension.     Palpations: Abdomen is soft.     Tenderness: There is no abdominal tenderness. There is no guarding or rebound.  Musculoskeletal:     Cervical back: Normal range of motion and neck supple.     Comments: See below images.  Patient with  large V-shaped laceration of the left lower leg anteriorly.  One edge measures 9 cm, the other 8 cm.  Bleeding is controlled.  Underlying fascia is visible but no bony tenderness.  There is surrounding ecchymosis.  Mild ecchymosis and tenderness to palpation of the left great toe.  No ligamentous laxity or varus or valgus instability noted on examination of the left ankle.  5/5 strength of BUE and BLE major muscle groups.  No midline spine tenderness.  No deformity, crepitus or step-off noted.  Skin:    General: Skin is warm and dry.     Findings: No erythema.  Neurological:     General: No focal deficit present.     Mental Status: She is alert and oriented to person, place, and time.     Sensory: No sensory deficit.     Motor: No weakness.     Comments: Mental Status:  Alert, thought content appropriate, able to give a coherent history. Speech fluent without evidence of aphasia. Able to follow 2 step commands without difficulty.  Cranial Nerves: Appear grossly intact Motor:  Normal tone. 5/5 strength of BUE and BLE major muscle groups including strong and equal grip strength and dorsiflexion/plantar flexion Sensory: light touch normal in all extremities.  Psychiatric:        Behavior: Behavior normal.         ED Results / Procedures / Treatments   Labs (all labs ordered are listed, but only abnormal results are displayed) Labs Reviewed - No data to display  EKG None  Radiology DG Tibia/Fibula Left  Result Date: 11/09/2019 CLINICAL DATA:  Fall down steps, injury to LEFT lower leg. EXAM: LEFT TIBIA AND FIBULA - 2 VIEW COMPARISON:  None. FINDINGS: There is no evidence of fracture or other focal bone lesions. Presumed soft tissue edema/laceration overlying the lower RIGHT tibia with overlying bandages in place. No underlying fracture. IMPRESSION: No osseous fracture or dislocation. Electronically Signed   By: Bary Richard M.D.   On: 11/09/2019 12:20   DG Foot Complete  Left  Result Date: 11/09/2019 CLINICAL DATA:  Fall, laceration, great toe pain. EXAM: LEFT FOOT - COMPLETE 3+  VIEW COMPARISON:  None. FINDINGS: Slightly displaced fracture at the base of the RIGHT first distal phalanx, only appreciable on the lateral view, with probable extension to the articular surface at the PIP joint. Remainder of the osseous structures of the RIGHT foot appear intact and normally aligned. Fixation screw within the fifth metatarsal head appears intact and appropriately positioned. IMPRESSION: Slightly displaced fracture at the base of the RIGHT first distal phalanx (great toe), with probable extension to the articular surface at the PIP joint, only visible on the lateral projection. Electronically Signed   By: Bary Richard M.D.   On: 11/09/2019 12:22    Procedures .Marland KitchenLaceration Repair  Date/Time: 11/09/2019 2:05 PM Performed by: Jeanie Sewer, PA-C Authorized by: Jeanie Sewer, PA-C   Consent:    Consent obtained:  Verbal   Consent given by:  Patient   Risks discussed:  Infection, need for additional repair, pain, poor cosmetic result and poor wound healing   Alternatives discussed:  No treatment and delayed treatment Universal protocol:    Procedure explained and questions answered to patient or proxy's satisfaction: yes     Relevant documents present and verified: yes     Test results available and properly labeled: yes     Imaging studies available: yes     Required blood products, implants, devices, and special equipment available: yes     Site/side marked: yes     Immediately prior to procedure, a time out was called: yes     Patient identity confirmed:  Verbally with patient Anesthesia (see MAR for exact dosages):    Anesthesia method:  Local infiltration   Local anesthetic:  Lidocaine 2% WITH epi Laceration details:    Location:  Leg   Leg location:  L lower leg   Length (cm):  17   Depth (mm):  3 Repair type:    Repair type:  Intermediate Pre-procedure  details:    Preparation:  Patient was prepped and draped in usual sterile fashion and imaging obtained to evaluate for foreign bodies Exploration:    Hemostasis achieved with:  Direct pressure   Wound exploration: wound explored through full range of motion     Wound extent: areolar tissue violated and foreign bodies/material     Wound extent: no underlying fracture noted     Foreign bodies/material:  Piece of string Treatment:    Area cleansed with:  Betadine and saline   Amount of cleaning:  Extensive   Irrigation solution:  Sterile saline   Irrigation method:  Pressure wash   Visualized foreign bodies/material removed: yes   Skin repair:    Repair method:  Sutures and Steri-Strips (and 2 derma-clips)   Suture size:  4-0   Suture material:  Prolene   Suture technique:  Simple interrupted   Number of sutures:  16   Number of Steri-Strips:  3 Approximation:    Approximation:  Close Post-procedure details:    Dressing:  Non-adherent dressing and splint for protection   Patient tolerance of procedure:  Tolerated well, no immediate complications   (including critical care time)  Medications Ordered in ED Medications  lidocaine-EPINEPHrine (XYLOCAINE W/EPI) 2 %-1:200000 (PF) injection 20 mL (has no administration in time range)    ED Course  I have reviewed the triage vital signs and the nursing notes.  Pertinent labs & imaging results that were available during my care of the patient were reviewed by me and considered in my medical decision making (see chart for details).  MDM Rules/Calculators/A&P                      Patient presents for evaluation of left lower extremity injury secondary to mechanical fall.  She is afebrile, found to be in A. fib but heart rate within normal limits and vital signs are otherwise stable.  She is nontoxic in appearance.  She is anticoagulated on Eliquis due to her history of A. fib, reports compliance with this medication.  No head injury or  loss of consciousness.  No headaches.  No evidence of serious head injury.  No midline spine tenderness.  Examination of the pelvis is stable.  Atraumatic examination of the chest and abdomen/pelvis.  Radiographs show a slightly displaced fracture at the base of the right first distal phalanx with probable extension into the articular surface at the PIP joint.  Her toes were buddy taped and she was placed in a postop shoe and is able to ambulate without difficulty in the ED. Pressure irrigation performed. Wound explored and base of wound visualized in a bloodless field without evidence of foreign body.  Laceration occurred < 8 hours prior to repair which was well tolerated.  Her laceration was repaired with 16 nonabsorbable sutures, 2 derma clips, and 3 Steri-Strips.  A nonadherent dressing was applied.  Her tetanus is up-to-date.  She was discharged without antibiotics.  Discussed home wound care and conservative therapy and management of her toe fracture with Tylenol, elevation and ice and follow-up with orthopedist.  She will return in 10 days for suture removal or go to her PCP or urgent care.  Discussed strict ED return precautions. Patient verbalized understanding of and agreement with plan and is safe for discharge home at this time.  Final Clinical Impression(s) / ED Diagnoses Final diagnoses:  Fall, initial encounter  Laceration of left lower leg, initial encounter  Nondisplaced fracture of distal phalanx of left great toe, initial encounter for closed fracture    Rx / DC Orders ED Discharge Orders    None       Jeanie Sewer, PA-C 11/09/19 1423    Cathren Laine, MD 11/09/19 1515

## 2019-11-09 NOTE — ED Triage Notes (Signed)
Patient arrived by POV from home. Slipped down steps striking left lower leg. Arrived with pressure dressing to same. denies striking head, degloving/ skin tear to left lower leg, no bleeding, positive distal pulse

## 2019-11-09 NOTE — ED Provider Notes (Signed)
[name]    CSN: [id number] Arrival date & time: 11/09/19  1001      History   Chief Complaint Chief Complaint  Patient presents with  . Laceration    HPI Laurie Pugh is a 86 y.o. female significant medical history as outlined below on Eliquis presenting w/ her daughter for left leg laceration after slipping on steps this morning.  Daughter denies head trauma, LOC, AMS.  Patient denies chest pain, difficulty breathing.  Son-in-law applied pressure wound dressing PTA to help with prolonged bleeding.  Patient endorsing pain: Denies numbness, tingling distal extremity.  Past Medical History:  Diagnosis Date  . Allergy   . Angina pectoris, unstable ([place])   . Atrial fibrillation ([place])   . Cataract   . Dyslipidemia   . Dysrhythmia   . Esophageal stricture   . GERD (gastroesophageal reflux disease)   . Hyperlipidemia   . Hypertension   . Neck pain   . PAF (paroxysmal atrial fibrillation) ([place])   . PSVT (paroxysmal supraventricular tachycardia) [facility])     Patient Active Problem List   Diagnosis Date Noted  . Stenosis of carotid artery 02/12/2018  . 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 Chronic anticoagulation 05/21/2017  . Esophageal stricture 01/30/2017  . Esophageal dysphagia   . Esophageal obstruction due to food impaction   . Sinus bradycardia 05/26/2014  . PVC's (premature ventricular contractions) 05/26/2014  . Paroxysmal SVT (supraventricular tachycardia) ([place]) 07/16/2013  . Atrial fibrillation ([place]) 06/24/2013  . Mixed hyperlipidemia 06/24/2013  . Essential hypertension 06/24/2013  . Angina pectoris, unstable ([place]) 06/24/2013  . Neck pain 06/24/2013  . DOE (dyspnea on exertion) 06/24/2013    Past Surgical History:  Procedure Laterality Date  . ABDOMINAL HYSTERECTOMY    . APPENDECTOMY    . BLADDER SURGERY     bladder surgery  . BREAST EXCISIONAL BIOPSY    . CARDIAC CATHETERIZATION  06/28/2004   no significant CAD (Dr. Gerrie Nordmann)  .  CATARACT EXTRACTION, BILATERAL  2008  . CYSTOSCOPY W/ RETROGRADES Bilateral 04/11/2016   Procedure: CYSTOSCOPY WITH BILATERAL RETROGRADE PYELOGRAM;  Surgeon: Nickie Retort, MD;  Location: [facility];  Service: Urology;  Laterality: Bilateral;  . ESOPHAGOGASTRODUODENOSCOPY N/A 01/30/2017   Procedure: ESOPHAGOGASTRODUODENOSCOPY (EGD);  Surgeon: Irene Shipper, MD;  Location: [facility];  Service: Endoscopy;  Laterality: N/A;  . ESOPHAGOGASTRODUODENOSCOPY N/A 01/30/2017   Procedure: ESOPHAGOGASTRODUODENOSCOPY (EGD);  Surgeon: Mauri Pole, MD;  Location: [facility];  Service: Endoscopy;  Laterality: N/A;  . IR VERTEBROPLASTY LUMBAR BX INC UNI/BIL INC/INJECT/IMAGING  03/15/2019  . TRANSTHORACIC ECHOCARDIOGRAM  2009   borderline conc LVH; trace MR; mild-mod TR, RVSP 30-51mmHg    OB History   No obstetric history on file.      Home Medications    Prior to Admission medications   Medication Sig Start Date End Date Taking? Authorizing Provider  apixaban (ELIQUIS) 5 MG TABS tablet Take 2.5 mg by mouth 2 (two) times daily.    [provider]  atenolol (TENORMIN) 25 MG tablet TAKE 1 TABLET BY MOUTH  DAILY 05/20/19   Hilty, Nadean Corwin, MD  benazepril (LOTENSIN) 20 MG tablet Take 20 mg by mouth daily.  05/30/13   [provider]  chlorthalidone (HYGROTON) 25 MG tablet Take 12.5-25 mg by mouth daily as needed (for SBP >140).    [provider]  gabapentin (NEURONTIN) 100 MG capsule Take 100 mg by mouth 3 (three) times daily.  03/21/17   [provider]  LORazepam (  ATIVAN) 1 MG tablet Take 0.5-1 mg by mouth 2 (two) times daily as needed for anxiety.  05/30/13   [provider]  Multiple Vitamins-Minerals (MULTIVITAMIN WITH MINERALS) tablet Take 1 tablet by mouth daily.    [provider]  omeprazole (PRILOSEC) 40 MG capsule Take 1 capsule (40 mg total) by mouth daily. 08/28/19   Unk Lightning, PA  oxyCODONE-acetaminophen (PERCOCET/ROXICET)  5-325 MG tablet Take 1 tablet by mouth every 6 (six) hours as needed (pain).  02/06/19   [provider]  rosuvastatin (CRESTOR) 10 MG tablet Take 10 mg by mouth at bedtime.     [provider]  TYMLOS 3120 MCG/1.56ML SOPN Inject 80 mcg into the skin daily. 02/28/19   [provider]    Family History Family History  Problem Relation Age of Onset  . Heart disease Mother   . Heart disease Sister   . Suicidality Brother   . Cancer Sister   . COPD Sister   . Suicidality Child   . Colon cancer Neg Hx   . Colon polyps Neg Hx   . Esophageal cancer Neg Hx   . Rectal cancer Neg Hx   . Stomach cancer Neg Hx     Social History Social History   Tobacco Use  . Smoking status: Former Smoker    Packs/day: 1.00    Years: 40.00    Pack years: 40.00    Quit date: 06/25/2003    Years since quitting: 16.3  . Smokeless tobacco: Never Used  Substance Use Topics  . Alcohol use: No  . Drug use: No     Allergies   Codeine and Erythromycin   Review of Systems As per HPI   Physical Exam Triage Vital Signs ED Triage Vitals  Enc Vitals Group     BP      Pulse      Resp      Temp      Temp src      SpO2      Weight      Height      Head Circumference      Peak Flow      Pain Score      Pain Loc      Pain Edu?      Excl. in GC?    No data found.  Updated Vital Signs BP (!) 157/82 (BP Location: Left Arm)   Pulse 78   Temp 97.9 F (36.6 C) (Temporal)   Resp 16   Visual Acuity Right Eye Distance:   Left Eye Distance:   Bilateral Distance:    Right Eye Near:   Left Eye Near:    Bilateral Near:     Physical Exam Constitutional:      General: She is not in acute distress.    Appearance: She is not ill-appearing.  HENT:     Head: Normocephalic and atraumatic.  Eyes:     General: No scleral icterus.    Pupils: Pupils are equal, round, and reactive to light.  Cardiovascular:     Rate and Rhythm: Normal rate.     Comments: DP pulses 2+  bilaterally Pulmonary:     Effort: Pulmonary effort is normal.  Musculoskeletal:        General: Tenderness and signs of injury present. Normal range of motion.     Comments: ROM intact of affected knee, ankle  Skin:    Coloration: Skin is not jaundiced or pale.  Comments: Large V-shaped laceration to left shin.  Distal flap cyanotic, cool to touch, mildly edematous.  Fascia visible/TTP.  Mild oozing of blood at medial aspect of wound.  No foreign body or bone visualized throughout woundSensation intact  Neurological:     Mental Status: She is alert and oriented to person, place, and time.      UC Treatments / Results  Labs (all labs ordered are listed, but only abnormal results are displayed) Labs Reviewed - No data to display  EKG   Radiology No results found.  Procedures Procedures (including critical care time)  Medications Ordered in UC Medications - No data to display  Initial Impression / Assessment and Plan / UC Course  I have reviewed the triage vital signs and the nursing notes.  Pertinent labs & imaging results that were available during my care of the patient were reviewed by me and considered in my medical decision making (see chart for details).    Patient afebrile, nontoxic in office today.  Fingertips are cold due to weather outside, unable to pick up O2 in office.  Patient denying difficulty breathing, wheezing, chest pain.  No tachypnea, JVD in office.  No reported head trauma or LOC from fall.  Patient does have significant laceration with visible fascia and tenderness.  Most concerning is cyanotic and cold appearance to distal aspect of laceration.  No active bleeding despite chronic Eliquis use.  Pressure wound dressing applied in office: patient tolerated well.  Advised patient go to ER for more thorough evaluation of significant lower extremity wound in medically complicated, geriatric female on chronic Eliquis.  Patient and daughter agreeable to plan,  will self transport to ED.   Final Clinical Impressions(s) / UC Diagnoses   Final diagnoses:  Fall, initial encounter  Laceration of left lower extremity, initial encounter  Blood thinned due to long-term anticoagulant use     Discharge Instructions     Recommend he go to ER for further evaluation of your wound.    ED Prescriptions    None     PDMP not reviewed this encounter.   Hall[place], [place], [place] 11/09/19 1140

## 2019-11-09 NOTE — Progress Notes (Signed)
Orthopedic Tech Progress Note Patient Details:  Laurie Pugh [birth year] [id number] Level 2 trauma Patient ID: Laurie Pugh, female   DOB: 11-18-1931, 86 y.o.   MRN: [id number]   Laurie Pugh 11/09/2019, 11:34 AM

## 2019-11-09 NOTE — ED Triage Notes (Signed)
Pt presents to [facility] for assessment after slipping and falling coming down her front stoop/steps.  Large laceration/skin tear noted to left shin.

## 2020-01-31 ENCOUNTER — Other Ambulatory Visit: Payer: Self-pay | Admitting: Physician Assistant

## 2020-02-16 ENCOUNTER — Other Ambulatory Visit: Payer: Self-pay | Admitting: Internal Medicine

## 2020-03-10 ENCOUNTER — Encounter (HOSPITAL_COMMUNITY): Payer: Medicare Other

## 2020-04-06 ENCOUNTER — Other Ambulatory Visit: Payer: Self-pay | Admitting: Internal Medicine

## 2020-04-06 DIAGNOSIS — Z1231 Encounter for screening mammogram for malignant neoplasm of breast: Secondary | ICD-10-CM

## 2020-04-13 ENCOUNTER — Ambulatory Visit
Admission: RE | Admit: 2020-04-13 | Discharge: 2020-04-13 | Disposition: A | Discharge status: Home / Self Care | Payer: Medicare Other | Source: Ambulatory Visit | Attending: Internal Medicine | Admitting: Internal Medicine

## 2020-04-13 ENCOUNTER — Other Ambulatory Visit: Payer: Self-pay

## 2020-04-13 DIAGNOSIS — Z1231 Encounter for screening mammogram for malignant neoplasm of breast: Secondary | ICD-10-CM

## 2021-01-05 ENCOUNTER — Ambulatory Visit (HOSPITAL_COMMUNITY)
Admission: RE | Admit: 2021-01-05 | Discharge: 2021-01-05 | Disposition: A | Discharge status: Home / Self Care | Payer: Medicare Other | Source: Ambulatory Visit | Attending: Internal Medicine | Admitting: Internal Medicine

## 2021-01-05 ENCOUNTER — Other Ambulatory Visit: Payer: Self-pay

## 2021-01-05 ENCOUNTER — Other Ambulatory Visit (HOSPITAL_COMMUNITY): Payer: Self-pay | Admitting: Internal Medicine

## 2021-01-05 DIAGNOSIS — M7989 Other specified soft tissue disorders: Secondary | ICD-10-CM

## 2021-01-05 DIAGNOSIS — M79661 Pain in right lower leg: Secondary | ICD-10-CM | POA: Insufficient documentation

## 2021-01-14 ENCOUNTER — Other Ambulatory Visit: Payer: Self-pay | Admitting: Physician Assistant

## 2021-01-22 ENCOUNTER — Other Ambulatory Visit (HOSPITAL_COMMUNITY): Payer: Self-pay | Admitting: *Deleted

## 2021-01-22 NOTE — Discharge Instructions (Signed)
Denosumab injection What is this medicine? DENOSUMAB (den oh sue mab) slows bone breakdown. Prolia is used to treat osteoporosis in women after menopause and in men, and in people who are taking corticosteroids for 6 months or more. Xgeva is used to treat a high calcium level due to cancer and to prevent bone fractures and other bone problems caused by multiple myeloma or cancer bone metastases. Xgeva is also used to treat giant cell tumor of the bone. This medicine may be used for other purposes; ask your health care provider or pharmacist if you have questions. COMMON BRAND NAME(S): Prolia, XGEVA What should I tell my health care provider before I take this medicine? They need to know if you have any of these conditions:  dental disease  having surgery or tooth extraction  infection  kidney disease  low levels of calcium or Vitamin D in the blood  malnutrition  on hemodialysis  skin conditions or sensitivity  thyroid or parathyroid disease  an unusual reaction to denosumab, other medicines, foods, dyes, or preservatives  pregnant or trying to get pregnant  breast-feeding How should I use this medicine? This medicine is for injection under the skin. It is given by a health care professional in a hospital or clinic setting. A special MedGuide will be given to you before each treatment. Be sure to read this information carefully each time. For Prolia, talk to your pediatrician regarding the use of this medicine in children. Special care may be needed. For Xgeva, talk to your pediatrician regarding the use of this medicine in children. While this drug may be prescribed for children as young as 13 years for selected conditions, precautions do apply. Overdosage: If you think you have taken too much of this medicine contact a poison control center or emergency room at once. NOTE: This medicine is only for you. Do not share this medicine with others. What if I miss a dose? It is  important not to miss your dose. Call your doctor or health care professional if you are unable to keep an appointment. What may interact with this medicine? Do not take this medicine with any of the following medications:  other medicines containing denosumab This medicine may also interact with the following medications:  medicines that lower your chance of fighting infection  steroid medicines like prednisone or cortisone This list may not describe all possible interactions. Give your health care provider a list of all the medicines, herbs, non-prescription drugs, or dietary supplements you use. Also tell them if you smoke, drink alcohol, or use illegal drugs. Some items may interact with your medicine. What should I watch for while using this medicine? Visit your doctor or health care professional for regular checks on your progress. Your doctor or health care professional may order blood tests and other tests to see how you are doing. Call your doctor or health care professional for advice if you get a fever, chills or sore throat, or other symptoms of a cold or flu. Do not treat yourself. This drug may decrease your body's ability to fight infection. Try to avoid being around people who are sick. You should make sure you get enough calcium and vitamin D while you are taking this medicine, unless your doctor tells you not to. Discuss the foods you eat and the vitamins you take with your health care professional. See your dentist regularly. Brush and floss your teeth as directed. Before you have any dental work done, tell your dentist you are   receiving this medicine. Do not become pregnant while taking this medicine or for 5 months after stopping it. Talk with your doctor or health care professional about your birth control options while taking this medicine. Women should inform their doctor if they wish to become pregnant or think they might be pregnant. There is a potential for serious side  effects to an unborn child. Talk to your health care professional or pharmacist for more information. What side effects may I notice from receiving this medicine? Side effects that you should report to your doctor or health care professional as soon as possible:  allergic reactions like skin rash, itching or hives, swelling of the face, lips, or tongue  bone pain  breathing problems  dizziness  jaw pain, especially after dental work  redness, blistering, peeling of the skin  signs and symptoms of infection like fever or chills; cough; sore throat; pain or trouble passing urine  signs of low calcium like fast heartbeat, muscle cramps or muscle pain; pain, tingling, numbness in the hands or feet; seizures  unusual bleeding or bruising  unusually weak or tired Side effects that usually do not require medical attention (report to your doctor or health care professional if they continue or are bothersome):  constipation  diarrhea  headache  joint pain  loss of appetite  muscle pain  runny nose  tiredness  upset stomach This list may not describe all possible side effects. Call your doctor for medical advice about side effects. You may report side effects to FDA at 1-800-FDA-1088. Where should I keep my medicine? This medicine is only given in a clinic, doctor's office, or other health care setting and will not be stored at home. NOTE: This sheet is a summary. It may not cover all possible information. If you have questions about this medicine, talk to your doctor, pharmacist, or health care provider.  2021 Elsevier/Gold Standard (2018-01-19 16:10:44)

## 2021-01-25 ENCOUNTER — Encounter (HOSPITAL_COMMUNITY): Payer: Self-pay

## 2021-01-25 ENCOUNTER — Inpatient Hospital Stay (HOSPITAL_COMMUNITY)
Admission: RE | Admit: 2021-01-25 | Discharge: 2021-01-25 | Disposition: A | Discharge status: Home / Self Care | Payer: Medicare Other | Source: Ambulatory Visit | Attending: Internal Medicine | Admitting: Internal Medicine

## 2021-03-01 ENCOUNTER — Other Ambulatory Visit: Payer: Self-pay | Admitting: Internal Medicine

## 2021-03-15 IMAGING — CR LUMBAR SPINE - 2-3 VIEW
2 series · 2 of 2 positions shown · non-contrast
Comparison: CT 04/08/2016

CLINICAL DATA: Fall 4 weeks ago.  Low back pain.

EXAM:
LUMBAR SPINE - 2-3 VIEW

[t l-spine a.p.]
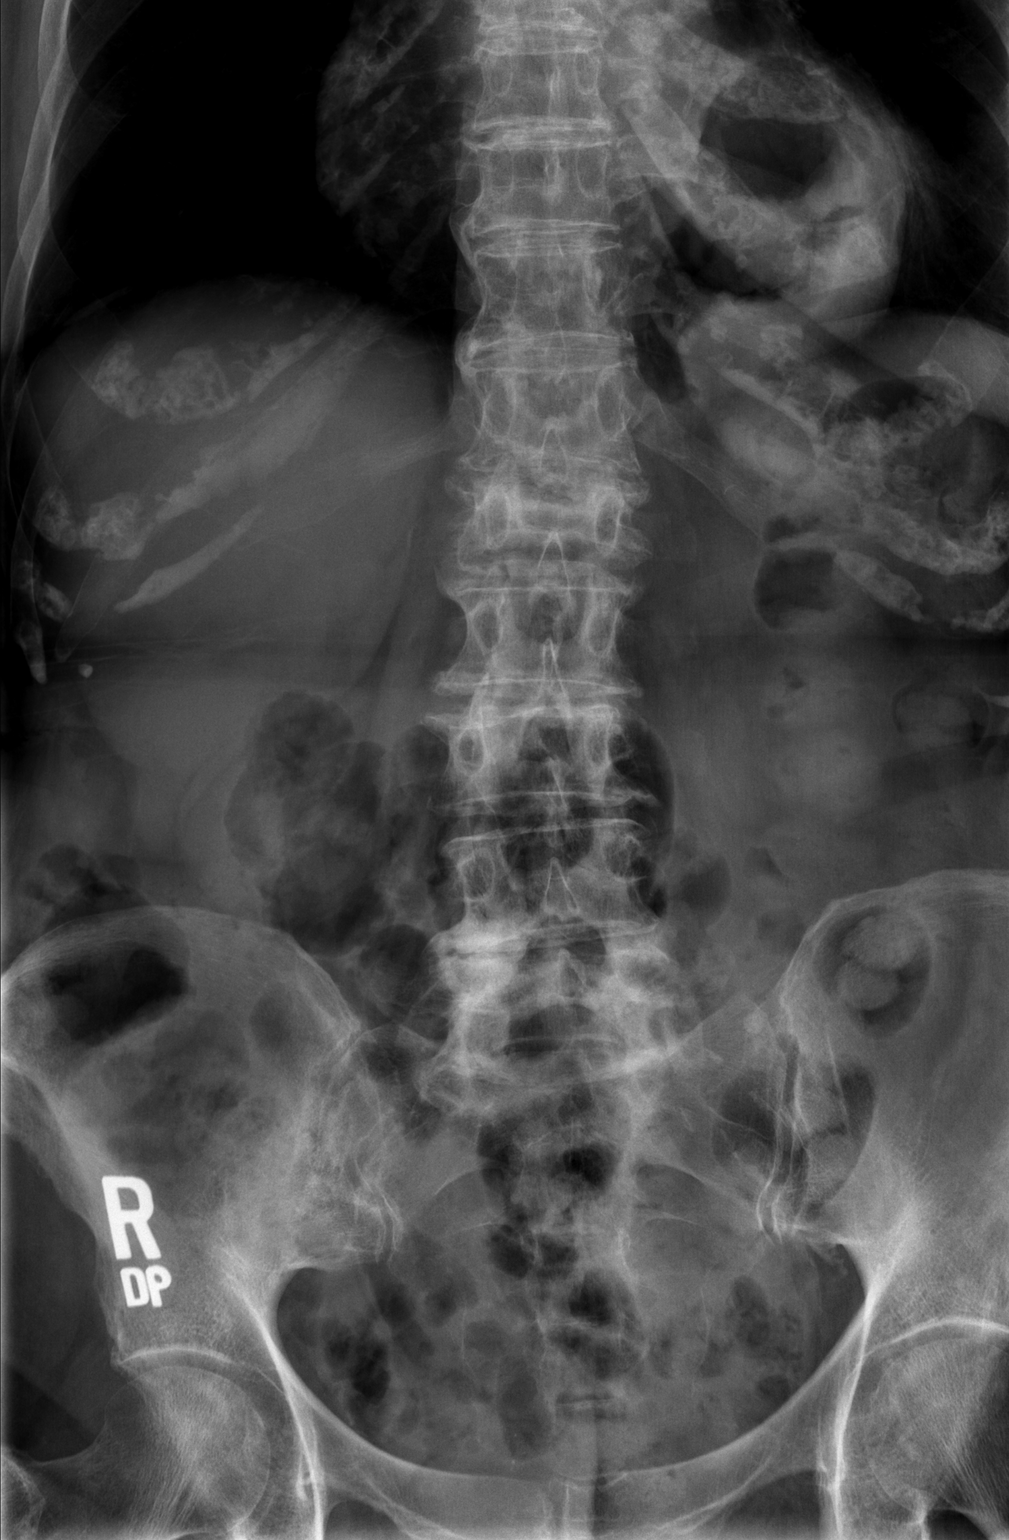

[t l-spine lat]
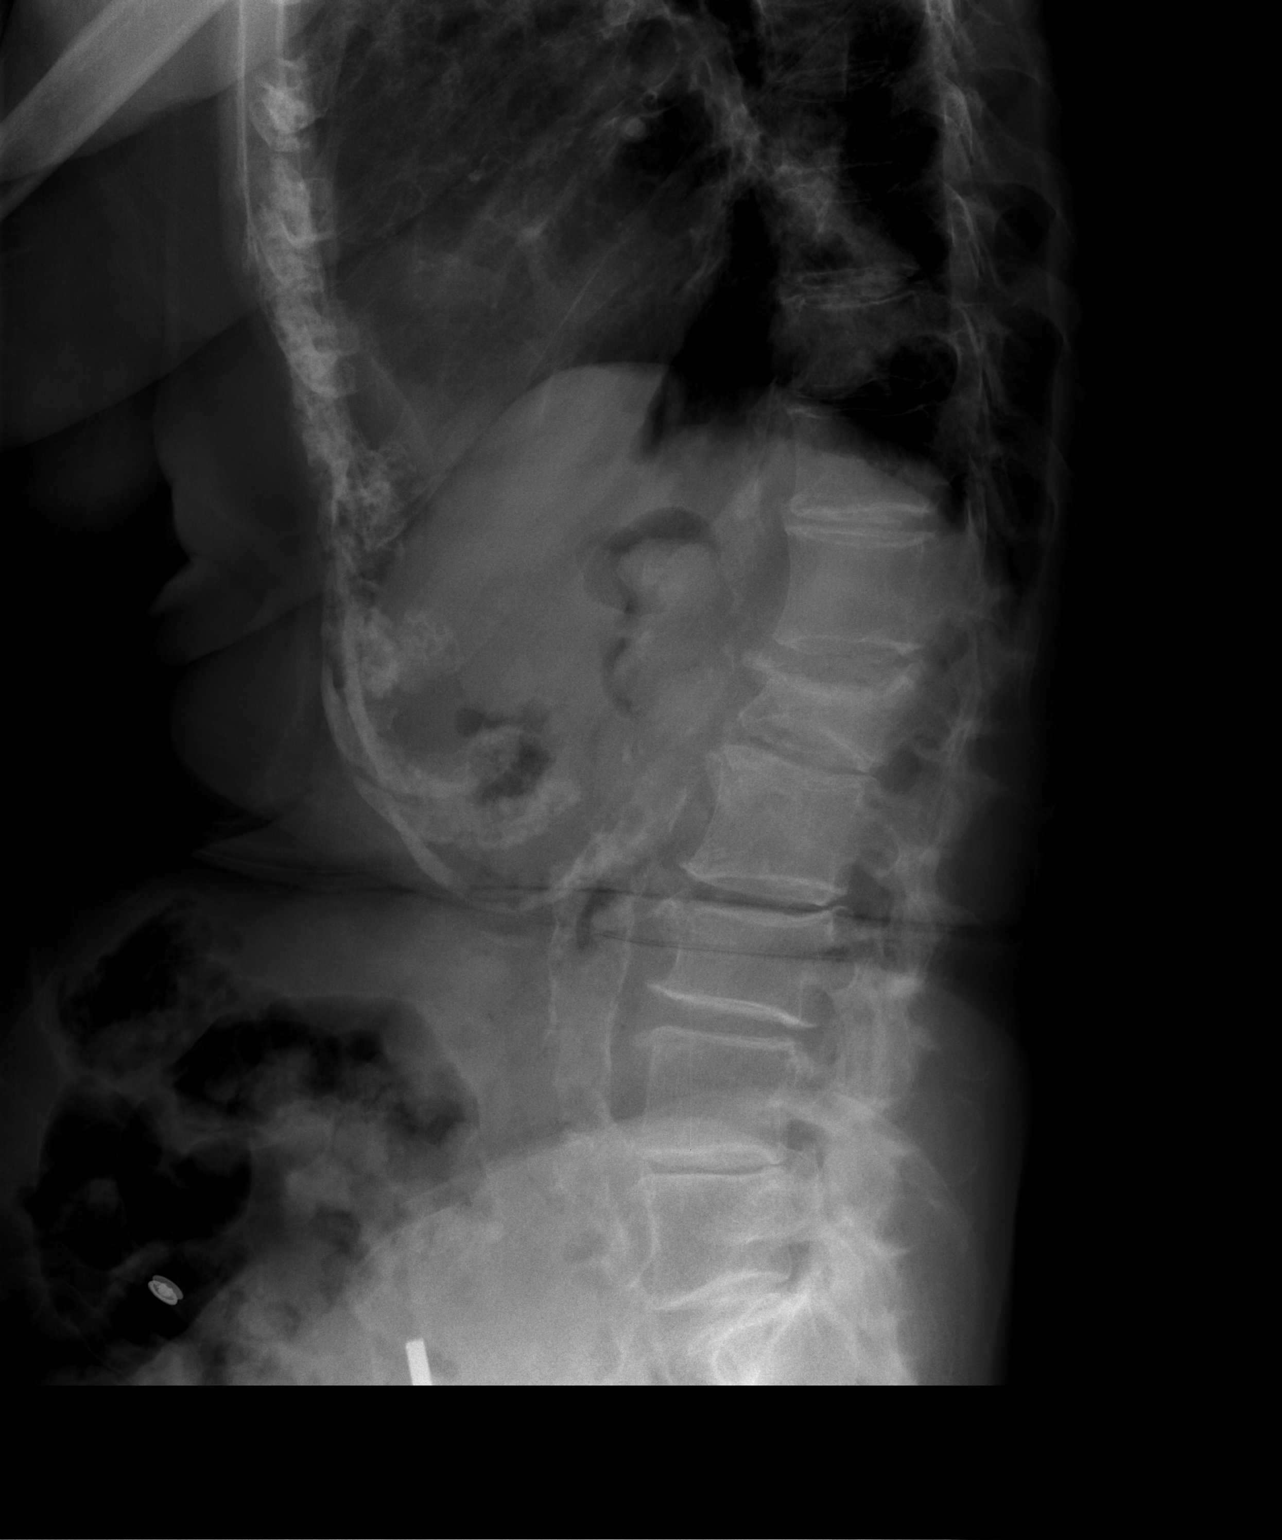

[2 of 2 positions shown; findings below may reference images not displayed]

FINDINGS: Normal alignment of the lumbar spine. There is a new compression
deformity and fracture at L1. Compression fracture along the
superior endplate of L1. Approximately 50% loss of vertebral body
height at L1. There is probably a component of bone retropulsion at
the L1 fracture. Normal alignment at the thoracolumbar junction.
Mild vertebral body height loss at L3 appears chronic. There is
chronic disc space narrowing at L4-L5. Atherosclerotic
calcifications in the aorta.
IMPRESSION: Compression fracture of the L1 vertebral body. Approximately 50%
loss of vertebral body height. This fracture is new since 7431 and
could be acute.

Chronic mild compression deformity at L3.

Stable disc space disease and narrowing at L4-L5.

## 2021-03-20 ENCOUNTER — Encounter (HOSPITAL_COMMUNITY): Payer: Self-pay

## 2021-03-20 ENCOUNTER — Emergency Department (HOSPITAL_COMMUNITY): Payer: Medicare Other

## 2021-03-20 ENCOUNTER — Inpatient Hospital Stay (HOSPITAL_COMMUNITY)
Admission: EM | Admit: 2021-03-20 | Discharge: 2021-03-29 | DRG: 871 | Disposition: A | Discharge status: Skilled Nursing Facility | Payer: Medicare Other | Attending: Internal Medicine | Admitting: Internal Medicine

## 2021-03-20 ENCOUNTER — Other Ambulatory Visit: Payer: Self-pay

## 2021-03-20 DIAGNOSIS — Z885 Allergy status to narcotic agent status: Secondary | ICD-10-CM

## 2021-03-20 DIAGNOSIS — I5033 Acute on chronic diastolic (congestive) heart failure: Secondary | ICD-10-CM | POA: Diagnosis present

## 2021-03-20 DIAGNOSIS — M7989 Other specified soft tissue disorders: Secondary | ICD-10-CM | POA: Diagnosis not present

## 2021-03-20 DIAGNOSIS — Z87891 Personal history of nicotine dependence: Secondary | ICD-10-CM

## 2021-03-20 DIAGNOSIS — I351 Nonrheumatic aortic (valve) insufficiency: Secondary | ICD-10-CM | POA: Diagnosis not present

## 2021-03-20 DIAGNOSIS — Z9841 Cataract extraction status, right eye: Secondary | ICD-10-CM | POA: Diagnosis not present

## 2021-03-20 DIAGNOSIS — Z8249 Family history of ischemic heart disease and other diseases of the circulatory system: Secondary | ICD-10-CM

## 2021-03-20 DIAGNOSIS — J9601 Acute respiratory failure with hypoxia: Secondary | ICD-10-CM | POA: Diagnosis present

## 2021-03-20 DIAGNOSIS — R6520 Severe sepsis without septic shock: Secondary | ICD-10-CM | POA: Diagnosis present

## 2021-03-20 DIAGNOSIS — Z881 Allergy status to other antibiotic agents status: Secondary | ICD-10-CM | POA: Diagnosis not present

## 2021-03-20 DIAGNOSIS — A419 Sepsis, unspecified organism: Principal | ICD-10-CM | POA: Diagnosis present

## 2021-03-20 DIAGNOSIS — K8020 Calculus of gallbladder without cholecystitis without obstruction: Secondary | ICD-10-CM

## 2021-03-20 DIAGNOSIS — Z90710 Acquired absence of both cervix and uterus: Secondary | ICD-10-CM | POA: Diagnosis not present

## 2021-03-20 DIAGNOSIS — J189 Pneumonia, unspecified organism: Secondary | ICD-10-CM | POA: Diagnosis present

## 2021-03-20 DIAGNOSIS — E876 Hypokalemia: Secondary | ICD-10-CM | POA: Diagnosis not present

## 2021-03-20 DIAGNOSIS — J810 Acute pulmonary edema: Secondary | ICD-10-CM

## 2021-03-20 DIAGNOSIS — R410 Disorientation, unspecified: Secondary | ICD-10-CM

## 2021-03-20 DIAGNOSIS — Z9842 Cataract extraction status, left eye: Secondary | ICD-10-CM | POA: Diagnosis not present

## 2021-03-20 DIAGNOSIS — I2510 Atherosclerotic heart disease of native coronary artery without angina pectoris: Secondary | ICD-10-CM | POA: Diagnosis present

## 2021-03-20 DIAGNOSIS — K222 Esophageal obstruction: Secondary | ICD-10-CM

## 2021-03-20 DIAGNOSIS — Z20822 Contact with and (suspected) exposure to covid-19: Secondary | ICD-10-CM | POA: Diagnosis present

## 2021-03-20 DIAGNOSIS — Z7901 Long term (current) use of anticoagulants: Secondary | ICD-10-CM

## 2021-03-20 DIAGNOSIS — G894 Chronic pain syndrome: Secondary | ICD-10-CM | POA: Diagnosis present

## 2021-03-20 DIAGNOSIS — E785 Hyperlipidemia, unspecified: Secondary | ICD-10-CM | POA: Diagnosis present

## 2021-03-20 DIAGNOSIS — I480 Paroxysmal atrial fibrillation: Secondary | ICD-10-CM

## 2021-03-20 DIAGNOSIS — L89151 Pressure ulcer of sacral region, stage 1: Secondary | ICD-10-CM | POA: Diagnosis present

## 2021-03-20 DIAGNOSIS — Z961 Presence of intraocular lens: Secondary | ICD-10-CM | POA: Diagnosis present

## 2021-03-20 DIAGNOSIS — K219 Gastro-esophageal reflux disease without esophagitis: Secondary | ICD-10-CM | POA: Diagnosis present

## 2021-03-20 DIAGNOSIS — R0902 Hypoxemia: Secondary | ICD-10-CM

## 2021-03-20 DIAGNOSIS — I110 Hypertensive heart disease with heart failure: Secondary | ICD-10-CM | POA: Diagnosis present

## 2021-03-20 DIAGNOSIS — I10 Essential (primary) hypertension: Secondary | ICD-10-CM | POA: Diagnosis present

## 2021-03-20 DIAGNOSIS — Z9049 Acquired absence of other specified parts of digestive tract: Secondary | ICD-10-CM | POA: Diagnosis not present

## 2021-03-20 DIAGNOSIS — L89301 Pressure ulcer of unspecified buttock, stage 1: Secondary | ICD-10-CM | POA: Diagnosis not present

## 2021-03-20 DIAGNOSIS — G9341 Metabolic encephalopathy: Secondary | ICD-10-CM | POA: Diagnosis present

## 2021-03-20 DIAGNOSIS — I361 Nonrheumatic tricuspid (valve) insufficiency: Secondary | ICD-10-CM | POA: Diagnosis not present

## 2021-03-20 DIAGNOSIS — L8990 Pressure ulcer of unspecified site, unspecified stage: Secondary | ICD-10-CM | POA: Insufficient documentation

## 2021-03-20 DIAGNOSIS — I4891 Unspecified atrial fibrillation: Secondary | ICD-10-CM | POA: Diagnosis present

## 2021-03-20 LAB — CBC WITH DIFFERENTIAL/PLATELET
Abs Immature Granulocytes: 0.16 10*3/uL — ABNORMAL HIGH (ref 0.00–0.07)
Basophils Absolute: 0.1 10*3/uL (ref 0.0–0.1)
Basophils Relative: 0 %
Eosinophils Absolute: 0 10*3/uL (ref 0.0–0.5)
Eosinophils Relative: 0 %
HCT: 42.6 % (ref 36.0–46.0)
Hemoglobin: 13.6 g/dL (ref 12.0–15.0)
Immature Granulocytes: 1 %
Lymphocytes Relative: 3 %
Lymphs Abs: 0.4 10*3/uL — ABNORMAL LOW (ref 0.7–4.0)
MCH: 30.4 pg (ref 26.0–34.0)
MCHC: 31.9 g/dL (ref 30.0–36.0)
MCV: 95.1 fL (ref 80.0–100.0)
Monocytes Absolute: 1.2 10*3/uL — ABNORMAL HIGH (ref 0.1–1.0)
Monocytes Relative: 8 %
Neutro Abs: 13.5 10*3/uL — ABNORMAL HIGH (ref 1.7–7.7)
Neutrophils Relative %: 88 %
Platelets: 149 10*3/uL — ABNORMAL LOW (ref 150–400)
RBC: 4.48 MIL/uL (ref 3.87–5.11)
RDW: 14.8 % (ref 11.5–15.5)
WBC: 15.3 10*3/uL — ABNORMAL HIGH (ref 4.0–10.5)
nRBC: 0 % (ref 0.0–0.2)

## 2021-03-20 LAB — RESP PANEL BY RT-PCR (FLU A&B, COVID) ARPGX2
Influenza A by PCR: NEGATIVE
Influenza B by PCR: NEGATIVE
SARS Coronavirus 2 by RT PCR: NEGATIVE

## 2021-03-20 LAB — BLOOD GAS, VENOUS
Acid-base deficit: 2.2 mmol/L — ABNORMAL HIGH (ref 0.0–2.0)
Bicarbonate: 24.3 mmol/L (ref 20.0–28.0)
O2 Saturation: 89.7 %
Patient temperature: 98.6
pCO2, Ven: 51 mmHg (ref 44.0–60.0)
pH, Ven: 7.299 (ref 7.250–7.430)
pO2, Ven: 65.6 mmHg — ABNORMAL HIGH (ref 32.0–45.0)

## 2021-03-20 LAB — URINALYSIS, COMPLETE (UACMP) WITH MICROSCOPIC
Bilirubin Urine: NEGATIVE
Glucose, UA: NEGATIVE mg/dL
Hgb urine dipstick: NEGATIVE
Ketones, ur: NEGATIVE mg/dL
Leukocytes,Ua: NEGATIVE
Nitrite: NEGATIVE
Protein, ur: NEGATIVE mg/dL
Specific Gravity, Urine: 1.017 (ref 1.005–1.030)
pH: 7 (ref 5.0–8.0)

## 2021-03-20 LAB — COMPREHENSIVE METABOLIC PANEL
ALT: 103 U/L — ABNORMAL HIGH (ref 0–44)
AST: 308 U/L — ABNORMAL HIGH (ref 15–41)
Albumin: 3.6 g/dL (ref 3.5–5.0)
Alkaline Phosphatase: 84 U/L (ref 38–126)
Anion gap: 8 (ref 5–15)
BUN: 20 mg/dL (ref 8–23)
CO2: 27 mmol/L (ref 22–32)
Calcium: 8.8 mg/dL — ABNORMAL LOW (ref 8.9–10.3)
Chloride: 107 mmol/L (ref 98–111)
Creatinine, Ser: 0.68 mg/dL (ref 0.44–1.00)
GFR, Estimated: >60 mL/min (ref >60)
Glucose, Bld: 147 mg/dL — ABNORMAL HIGH (ref 70–99)
Potassium: 4 mmol/L (ref 3.5–5.1)
Sodium: 142 mmol/L (ref 135–145)
Total Bilirubin: 3.5 mg/dL — ABNORMAL HIGH (ref 0.3–1.2)
Total Protein: 6.3 g/dL — ABNORMAL LOW (ref 6.5–8.1)

## 2021-03-20 LAB — TROPONIN I (HIGH SENSITIVITY)
Troponin I (High Sensitivity): 10 ng/L (ref <18)
Troponin I (High Sensitivity): 4 ng/L (ref <18)

## 2021-03-20 LAB — CBG MONITORING, ED: Glucose-Capillary: 144 mg/dL — ABNORMAL HIGH (ref 70–99)

## 2021-03-20 LAB — LIPASE, BLOOD: Lipase: 18 U/L (ref 11–51)

## 2021-03-20 LAB — PROTIME-INR
INR: 1.4 — ABNORMAL HIGH (ref 0.8–1.2)
Prothrombin Time: 17.2 seconds — ABNORMAL HIGH (ref 11.4–15.2)

## 2021-03-20 LAB — LACTIC ACID, PLASMA: Lactic Acid, Venous: 1.5 mmol/L (ref 0.5–1.9)

## 2021-03-20 LAB — APTT: aPTT: 31 seconds (ref 24–36)

## 2021-03-20 LAB — BRAIN NATRIURETIC PEPTIDE: B Natriuretic Peptide: 747 pg/mL — ABNORMAL HIGH (ref 0.0–100.0)

## 2021-03-20 LAB — AMMONIA: Ammonia: 23 umol/L (ref 9–35)

## 2021-03-20 MED ORDER — SODIUM CHLORIDE 0.9 % IV BOLUS
1000.0000 mL | Freq: Once | INTRAVENOUS | Status: AC
  Administered 2021-03-20: 1000 mL via INTRAVENOUS

## 2021-03-20 MED ORDER — SODIUM CHLORIDE 0.9 % IV SOLN
500.0000 mg | Freq: Once | INTRAVENOUS | Status: AC
  Administered 2021-03-20: 500 mg via INTRAVENOUS
  Filled 2021-03-20: qty 500

## 2021-03-20 MED ORDER — APIXABAN 2.5 MG PO TABS
2.5000 mg | ORAL_TABLET | Freq: Two times a day (BID) | ORAL | Status: DC
  Administered 2021-03-21 – 2021-03-28 (×17): 2.5 mg via ORAL
  Filled 2021-03-20 (×16): qty 1

## 2021-03-20 MED ORDER — SODIUM CHLORIDE 0.9 % IV SOLN
100.0000 mg | Freq: Two times a day (BID) | INTRAVENOUS | Status: DC
  Administered 2021-03-21 – 2021-03-22 (×2): 100 mg via INTRAVENOUS
  Filled 2021-03-20 (×3): qty 100

## 2021-03-20 MED ORDER — ONDANSETRON HCL 4 MG/2ML IJ SOLN
4.0000 mg | Freq: Once | INTRAMUSCULAR | Status: AC
  Administered 2021-03-20: 4 mg via INTRAVENOUS
  Filled 2021-03-20: qty 2

## 2021-03-20 MED ORDER — SODIUM CHLORIDE 0.9 % IV SOLN
1.0000 g | Freq: Once | INTRAVENOUS | Status: AC
  Administered 2021-03-20: 1 g via INTRAVENOUS
  Filled 2021-03-20: qty 10

## 2021-03-20 MED ORDER — IOHEXOL 300 MG/ML  SOLN
80.0000 mL | Freq: Once | INTRAMUSCULAR | Status: AC | PRN
  Administered 2021-03-20: 80 mL via INTRAVENOUS

## 2021-03-20 MED ORDER — SODIUM CHLORIDE 0.9 % IV SOLN
INTRAVENOUS | Status: DC
  Administered 2021-03-21: 1000 mL via INTRAVENOUS

## 2021-03-20 MED ORDER — ATENOLOL 25 MG PO TABS
25.0000 mg | ORAL_TABLET | Freq: Every day | ORAL | Status: DC
  Administered 2021-03-21 – 2021-03-26 (×6): 25 mg via ORAL
  Filled 2021-03-20 (×6): qty 1

## 2021-03-20 MED ORDER — PANTOPRAZOLE SODIUM 40 MG PO TBEC
40.0000 mg | DELAYED_RELEASE_TABLET | Freq: Every day | ORAL | Status: DC
  Administered 2021-03-21 – 2021-03-28 (×8): 40 mg via ORAL
  Filled 2021-03-20 (×8): qty 1

## 2021-03-20 MED ORDER — SODIUM CHLORIDE 0.9 % IV SOLN
2.0000 g | INTRAVENOUS | Status: AC
  Administered 2021-03-21 – 2021-03-25 (×5): 2 g via INTRAVENOUS
  Filled 2021-03-20 (×3): qty 2
  Filled 2021-03-20 (×2): qty 20

## 2021-03-20 NOTE — Progress Notes (Signed)
RT NOTE:  Both RT's on duty attempted to draw ABG on pt and was unsuccessful. Spoke with ED-PA and RT was told it was okay to reorder it as a VBG. Vitals are stable at this time, RT will continue to monitor.

## 2021-03-20 NOTE — ED Triage Notes (Signed)
Pt BIB [facility] from home for altered mental status. Family reports AMS x2 days. Denies pain.  22 LAC, NS. More alert on arrival.  BP 108/60 HR 122, 90 after bolus SpO2 80% to 95% on 3L [place] CBG 225

## 2021-03-20 NOTE — ED Notes (Signed)
Patient transported to CT 

## 2021-03-20 NOTE — ED Notes (Signed)
Patient transported to X-ray 

## 2021-03-20 NOTE — H&P (Signed)
History and Physical   Laurie Pugh:[id number] DOB: 06/03/1932 DOA: 03/20/2021  Referring MD/NP/PA: Dr. Estell Harpin  PCP: Rodrigo Ran, MD   Outpatient Specialists: None  Patient coming from: Home  Chief Complaint: Altered mental status  HPI: Laurie Pugh is a 87 y.o. female with medical history significant of paroxysmal atrial fibrillation, coronary artery disease, GERD, esophageal stricture, essential hypertension, hyperlipidemia, PSVT, who is currently on Eliquis who was brought in by her daughter who noted that she was confused this morning.  She complained of epigastric pain in the ER and shortness of breath.  Denied any fever or sick contact.  Patient was evaluated and found to have evidence of pneumonia.  She is not on oxygen.Marland Kitchen  She was COVID-19 negative but chest x-ray confirming pneumonia so patient being admitted to the hospital with community-acquired pneumonia and metabolic encephalopathy as a result.  ED Course: Temperature 99.3 blood pressure 145/85.  Review of Systems: As per HPI otherwise 10 point review of systems negative.    Past Medical History:  Diagnosis Date   Allergy    Angina pectoris, unstable (HCC)    Atrial fibrillation (HCC)    Cataract    Dyslipidemia    Dysrhythmia    Esophageal stricture    GERD (gastroesophageal reflux disease)    Hyperlipidemia    Hypertension    Neck pain    PAF (paroxysmal atrial fibrillation) (HCC)    PSVT (paroxysmal supraventricular tachycardia) (HCC)     Past Surgical History:  Procedure Laterality Date   ABDOMINAL HYSTERECTOMY     APPENDECTOMY     BLADDER SURGERY     bladder surgery   BREAST EXCISIONAL BIOPSY     CARDIAC CATHETERIZATION  06/28/2004   no significant CAD (Dr. Laurell Josephs)   CATARACT EXTRACTION, BILATERAL  2008   CYSTOSCOPY W/ RETROGRADES Bilateral 04/11/2016   Procedure: CYSTOSCOPY WITH BILATERAL RETROGRADE PYELOGRAM;  Surgeon: Hildred Laser, MD;  Location: [facility];  Service: Urology;   Laterality: Bilateral;   ESOPHAGOGASTRODUODENOSCOPY N/A 01/30/2017   Procedure: ESOPHAGOGASTRODUODENOSCOPY (EGD);  Surgeon: Hilarie Fredrickson, MD;  Location: [facility];  Service: Endoscopy;  Laterality: N/A;   ESOPHAGOGASTRODUODENOSCOPY N/A 01/30/2017   Procedure: ESOPHAGOGASTRODUODENOSCOPY (EGD);  Surgeon: Napoleon Form, MD;  Location: [facility];  Service: Endoscopy;  Laterality: N/A;   IR VERTEBROPLASTY LUMBAR BX INC UNI/BIL INC/INJECT/IMAGING  03/15/2019   TRANSTHORACIC ECHOCARDIOGRAM  2009   borderline conc LVH; trace MR; mild-mod TR, RVSP 30-44mmHg     reports that she quit smoking about 17 years ago. She has a 40.00 pack-year smoking history. She has never used smokeless tobacco. She reports that she does not drink alcohol and does not use drugs.  Allergies  Allergen Reactions   Codeine Nausea And Vomiting   Erythromycin Other (See Comments)    Mycins cause severe nausea/vomiting and diarrhea (thinks it was erythromycin)    Family History  Problem Relation Age of Onset   Heart disease Mother    Heart disease Sister    Suicidality Brother    Cancer Sister    COPD Sister    Suicidality Child    Colon cancer Neg Hx    Colon polyps Neg Hx    Esophageal cancer Neg Hx    Rectal cancer Neg Hx    Stomach cancer Neg Hx      Prior to Admission medications   Medication Sig Start Date End Date Taking? Authorizing Provider  apixaban (ELIQUIS) 5 MG TABS tablet Take 2.5 mg by mouth  2 (two) times daily.    [provider]  atenolol (TENORMIN) 25 MG tablet TAKE 1 TABLET BY MOUTH  DAILY 03/01/21   Hilty, Lisette AbuKenneth C, MD  benazepril (LOTENSIN) 20 MG tablet Take 20 mg by mouth daily.  05/30/13   [provider]  chlorthalidone (HYGROTON) 25 MG tablet Take 12.5-25 mg by mouth daily as needed (for SBP >140).    [provider]  gabapentin (NEURONTIN) 100 MG capsule Take 100 mg by mouth 3 (three) times daily.  03/21/17   [provider]  LORazepam (ATIVAN) 1  MG tablet Take 0.5-1 mg by mouth 2 (two) times daily as needed for anxiety.  05/30/13   [provider]  Multiple Vitamins-Minerals (MULTIVITAMIN WITH MINERALS) tablet Take 1 tablet by mouth daily.    [provider]  omeprazole (PRILOSEC) 40 MG capsule TAKE 1 CAPSULE BY MOUTH  DAILY 01/14/21   Unk LightningLemmon, Jennifer Lynne, PA  oxyCODONE-acetaminophen (PERCOCET/ROXICET) 5-325 MG tablet Take 1 tablet by mouth every 6 (six) hours as needed (pain).  02/06/19   [provider]  rosuvastatin (CRESTOR) 10 MG tablet Take 10 mg by mouth at bedtime.     [provider]  TYMLOS 3120 MCG/1.56ML SOPN Inject 80 mcg into the skin daily. 02/28/19   [provider]    Physical Exam: Vitals:   03/20/21 1945 03/20/21 1952 03/20/21 2000 03/20/21 2030  BP:  125/62 118/70 138/79  Pulse: 81 72 70 94  Resp: 18 18 (!) 21 (!) 24  Temp:      TempSrc:      SpO2: 91% 90% 92% 91%  Weight:      Height:          Constitutional: Acutely ill looking no distress Vitals:   03/20/21 1945 03/20/21 1952 03/20/21 2000 03/20/21 2030  BP:  125/62 118/70 138/79  Pulse: 81 72 70 94  Resp: 18 18 (!) 21 (!) 24  Temp:      TempSrc:      SpO2: 91% 90% 92% 91%  Weight:      Height:       Eyes: PERRL, lids and conjunctivae normal ENMT: Mucous membranes are dry. Posterior pharynx clear of any exudate or lesions.Normal dentition.  Neck: normal, supple, no masses, no thyromegaly Respiratory: Coarse breath sound bilaterally with some rales and mild crackles normal respiratory effort. No accessory muscle use.  Cardiovascular: Regular rate and rhythm, no murmurs / rubs / gallops. No extremity edema. 2+ pedal pulses. No carotid bruits.  Abdomen: no tenderness, no masses palpated. No hepatosplenomegaly. Bowel sounds positive.  Musculoskeletal: no clubbing / cyanosis. No joint deformity upper and lower extremities. Good ROM, no contractures. Normal muscle tone.  Skin: no rashes, lesions, ulcers. No  induration Neurologic: CN 2-12 grossly intact. Sensation intact, DTR normal. Strength 5/5 in all 4.  Psychiatric: Normal judgment and insight. Alert and oriented x 2.  Depressed mood.     Labs on Admission: I have personally reviewed following labs and imaging studies  CBC: Recent Labs  Lab 03/20/21 1538  WBC 15.3*  NEUTROABS 13.5*  HGB 13.6  HCT 42.6  MCV 95.1  PLT 149*   Basic Metabolic Panel: Recent Labs  Lab 03/20/21 1538  NA 142  K 4.0  CL 107  CO2 27  GLUCOSE 147*  BUN 20  CREATININE 0.68  CALCIUM 8.8*   GFR: Estimated Creatinine Clearance: 41.8 mL/min (by C-G formula based on SCr of 0.68 mg/dL). Liver Function Tests: Recent Labs  Lab  03/20/21 1538  AST 308*  ALT 103*  ALKPHOS 84  BILITOT 3.5*  PROT 6.3*  ALBUMIN 3.6   Recent Labs  Lab 03/20/21 1627  LIPASE 18   Recent Labs  Lab 03/20/21 1627  AMMONIA 23   Coagulation Profile: Recent Labs  Lab 03/20/21 1534  INR 1.4*   Cardiac Enzymes: No results for input(s): CKTOTAL, CKMB, CKMBINDEX, TROPONINI in the last 168 hours. BNP (last 3 results) No results for input(s): PROBNP in the last 8760 hours. HbA1C: No results for input(s): HGBA1C in the last 72 hours. CBG: Recent Labs  Lab 03/20/21 1600  GLUCAP 144*   Lipid Profile: No results for input(s): CHOL, HDL, LDLCALC, TRIG, CHOLHDL, LDLDIRECT in the last 72 hours. Thyroid Function Tests: No results for input(s): TSH, T4TOTAL, FREET4, T3FREE, THYROIDAB in the last 72 hours. Anemia Panel: No results for input(s): VITAMINB12, FOLATE, FERRITIN, TIBC, IRON, RETICCTPCT in the last 72 hours. Urine analysis:    Component Value Date/Time   COLORURINE AMBER (A) 03/20/2021 1538   APPEARANCEUR HAZY (A) 03/20/2021 1538   LABSPEC 1.017 03/20/2021 1538   PHURINE 7.0 03/20/2021 1538   GLUCOSEU NEGATIVE 03/20/2021 1538   HGBUR NEGATIVE 03/20/2021 1538   BILIRUBINUR NEGATIVE 03/20/2021 1538   KETONESUR NEGATIVE 03/20/2021 1538   PROTEINUR  NEGATIVE 03/20/2021 1538   NITRITE NEGATIVE 03/20/2021 1538   LEUKOCYTESUR NEGATIVE 03/20/2021 1538   Sepsis Labs: (procalcitonin:4,lacticidven:4) ) Recent Results (from the past 240 hour(s))  Resp Panel by RT-PCR (Flu A&B, Covid) Nasopharyngeal Swab     Status: None   Collection Time: 03/20/21  3:38 PM   Specimen: Nasopharyngeal Swab; Nasopharyngeal(NP) swabs in vial transport medium  Result Value Ref Range Status   SARS Coronavirus 2 by RT PCR NEGATIVE NEGATIVE Final    Comment: (NOTE) SARS-CoV-2 target nucleic acids are NOT DETECTED.  The SARS-CoV-2 RNA is generally detectable in upper respiratory specimens during the acute phase of infection. The lowest concentration of SARS-CoV-2 viral copies this assay can detect is 138 copies/mL. A negative result does not preclude SARS-Cov-2 infection and should not be used as the sole basis for treatment or other patient management decisions. A negative result may occur with  improper specimen collection/handling, submission of specimen other than nasopharyngeal swab, presence of viral mutation(s) within the areas targeted by this assay, and inadequate number of viral copies(<138 copies/mL). A negative result must be combined with clinical observations, patient history, and epidemiological information. The expected result is Negative.  Fact Sheet for Patients:  BloggerCourse.com  Fact Sheet for Healthcare Providers:  SeriousBroker.it  This test is no t yet approved or cleared by the [place] FDA and  has been authorized for detection and/or diagnosis of SARS-CoV-2 by FDA under an Emergency Use Authorization (EUA). This EUA will remain  in effect (meaning this test can be used) for the duration of the COVID-19 declaration under Section 564(b)(1) of the Act, 21 U.S.C.section 360bbb-3(b)(1), unless the authorization is terminated  or revoked sooner.       Influenza A by  PCR NEGATIVE NEGATIVE Final   Influenza B by PCR NEGATIVE NEGATIVE Final    Comment: (NOTE) The Xpert Xpress SARS-CoV-2/FLU/RSV plus assay is intended as an aid in the diagnosis of influenza from Nasopharyngeal swab specimens and should not be used as a sole basis for treatment. Nasal washings and aspirates are unacceptable for Xpert Xpress SARS-CoV-2/FLU/RSV testing.  Fact Sheet for Patients: BloggerCourse.com  Fact Sheet for Healthcare Providers: SeriousBroker.it  This test is not yet approved  or cleared by the [place] and has been authorized for detection and/or diagnosis of SARS-CoV-2 by FDA under an Emergency Use Authorization (EUA). This EUA will remain in effect (meaning this test can be used) for the duration of the COVID-19 declaration under Section 564(b)(1) of the Act, 21 U.S.C. section 360bbb-3(b)(1), unless the authorization is terminated or revoked.  Performed at [facility], [address], [place], [place] [address]      Radiological Exams on Admission: CT Head Wo Contrast  Result Date: 03/20/2021 CLINICAL DATA:  Altered mental status for 2 days EXAM: CT HEAD WITHOUT CONTRAST TECHNIQUE: Contiguous axial images were obtained from the base of the skull through the vertex without intravenous contrast. COMPARISON:  06/21/2017 FINDINGS: Brain: No evidence of acute infarction, hemorrhage, hydrocephalus, extra-axial collection or mass lesion/mass effect. Mild periventricular white matter hypodensity and global cerebral volume loss. Vascular: No hyperdense vessel or unexpected calcification. Skull: Normal. Negative for fracture or focal lesion. Sinuses/Orbits: No acute finding. Other: None. IMPRESSION: No acute intracranial pathology. Small-vessel white matter disease and global cerebral volume loss in keeping with advanced patient age. Electronically Signed   By: Lauralyn Primes M.D.   On: 03/20/2021  16:26   CT ABDOMEN PELVIS W CONTRAST  Result Date: 03/20/2021 CLINICAL DATA:  Epigastric pain. EXAM: CT ABDOMEN AND PELVIS WITH CONTRAST TECHNIQUE: Multidetector CT imaging of the abdomen and pelvis was performed using the standard protocol following bolus administration of intravenous contrast. CONTRAST:  58mL OMNIPAQUE IOHEXOL 300 MG/ML  SOLN COMPARISON:  CT abdomen pelvis dated July 01, 2019. FINDINGS: Lower chest: Mild ground-glass density in the right greater than left lower lobes. Trace right pleural effusion. Hepatobiliary: No focal liver abnormality. Multiple tiny gallstones again noted. Asymmetric ill-defined gallbladder wall thickening adjacent to the liver (series 2, image 32). No surrounding inflammatory changes. No biliary dilatation. Pancreas: Unremarkable. No pancreatic ductal dilatation or surrounding inflammatory changes. Spleen: Normal in size without focal abnormality. Adrenals/Urinary Tract: The adrenal glands are unremarkable. Slight interval increase in size of the 3.0 cm left renal cyst. Other subcentimeter low-density lesions in both kidneys are too small to characterize. No renal calculi or hydronephrosis. Punctate calculus in the bladder. Stomach/Bowel: Unchanged large hiatal hernia. No obstruction or bowel wall thickening. Vascular/Lymphatic: Aortic atherosclerosis. No enlarged abdominal or pelvic lymph nodes. Reproductive: Status post hysterectomy. No adnexal masses. Other: Interval increase in size of the heterogeneous fat containing mass in the presacral space, currently measuring 3.1 x 3.6 cm (series 2, image 59), previously 2.3 x 2.7 cm. No free fluid or pneumoperitoneum. Musculoskeletal: No acute or significant osseous findings. Chronic L1 compression deformity. IMPRESSION: 1. Cholelithiasis. Asymmetric ill-defined gallbladder wall thickening adjacent to the liver. Further evaluation with right upper quadrant ultrasound is recommended to exclude acute cholecystitis. 2. Interval  increase in size of the heterogeneous fat containing mass in the presacral space, currently measuring 3.1 x 3.6 cm, previously 2.3 x 2.7 cm. Differential considerations include myelolipoma or liposarcoma. 3. Mild ground-glass density in the right greater than left lower lobes, concerning for aspiration in the setting of a large hiatal hernia. Trace right pleural effusion. 4. Punctate bladder calculus. 5. Aortic Atherosclerosis (ICD10-I70.0). Electronically Signed   By: Obie Dredge M.D.   On: 03/20/2021 18:39   DG Chest Port 1 View  Result Date: 03/20/2021 CLINICAL DATA:  Altered mental status. EXAM: PORTABLE CHEST 1 VIEW COMPARISON:  Jan 29, 2017 FINDINGS: Mild to moderate severity diffusely increased interstitial lung markings are seen. Mild atelectasis and/or infiltrate  is noted within the bilateral lung bases. There is no evidence of a pleural effusion or pneumothorax. The cardiac silhouette is mildly enlarged. Marked severity calcification of the aortic arch is seen. There is a large hiatal hernia. The visualized skeletal structures are unremarkable. IMPRESSION: 1. Cardiomegaly and diffusely increased interstitial lung markings which are likely, in part, chronic in nature. A superimposed component of interstitial edema cannot be excluded. 2. Mild bibasilar atelectasis and/or infiltrate. 3. Large hiatal. Electronically Signed   By: Aram Candela M.D.   On: 03/20/2021 16:47   VAS [place] LOWER EXTREMITY VENOUS (DVT) (ONLY [facility] & [facility])  Result Date: 03/20/2021  Lower Venous DVT Study Patient Name:  IRIDESSA HARROW  Date of Exam:   03/20/2021 Medical Rec #: [id number]          Accession #:    [id number] Date of Birth: 12-Dec-1931         Patient Gender: F Patient Age:   88Y Exam Location:  [facility] Procedure:      VAS [place] LOWER EXTREMITY VENOUS (DVT) Referring Phys: [id number] Rose Phi BADALAMENTE --------------------------------------------------------------------------------  Indications: Swelling.   Comparison Study: Prior negative Right LEV done 01/05/21 Performing Technologist: Sherren Kerns RVS  Examination Guidelines: A complete evaluation includes B-mode imaging, spectral Doppler, color Doppler, and power Doppler as needed of all accessible portions of each vessel. Bilateral testing is considered an integral part of a complete examination. Limited examinations for reoccurring indications may be performed as noted. The reflux portion of the exam is performed with the patient in reverse Trendelenburg.  +---------+---------------+---------+-----------+----------+--------------+ RIGHT    CompressibilityPhasicitySpontaneityPropertiesThrombus Aging +---------+---------------+---------+-----------+----------+--------------+ CFV      Full           Yes      Yes                                 +---------+---------------+---------+-----------+----------+--------------+ SFJ      Full                                                        +---------+---------------+---------+-----------+----------+--------------+ FV Prox  Full                                                        +---------+---------------+---------+-----------+----------+--------------+ FV Mid   Full                                                        +---------+---------------+---------+-----------+----------+--------------+ FV DistalFull                                                        +---------+---------------+---------+-----------+----------+--------------+ PFV      Full                                                        +---------+---------------+---------+-----------+----------+--------------+  POP      Full           Yes      Yes                                 +---------+---------------+---------+-----------+----------+--------------+ PTV      Full                                                         +---------+---------------+---------+-----------+----------+--------------+ PERO     Full                                                        +---------+---------------+---------+-----------+----------+--------------+   +----+---------------+---------+-----------+----------+--------------+ LEFTCompressibilityPhasicitySpontaneityPropertiesThrombus Aging +----+---------------+---------+-----------+----------+--------------+ CFV Full           Yes      Yes                                 +----+---------------+---------+-----------+----------+--------------+     Summary: RIGHT: - Findings appear essentially unchanged compared to previous examination. - There is no evidence of deep vein thrombosis in the lower extremity.  LEFT: - No evidence of common femoral vein obstruction.  *See table(s) above for measurements and observations.    Preliminary    US Abdomen Limited RUQ (LIVER/GB)  Result Date: 03/20/2021 CLINICAL DATA:  Cholelithiasis. EXAM: ULTRASOUND ABDOMEN LIMITED RIGHT UPPER QUADRANT COMPARISON:  None. FINDINGS: Gallbladder: A 1.4 cm lobulated echogenic area is seen within the dependent portion of the gallbladder lumen. A mild amount of surrounding heterogeneous gallbladder sludge is also noted. The gallbladder wall measures 2.6 mm in thickness. No sonographic Murphy sign noted by sonographer. Common bile duct: Diameter: 3.6 mm Liver: No focal lesion identified. Mild, diffusely increased echogenicity of the liver parenchyma is noted. Portal vein is patent on color Doppler imaging with normal direction of blood flow towards the liver. Other: Of incidental note is the presence of a small right-sided pleural effusion. IMPRESSION: 1. Gallbladder sludge and suspected cholelithiasis, without acute cholecystitis. 2. Fatty liver. Electronically Signed   By: Aram Candela M.D.   On: 03/20/2021 20:19      Assessment/Plan Principal Problem:   Pneumonia Active Problems:   Atrial  fibrillation [facility])   Essential hypertension   Esophageal stricture   Chronic anticoagulation     #1 community-acquired pneumonia: Patient will be admitted.  IV Rocephin and doxycycline.  Allergic to Zithromax.  Continue blood and sputum cultures.  Oxygen as needed on monitor.  #2 essential hypertension: Patient has been on benazepril chlorthalidone at home.  Also atenolol.  Confirm and resume.  #3 atrial fibrillation: Rate is controlled.  Continue Eliquis.  #4 esophageal stricture: Continue PPIs  #5 GERD: Continue PPI.  #6 hyperlipidemia: Resume Crestor if confirmed  #7 chronic pain syndrome: Has been on Percocet at home.  May resume here.   DVT prophylaxis: Lovenox Code Status: Full code Family Communication: Daughter Disposition Plan: Home Consults called: None Admission status: Inpatient  Severity of Illness: The appropriate patient status for this patient is INPATIENT. Inpatient status  is judged to be reasonable and necessary in order to provide the required intensity of service to ensure the patient's safety. The patient's presenting symptoms, physical exam findings, and initial radiographic and laboratory data in the context of their chronic comorbidities is felt to place them at high risk for further clinical deterioration. Furthermore, it is not anticipated that the patient will be medically stable for discharge from the hospital within 2 midnights of admission. The following factors support the patient status of inpatient.   " The patient's presenting symptoms include confusion. " The worrisome physical exam findings include coarse breath sounds. " The initial radiographic and laboratory data are worrisome because of evidence of pneumonia on imaging. " The chronic co-morbidities include hypertension.   * I certify that at the point of admission it is my clinical judgment that the patient will require inpatient hospital care spanning beyond 2 midnights from the point of  admission due to high intensity of service, high risk for further deterioration and high frequency of surveillance required.Lonia Blood MD [facility] Pager [phone]  If 7PM-7AM, please contact night-coverage [email or web address] Password [facility]  03/20/2021, 8:48 PM

## 2021-03-20 NOTE — Progress Notes (Signed)
VASCULAR LAB    Right lower extremity venous duplex has been performed.  See CV proc for preliminary results.  Gave verbal report to Dr. Areta Haber, [facility], RVT 03/20/2021, 7:33 PM

## 2021-03-20 NOTE — ED Provider Notes (Signed)
[facility] Provider Note   CSN: [id number] Arrival date & time: 03/20/21  1421     History Chief Complaint  Patient presents with   Altered Mental Status    Laurie Pugh is a 87 y.o. female with a history of atrial fibrillation on Eliquis, hypertension, lipidemia, GERD.  Patient presents emergency room with a chief complaint of altered mental status.  Patient's daughter at bedside reports that patient was confused when she came to visit patient this morning.  Patient lives alone.  Patient is found to be alert to person, place.  Patient is with monthly however is unable to state the year.  Patient endorses epigastric pain and nausea last night however states the symptoms have resolved.  Denies any pain, discomfort, or nausea at this time.  Patient reports that she was unable to sleep at night due to her symptoms.  Patient also endorses swelling and tenderness to right lower extremity.  Patient is unable to give a timeline of how long this has been present.  Patient denies any to lower extremities.  Patient endorses rhinorrhea and nasal congestion.  Patient denies any fevers, chills, cough, chest pain, shortness of breath, vomiting, diarrhea, constipation, blood in stool, melena, dysuria, hematuria, urinary frequency, neck pain, back pain, slurred speech, facial asymmetry, numbness, weakness, syncope.  Patient denies any recent falls or injuries.  Patient states that she took all her medications including Eliquis this morning.  Patient denies using any oxygen at home.  Patient and patient's daughter are unsure of vaccination status for influenza and COVID-19.        Altered Mental Status Presenting symptoms: confusion   Associated symptoms: abdominal pain and nausea   Associated symptoms: no fever, no headaches, no light-headedness, no rash, no vomiting and no weakness       Past Medical History:  Diagnosis Date   Allergy    Angina  pectoris, unstable (HCC)    Atrial fibrillation (HCC)    Cataract    Dyslipidemia    Dysrhythmia    Esophageal stricture    GERD (gastroesophageal reflux disease)    Hyperlipidemia    Hypertension    Neck pain    PAF (paroxysmal atrial fibrillation) (HCC)    PSVT (paroxysmal supraventricular tachycardia) (HCC)     Patient Active Problem List   Diagnosis Date Noted   Stenosis of carotid artery 02/12/2018   Subdural hematoma (HCC) 05/21/2017   Chronic anticoagulation 05/21/2017   Esophageal stricture 01/30/2017   Esophageal dysphagia    Esophageal obstruction due to food impaction    Sinus bradycardia 05/26/2014   PVC's (premature ventricular contractions) 05/26/2014   Paroxysmal SVT (supraventricular tachycardia) (HCC) 07/16/2013   Atrial fibrillation (HCC) 06/24/2013   Mixed hyperlipidemia 06/24/2013   Essential hypertension 06/24/2013   Angina pectoris, unstable (HCC) 06/24/2013   Neck pain 06/24/2013   DOE (dyspnea on exertion) 06/24/2013    Past Surgical History:  Procedure Laterality Date   ABDOMINAL HYSTERECTOMY     APPENDECTOMY     BLADDER SURGERY     bladder surgery   BREAST EXCISIONAL BIOPSY     CARDIAC CATHETERIZATION  06/28/2004   no significant CAD (Dr. Laurell Josephs)   CATARACT EXTRACTION, BILATERAL  2008   CYSTOSCOPY W/ RETROGRADES Bilateral 04/11/2016   Procedure: CYSTOSCOPY WITH BILATERAL RETROGRADE PYELOGRAM;  Surgeon: Hildred Laser, MD;  Location: [facility];  Service: Urology;  Laterality: Bilateral;   ESOPHAGOGASTRODUODENOSCOPY N/A 01/30/2017   Procedure: ESOPHAGOGASTRODUODENOSCOPY (EGD);  Surgeon: Hilarie Fredrickson,  MD;  Location: [facility];  Service: Endoscopy;  Laterality: N/A;   ESOPHAGOGASTRODUODENOSCOPY N/A 01/30/2017   Procedure: ESOPHAGOGASTRODUODENOSCOPY (EGD);  Surgeon: Napoleon Form, MD;  Location: [facility];  Service: Endoscopy;  Laterality: N/A;   IR VERTEBROPLASTY LUMBAR BX INC UNI/BIL INC/INJECT/IMAGING  03/15/2019   TRANSTHORACIC  ECHOCARDIOGRAM  2009   borderline conc LVH; trace MR; mild-mod TR, RVSP 30-71mmHg     OB History   No obstetric history on file.     Family History  Problem Relation Age of Onset   Heart disease Mother    Heart disease Sister    Suicidality Brother    Cancer Sister    COPD Sister    Suicidality Child    Colon cancer Neg Hx    Colon polyps Neg Hx    Esophageal cancer Neg Hx    Rectal cancer Neg Hx    Stomach cancer Neg Hx     Social History   Tobacco Use   Smoking status: Former    Packs/day: 1.00    Years: 40.00    Pack years: 40.00    Types: Cigarettes    Quit date: 06/25/2003    Years since quitting: 17.7   Smokeless tobacco: Never  Vaping Use   Vaping Use: Never used  Substance Use Topics   Alcohol use: No   Drug use: No    Home Medications Prior to Admission medications   Medication Sig Start Date End Date Taking? Authorizing Provider  apixaban (ELIQUIS) 5 MG TABS tablet Take 2.5 mg by mouth 2 (two) times daily.    [provider]  atenolol (TENORMIN) 25 MG tablet TAKE 1 TABLET BY MOUTH  DAILY 03/01/21   Hilty, Lisette Abu, MD  benazepril (LOTENSIN) 20 MG tablet Take 20 mg by mouth daily.  05/30/13   [provider]  chlorthalidone (HYGROTON) 25 MG tablet Take 12.5-25 mg by mouth daily as needed (for SBP >140).    [provider]  gabapentin (NEURONTIN) 100 MG capsule Take 100 mg by mouth 3 (three) times daily.  03/21/17   [provider]  LORazepam (ATIVAN) 1 MG tablet Take 0.5-1 mg by mouth 2 (two) times daily as needed for anxiety.  05/30/13   [provider]  Multiple Vitamins-Minerals (MULTIVITAMIN WITH MINERALS) tablet Take 1 tablet by mouth daily.    [provider]  omeprazole (PRILOSEC) 40 MG capsule TAKE 1 CAPSULE BY MOUTH  DAILY 01/14/21   Unk Lightning, PA  oxyCODONE-acetaminophen (PERCOCET/ROXICET) 5-325 MG tablet Take 1 tablet by mouth every 6 (six) hours as needed (pain).  02/06/19   [provider]  rosuvastatin (CRESTOR) 10 MG tablet Take 10 mg by mouth at bedtime.     [provider]  TYMLOS 3120 MCG/1.56ML SOPN Inject 80 mcg into the skin daily. 02/28/19   [provider]    Allergies    Codeine and Erythromycin  Review of Systems   Review of Systems  Constitutional:  Negative for chills and fever.  HENT:  Positive for congestion and rhinorrhea. Negative for sore throat.   Eyes:  Negative for visual disturbance.  Respiratory:  Negative for cough and shortness of breath.   Cardiovascular:  Positive for leg swelling. Negative for chest pain.  Gastrointestinal:  Positive for abdominal pain and nausea. Negative for abdominal distention, blood in stool, constipation, diarrhea and vomiting.  Genitourinary:  Negative for difficulty urinating, dysuria, frequency, hematuria, vaginal bleeding, vaginal discharge and vaginal pain.  Musculoskeletal:  Positive for  myalgias. Negative for back pain and neck pain.  Skin:  Negative for color change, pallor, rash and wound.  Neurological:  Negative for dizziness, tremors, syncope, speech difficulty, weakness, light-headedness, numbness and headaches.  Psychiatric/Behavioral:  Positive for confusion and sleep disturbance.    Physical Exam Updated Vital Signs BP 110/72 (BP Location: Right Arm)   Pulse 96   Temp 99.3 F (37.4 C) (Oral)   Resp 18   Ht 5\' 3"  (1.6 m)   Wt 54.4 kg   SpO2 96%   BMI 21.26 kg/m   Physical Exam Vitals and nursing note reviewed.  Constitutional:      General: She is not in acute distress.    Appearance: She is not ill-appearing, toxic-appearing or diaphoretic.     Interventions: Nasal cannula in place.  HENT:     Head: Normocephalic and atraumatic. No raccoon eyes, Battle's sign, abrasion, contusion, masses, right periorbital erythema, left periorbital erythema or laceration.     Jaw: No trismus or pain on movement.     Mouth/Throat:     Pharynx: No pharyngeal swelling,  oropharyngeal exudate, posterior oropharyngeal erythema or uvula swelling.  Eyes:     General: No scleral icterus.       Right eye: No discharge.        Left eye: No discharge.     Extraocular Movements: Extraocular movements intact.     Conjunctiva/sclera: Conjunctivae normal.     Pupils: Pupils are equal, round, and reactive to light.  Cardiovascular:     Rate and Rhythm: Normal rate.  Pulmonary:     Effort: Pulmonary effort is normal. No tachypnea, bradypnea or respiratory distress.     Breath sounds: Normal breath sounds. No stridor.     Comments: Oxygen saturation drops to 89% on room air, satting 95 or greater on 2 L of O2 via nasal cannula  Speaks in full complete sentences without difficulty Abdominal:     General: Abdomen is flat. Bowel sounds are normal. There is no distension. There are no signs of injury.     Palpations: Abdomen is soft. There is no mass or pulsatile mass.     Tenderness: There is no abdominal tenderness. There is no guarding or rebound.     Hernia: There is no hernia in the umbilical area or ventral area.  Musculoskeletal:     Cervical back: Normal range of motion and neck supple. No rigidity.     Comments: No midline tenderness or deformity to cervical, thoracic, or lumbar spine.  Episodes of symptomatic noted  Skin:    General: Skin is warm and dry.  Neurological:     General: No focal deficit present.     Mental Status: She is easily aroused.     GCS: GCS eye subscore is 4. GCS verbal subscore is 5. GCS motor subscore is 6.     Cranial Nerves: No cranial nerve deficit or facial asymmetry.     Sensory: Sensation is intact.     Motor: No weakness, tremor, seizure activity or pronator drift.     Coordination: Finger-Nose-Finger Test normal.     Comments: CN II-XII intact; performed in supine position, +5 strength to bilateral upper extremities, +5 strength to dorsiflexion and plantarflexion, patient able to left both legs against gravity and hold each  there without difficulty.  Sensation to light touch intact to bilateral upper and lower extremities  Patient is oriented to person, place.  Correctly identifies month but is unable to notify year.  Patient  is noted to be somnolent however easily aroused to voice  Psychiatric:        Behavior: Behavior is cooperative.    ED Results / Procedures / Treatments   Labs (all labs ordered are listed, but only abnormal results are displayed) Labs Reviewed  COMPREHENSIVE METABOLIC PANEL - Abnormal; Notable for the following components:      Result Value   Glucose, Bld 147 (*)    Calcium 8.8 (*)    Total Protein 6.3 (*)    AST 308 (*)    ALT 103 (*)    Total Bilirubin 3.5 (*)    All other components within normal limits  CBC WITH DIFFERENTIAL/PLATELET - Abnormal; Notable for the following components:   WBC 15.3 (*)    Platelets 149 (*)    Neutro Abs 13.5 (*)    Lymphs Abs 0.4 (*)    Monocytes Absolute 1.2 (*)    Abs Immature Granulocytes 0.16 (*)    All other components within normal limits  URINALYSIS, COMPLETE (UACMP) WITH MICROSCOPIC - Abnormal; Notable for the following components:   Color, Urine AMBER (*)    APPearance HAZY (*)    Bacteria, UA RARE (*)    All other components within normal limits  CBG MONITORING, ED - Abnormal; Notable for the following components:   Glucose-Capillary 144 (*)    All other components within normal limits  RESP PANEL BY RT-PCR (FLU A&B, COVID) [id number]  URINE CULTURE  LACTIC ACID, PLASMA  LACTIC ACID, PLASMA  PROTIME-INR  APTT  AMMONIA  LIPASE, BLOOD  BLOOD GAS, VENOUS  TROPONIN I (HIGH SENSITIVITY)    EKG None  Radiology CT Head Wo Contrast  Result Date: 03/20/2021 CLINICAL DATA:  Altered mental status for 2 days EXAM: CT HEAD WITHOUT CONTRAST TECHNIQUE: Contiguous axial images were obtained from the base of the skull through the vertex without intravenous contrast. COMPARISON:  06/21/2017 FINDINGS: Brain: No evidence of acute  infarction, hemorrhage, hydrocephalus, extra-axial collection or mass lesion/mass effect. Mild periventricular white matter hypodensity and global cerebral volume loss. Vascular: No hyperdense vessel or unexpected calcification. Skull: Normal. Negative for fracture or focal lesion. Sinuses/Orbits: No acute finding. Other: None. IMPRESSION: No acute intracranial pathology. Small-vessel white matter disease and global cerebral volume loss in keeping with advanced patient age. Electronically Signed   By: Lauralyn PrimesAlex  Bibbey M.D.   On: 03/20/2021 16:26   DG Chest Port 1 View  Result Date: 03/20/2021 CLINICAL DATA:  Altered mental status. EXAM: PORTABLE CHEST 1 VIEW COMPARISON:  Jan 29, 2017 FINDINGS: Mild to moderate severity diffusely increased interstitial lung markings are seen. Mild atelectasis and/or infiltrate is noted within the bilateral lung bases. There is no evidence of a pleural effusion or pneumothorax. The cardiac silhouette is mildly enlarged. Marked severity calcification of the aortic arch is seen. There is a large hiatal hernia. The visualized skeletal structures are unremarkable. IMPRESSION: 1. Cardiomegaly and diffusely increased interstitial lung markings which are likely, in part, chronic in nature. A superimposed component of interstitial edema cannot be excluded. 2. Mild bibasilar atelectasis and/or infiltrate. 3. Large hiatal. Electronically Signed   By: Aram Candelahaddeus  Houston M.D.   On: 03/20/2021 16:47    Procedures Procedures   Medications Ordered in ED Medications  sodium chloride 0.9 % bolus 1,000 mL (has no administration in time range)    ED Course  I have reviewed the triage vital signs and the nursing notes.  Pertinent labs & imaging results that were available during my care of  the patient were reviewed by me and considered in my medical decision making (see chart for details).    MDM Rules/Calculators/A&P                          87 year old female no acute distress,  nontoxic-appearing.  Patient is sleeping comfortably in hospital bed on 2 LPM of O2 via nasal cannula.  Patient is easily aroused to voice.  Patient found to be alert to person, place.  Patient correctly identifies month but unable to identify year.  Sent to the emergency department by family member due to new onset of confusion.  Patient lives alone but last spoke to patient on the phone yesterday evening and she was acting her normal self.  Patient reports episode of epigastric abdominal pain and nausea yesterday evening.  Patient reports that the symptoms kept her up all night.  Patient denies any abdominal pain or nausea at present.  On physical exam abdomen soft, nondistended, nontender.  Normoactive bowel sounds.  She denies any recent falls or injuries.  Patient has no focal neurological deficits.  Head is atraumatic.  No midline tenderness or deformity to cervical, thoracic, or lumbar spine.  Patient has swelling and tenderness to right calf.  Will obtain ultrasound to evaluate for DVT.  When oxygen was removed and patient oxygen saturation dropped down to 89% on room air.  Patient was placed back on 2 L of O2 via nasal cannula.  Patient is not on any home oxygen.  Lungs clear to auscultation bilaterally.  Patient able speak in full complete sentences without difficulty.    Patient noted to have temperature of 99.3 F upon arrival and BP of 110/72.  Patient received 500 mL fluid bolus with EMS.  Will obtain rectal temperature.  No history of heart failure; does not appear fluid overloaded at this time.  Will give patient 1 L fluid bolus.  CBC shows leukocytosis at 15.3 with increased neutrophil count.  No signs of anemia.  CMP shows increased AST at 308, ALT of 103, and total bilirubin at 3.5.  This combined with patient's complaints of epigastric pain concerning for acute cholecystitis, liver f abnormality or other acute intra-abdominal infection.  Will obtain CT scan of abdomen and pelvis to  further evaluate.   Urinalysis shows no signs of infection. Lactic acid within normal limits. Respiratory panel negative for COVID-19 and influenza. CBG 144. Noncontrast head CT shows no acute intracranial abnormality. Troponin 10  Chest x-ray shows cardiomegaly and diffuse increased interstitial lung markings which are likely more chronic in nature.  A superimposed component of interstitial edema cannot be excluded.  Mild bibasilar atelectasis and/or infiltrate.  Large hiatal hernia. Due to concern for interested on chest x-ray and patient's new oxygen requirement will cover for community-acquired pneumonia with IV ceftriaxone and azithromycin.  Patient does have noted allergy to erythromycin however reported reaction of nausea, vomiting, diarrhea likely normal side effect of this medication.  CT abdomen pelvis shows: 1. Cholelithiasis. Asymmetric ill-defined gallbladder wall thickening adjacent to the liver. Further evaluation with right upper quadrant ultrasound is recommended to exclude acute cholecystitis. 2. Interval increase in size of the heterogeneous fat containing mass in the presacral space, currently measuring 3.1 x 3.6 cm, previously 2.3 x 2.7 cm. Differential considerations include myelolipoma or liposarcoma. 3. Mild ground-glass density in the right greater than left lower lobes, concerning for aspiration in the setting of a large hiatal hernia. Trace right pleural effusion.  Will  obtain right upper quadrant ultrasound to evaluate for cholecystitis.  Ultrasound shows gallbladder sludge and suspected cholelithiasis without acute cholecystitis.  Ultrasound imaging of right lower extremity shows no signs of DVT.  BNP elevated at 747.  We will hold any further fluid boluses as patient blood pressure is stable.  Patient has no previous echocardiogram or history of heart failure.  1835 Spoke to Dr. Mikeal Hawthorne who will see the patient for admission  Patient was discussed with and  evaluated by Dr. Estell Harpin.   MAURYA NETHERY was evaluated in Emergency Department on 03/20/2021 for the symptoms described in the history of present illness. She was evaluated in the context of the global COVID-19 pandemic, which necessitated consideration that the patient might be at risk for infection with the SARS-CoV-2 virus that causes COVID-19. Institutional protocols and algorithms that pertain to the evaluation of patients at risk for COVID-19 are in a state of rapid change based on information released by regulatory bodies including the CDC and federal and state organizations. These policies and algorithms were followed during the patient's care in the ED.   Final Clinical Impression(s) / ED Diagnoses Final diagnoses:  None    Rx / DC Orders ED Discharge Orders     None        Haskel Schroeder, PA-C 03/21/21 0200    Bethann Berkshire, MD 03/22/21 [phone]

## 2021-03-21 ENCOUNTER — Encounter (HOSPITAL_COMMUNITY): Payer: Self-pay | Admitting: Internal Medicine

## 2021-03-21 LAB — COMPREHENSIVE METABOLIC PANEL
ALT: 88 U/L — ABNORMAL HIGH (ref 0–44)
AST: 195 U/L — ABNORMAL HIGH (ref 15–41)
Albumin: 3.3 g/dL — ABNORMAL LOW (ref 3.5–5.0)
Alkaline Phosphatase: 86 U/L (ref 38–126)
Anion gap: 9 (ref 5–15)
BUN: 18 mg/dL (ref 8–23)
CO2: 23 mmol/L (ref 22–32)
Calcium: 8.5 mg/dL — ABNORMAL LOW (ref 8.9–10.3)
Chloride: 108 mmol/L (ref 98–111)
Creatinine, Ser: 0.48 mg/dL (ref 0.44–1.00)
GFR, Estimated: >60 mL/min (ref >60)
Glucose, Bld: 100 mg/dL — ABNORMAL HIGH (ref 70–99)
Potassium: 4.5 mmol/L (ref 3.5–5.1)
Sodium: 140 mmol/L (ref 135–145)
Total Bilirubin: 4.6 mg/dL — ABNORMAL HIGH (ref 0.3–1.2)
Total Protein: 6 g/dL — ABNORMAL LOW (ref 6.5–8.1)

## 2021-03-21 LAB — CBC WITH DIFFERENTIAL/PLATELET
Abs Immature Granulocytes: 0.06 10*3/uL (ref 0.00–0.07)
Basophils Absolute: 0.1 10*3/uL (ref 0.0–0.1)
Basophils Relative: 0 %
Eosinophils Absolute: 0 10*3/uL (ref 0.0–0.5)
Eosinophils Relative: 0 %
HCT: 44.7 % (ref 36.0–46.0)
Hemoglobin: 14 g/dL (ref 12.0–15.0)
Immature Granulocytes: 0 %
Lymphocytes Relative: 6 %
Lymphs Abs: 0.9 10*3/uL (ref 0.7–4.0)
MCH: 30.4 pg (ref 26.0–34.0)
MCHC: 31.3 g/dL (ref 30.0–36.0)
MCV: 97.2 fL (ref 80.0–100.0)
Monocytes Absolute: 1.3 10*3/uL — ABNORMAL HIGH (ref 0.1–1.0)
Monocytes Relative: 8 %
Neutro Abs: 13.3 10*3/uL — ABNORMAL HIGH (ref 1.7–7.7)
Neutrophils Relative %: 86 %
Platelets: 135 10*3/uL — ABNORMAL LOW (ref 150–400)
RBC: 4.6 MIL/uL (ref 3.87–5.11)
RDW: 15.2 % (ref 11.5–15.5)
WBC: 15.6 10*3/uL — ABNORMAL HIGH (ref 4.0–10.5)
nRBC: 0 % (ref 0.0–0.2)

## 2021-03-21 LAB — BLOOD CULTURE ID PANEL (REFLEXED) - BCID2

## 2021-03-21 LAB — RAPID URINE DRUG SCREEN, HOSP PERFORMED
Amphetamines: NOT DETECTED
Barbiturates: NOT DETECTED
Benzodiazepines: POSITIVE — AB
Cocaine: NOT DETECTED
Opiates: POSITIVE — AB
Tetrahydrocannabinol: NOT DETECTED

## 2021-03-21 LAB — STREP PNEUMONIAE URINARY ANTIGEN: Strep Pneumo Urinary Antigen: NEGATIVE

## 2021-03-21 LAB — HIV ANTIBODY (ROUTINE TESTING W REFLEX): HIV Screen 4th Generation wRfx: NONREACTIVE

## 2021-03-21 MED ORDER — OXYCODONE-ACETAMINOPHEN 5-325 MG PO TABS
1.0000 | ORAL_TABLET | Freq: Four times a day (QID) | ORAL | Status: DC | PRN
  Administered 2021-03-24 – 2021-03-29 (×2): 1 via ORAL
  Filled 2021-03-21 (×3): qty 1

## 2021-03-21 MED ORDER — GABAPENTIN 100 MG PO CAPS
100.0000 mg | ORAL_CAPSULE | Freq: Three times a day (TID) | ORAL | Status: DC
  Administered 2021-03-21 – 2021-03-26 (×14): 100 mg via ORAL
  Filled 2021-03-21 (×19): qty 1

## 2021-03-21 MED ORDER — ROSUVASTATIN CALCIUM 10 MG PO TABS
10.0000 mg | ORAL_TABLET | Freq: Every day | ORAL | Status: DC
  Administered 2021-03-21 – 2021-03-28 (×8): 10 mg via ORAL
  Filled 2021-03-21 (×8): qty 1

## 2021-03-21 MED ORDER — BENAZEPRIL HCL 10 MG PO TABS
20.0000 mg | ORAL_TABLET | Freq: Every day | ORAL | Status: DC
  Administered 2021-03-21 – 2021-03-28 (×8): 20 mg via ORAL
  Filled 2021-03-21 (×9): qty 2

## 2021-03-21 MED ORDER — IBUPROFEN 200 MG PO TABS
400.0000 mg | ORAL_TABLET | Freq: Once | ORAL | Status: AC
  Administered 2021-03-21: 400 mg via ORAL
  Filled 2021-03-21: qty 2

## 2021-03-21 MED ORDER — POLYETHYLENE GLYCOL 3350 17 G PO PACK
17.0000 g | PACK | Freq: Every day | ORAL | Status: DC | PRN
  Administered 2021-03-22: 17 g via ORAL
  Filled 2021-03-21: qty 1

## 2021-03-21 MED ORDER — ACETAMINOPHEN 325 MG PO TABS
650.0000 mg | ORAL_TABLET | Freq: Four times a day (QID) | ORAL | Status: DC | PRN
  Administered 2021-03-21 – 2021-03-28 (×4): 650 mg via ORAL
  Filled 2021-03-21 (×5): qty 2

## 2021-03-21 MED ORDER — BISACODYL 10 MG RE SUPP
10.0000 mg | Freq: Every day | RECTAL | Status: DC | PRN
  Administered 2021-03-21: 10 mg via RECTAL
  Filled 2021-03-21: qty 1

## 2021-03-21 MED ORDER — LORAZEPAM 0.5 MG PO TABS
0.5000 mg | ORAL_TABLET | Freq: Two times a day (BID) | ORAL | Status: DC | PRN
  Administered 2021-03-22 – 2021-03-23 (×2): 0.5 mg via ORAL
  Administered 2021-03-24: 1 mg via ORAL
  Administered 2021-03-25 – 2021-03-28 (×4): 0.5 mg via ORAL
  Filled 2021-03-21: qty 2
  Filled 2021-03-21 (×6): qty 1

## 2021-03-21 MED ORDER — CHLORTHALIDONE 25 MG PO TABS
12.5000 mg | ORAL_TABLET | Freq: Every day | ORAL | Status: DC | PRN
  Administered 2021-03-24: 25 mg via ORAL
  Filled 2021-03-21: qty 1

## 2021-03-21 NOTE — Progress Notes (Signed)
PHARMACY - PHYSICIAN COMMUNICATION CRITICAL VALUE ALERT - BLOOD CULTURE IDENTIFICATION (BCID)  Laurie Pugh is an 87 y.o. female who presented to [facility] on 03/20/2021 with a chief complaint of severe sepsis secondary to community-acquired pneumonia/possible aspiration pneumonia.  Assessment:  1 aerobic bottle of 1 set growing gram variable rod, nothing identified on BCID.  Name of physician (or Provider) Contacted: Audrea Muscat, NP  Current antibiotics: Ceftriaxone/Doxycycline  Changes to prescribed antibiotics recommended:  Recommendations accepted by provider - continue current antibiotics and f/u updated culture information as it becomes available.  Results for orders placed or performed during the hospital encounter of 03/20/21  Blood Culture ID Panel (Reflexed) (Collected: 03/21/2021 12:24 AM)  Result Value Ref Range   Enterococcus faecalis NOT DETECTED NOT DETECTED   Enterococcus Faecium NOT DETECTED NOT DETECTED   Listeria monocytogenes NOT DETECTED NOT DETECTED   Staphylococcus species NOT DETECTED NOT DETECTED   Staphylococcus aureus (BCID) NOT DETECTED NOT DETECTED   Staphylococcus epidermidis NOT DETECTED NOT DETECTED   Staphylococcus lugdunensis NOT DETECTED NOT DETECTED   Streptococcus species NOT DETECTED NOT DETECTED   Streptococcus agalactiae NOT DETECTED NOT DETECTED   Streptococcus pneumoniae NOT DETECTED NOT DETECTED   Streptococcus pyogenes NOT DETECTED NOT DETECTED   A.calcoaceticus-baumannii NOT DETECTED NOT DETECTED   Bacteroides fragilis NOT DETECTED NOT DETECTED   Enterobacterales NOT DETECTED NOT DETECTED   Enterobacter cloacae complex NOT DETECTED NOT DETECTED   Escherichia coli NOT DETECTED NOT DETECTED   Klebsiella aerogenes NOT DETECTED NOT DETECTED   Klebsiella oxytoca NOT DETECTED NOT DETECTED   Klebsiella pneumoniae NOT DETECTED NOT DETECTED   Proteus species NOT DETECTED NOT DETECTED   Salmonella species NOT DETECTED NOT DETECTED    Serratia marcescens NOT DETECTED NOT DETECTED   Haemophilus influenzae NOT DETECTED NOT DETECTED   Neisseria meningitidis NOT DETECTED NOT DETECTED   Pseudomonas aeruginosa NOT DETECTED NOT DETECTED   Stenotrophomonas maltophilia NOT DETECTED NOT DETECTED   Candida albicans NOT DETECTED NOT DETECTED   Candida auris NOT DETECTED NOT DETECTED   Candida glabrata NOT DETECTED NOT DETECTED   Candida krusei NOT DETECTED NOT DETECTED   Candida parapsilosis NOT DETECTED NOT DETECTED   Candida tropicalis NOT DETECTED NOT DETECTED   Cryptococcus neoformans/gattii NOT DETECTED NOT DETECTED    Loralee Pacas, PharmD, BCPS Pharmacy: [phone] 03/21/2021  7:53 PM

## 2021-03-21 NOTE — Progress Notes (Signed)
PROGRESS NOTE    Laurie Pugh  IOE:[id number] DOB: 1932-03-04 DOA: 03/20/2021 PCP: Crist Infante, MD   Brief Narrative:  HPI: Laurie Pugh is a 87 y.o. female with medical history significant of paroxysmal atrial fibrillation, coronary artery disease, GERD, esophageal stricture, essential hypertension, hyperlipidemia, PSVT, who is currently on Eliquis who was brought in by her daughter who noted that she was confused this morning.  She complained of epigastric pain in the ER and shortness of breath.  Denied any fever or sick contact.  Patient was evaluated and found to have evidence of pneumonia.  She is not on oxygen.Marland Kitchen  She was COVID-19 negative but chest x-ray confirming pneumonia so patient being admitted to the hospital with community-acquired pneumonia and metabolic encephalopathy as a result.   ED Course: Temperature 99.3 blood pressure 145/85.  Assessment & Plan:   Principal Problem:   Pneumonia Active Problems:   Atrial fibrillation ([place])   Essential hypertension   Esophageal stricture   Chronic anticoagulation  Severe sepsis secondary to community-acquired pneumonia/possible aspiration pneumonia: Patient met severe sepsis criteria based on tachypnea, hypoxia and leukocytosis.  Patient feels much better.  Requiring 2 L of oxygen.  Could not hear any rhonchi on the lung exam.  Continue Rocephin and doxycycline.  Wonder if she has some pulmonary edema as well.  We will check transthoracic echo to check ejection fraction.  Acute metabolic encephalopathy: Secondary to pneumonia and hypoxia.  Resolved.  She is fully alert and oriented.  Her daughter was at the bedside, she is back to baseline.  She in fact wants to go home.  She is having very pleasant and frank conversation today.   essential hypertension: Slightly limited.  Resume home medications which include atenolol, chlorthalidone and Lotensin.   atrial fibrillation: Rate is controlled.  Continue Eliquis.    esophageal stricture: Continue PPIs   GERD: Continue PPI.   hyperlipidemia: Resume Crestor.   chronic pain syndrome: Has been on Percocet at home.  Will resume.  DVT prophylaxis: apixaban (ELIQUIS) tablet 2.5 mg Start: 03/21/21 0045   Code Status: Full Code  Family Communication: Daughter present at bedside.  Plan of care discussed with both of them.  They verbalized understanding.  Status is: Inpatient  Remains inpatient appropriate because:Inpatient level of care appropriate due to severity of illness  Dispo: The patient is from: Home              Anticipated d/c is to: Home              Patient currently is not medically stable to d/c.   Difficult to place patient No        Estimated body mass index is 21.26 kg/m as calculated from the following:   Height as of this encounter: 5' 3"  (1.6 m).   Weight as of this encounter: 54.4 kg.      Nutritional status:               Consultants:  None  Procedures:  None  Antimicrobials:  Anti-infectives (From admission, onward)    Start     Dose/Rate Route Frequency Ordered Stop   03/21/21 1800  doxycycline (VIBRAMYCIN) 100 mg in sodium chloride 0.9 % 250 mL IVPB        100 mg 125 mL/hr over 120 Minutes Intravenous Every 12 hours 03/20/21 2224     03/21/21 1700  cefTRIAXone (ROCEPHIN) 2 g in sodium chloride 0.9 % 100 mL IVPB  2 g 200 mL/hr over 30 Minutes Intravenous Every 24 hours 03/20/21 2224 03/26/21 1659   03/20/21 1715  cefTRIAXone (ROCEPHIN) 1 g in sodium chloride 0.9 % 100 mL IVPB        1 g 200 mL/hr over 30 Minutes Intravenous  Once 03/20/21 1701 03/20/21 1919   03/20/21 1715  azithromycin (ZITHROMAX) 500 mg in sodium chloride 0.9 % 250 mL IVPB        500 mg 250 mL/hr over 60 Minutes Intravenous  Once 03/20/21 1701 03/20/21 1919          Subjective: Seen and examined.  Daughter at the bedside.  Patient fully alert and oriented and denies any complaint but she is on 2 L of  oxygen.  Objective: Vitals:   03/21/21 0626 03/21/21 0936 03/21/21 1033 03/21/21 1319  BP: (!) 155/81 (!) 146/92 (!) 165/83 (!) 141/83  Pulse: 79 98 97 83  Resp: 18 15  (!) 21  Temp: 97.7 F (36.5 C) 97.9 F (36.6 C)  97.8 F (36.6 C)  TempSrc: Oral Oral  Oral  SpO2: 97% 97%  96%  Weight:      Height:        Intake/Output Summary (Last 24 hours) at 03/21/2021 1350 Last data filed at 03/21/2021 0645 Gross per 24 hour  Intake 2404.76 ml  Output --  Net 2404.76 ml   Filed Weights   03/20/21 1446  Weight: 54.4 kg    Examination:  General exam: Appears calm and comfortable  Respiratory system: Diminished breath sounds at the bases bilaterally, respiratory effort normal. Cardiovascular system: S1 & S2 heard, RRR. No JVD, murmurs, rubs, gallops or clicks. No pedal edema. Gastrointestinal system: Abdomen is nondistended, soft and nontender. No organomegaly or masses felt. Normal bowel sounds heard. Central nervous system: Alert and oriented. No focal neurological deficits. Extremities: Symmetric 5 x 5 power. Skin: No rashes, lesions or ulcers Psychiatry: Judgement and insight appear normal. Mood & affect appropriate.    Data Reviewed: I have personally reviewed following labs and imaging studies  CBC: Recent Labs  Lab 03/20/21 1538 03/21/21 0024  WBC 15.3* 15.6*  NEUTROABS 13.5* 13.3*  HGB 13.6 14.0  HCT 42.6 44.7  MCV 95.1 97.2  PLT 149* 017*   Basic Metabolic Panel: Recent Labs  Lab 03/20/21 1538 03/21/21 0024  NA 142 140  K 4.0 4.5  CL 107 108  CO2 27 23  GLUCOSE 147* 100*  BUN 20 18  CREATININE 0.68 0.48  CALCIUM 8.8* 8.5*   GFR: Estimated Creatinine Clearance: 41.8 mL/min (by C-G formula based on SCr of 0.48 mg/dL). Liver Function Tests: Recent Labs  Lab 03/20/21 1538 03/21/21 0024  AST 308* 195*  ALT 103* 88*  ALKPHOS 84 86  BILITOT 3.5* 4.6*  PROT 6.3* 6.0*  ALBUMIN 3.6 3.3*   Recent Labs  Lab 03/20/21 1627  LIPASE 18   Recent Labs   Lab 03/20/21 1627  AMMONIA 23   Coagulation Profile: Recent Labs  Lab 03/20/21 1534  INR 1.4*   Cardiac Enzymes: No results for input(s): CKTOTAL, CKMB, CKMBINDEX, TROPONINI in the last 168 hours. BNP (last 3 results) No results for input(s): PROBNP in the last 8760 hours. HbA1C: No results for input(s): HGBA1C in the last 72 hours. CBG: Recent Labs  Lab 03/20/21 1600  GLUCAP 144*   Lipid Profile: No results for input(s): CHOL, HDL, LDLCALC, TRIG, CHOLHDL, LDLDIRECT in the last 72 hours. Thyroid Function Tests: No results for input(s): TSH, T4TOTAL, FREET4, T3FREE,  THYROIDAB in the last 72 hours. Anemia Panel: No results for input(s): VITAMINB12, FOLATE, FERRITIN, TIBC, IRON, RETICCTPCT in the last 72 hours. Sepsis Labs: Recent Labs  Lab 03/20/21 1538  LATICACIDVEN 1.5    Recent Results (from the past 240 hour(s))  Resp Panel by RT-PCR (Flu A&B, Covid) Nasopharyngeal Swab     Status: None   Collection Time: 03/20/21  3:38 PM   Specimen: Nasopharyngeal Swab; Nasopharyngeal(NP) swabs in vial transport medium  Result Value Ref Range Status   SARS Coronavirus 2 by RT PCR NEGATIVE NEGATIVE Final    Comment: (NOTE) SARS-CoV-2 target nucleic acids are NOT DETECTED.  The SARS-CoV-2 RNA is generally detectable in upper respiratory specimens during the acute phase of infection. The lowest concentration of SARS-CoV-2 viral copies this assay can detect is 138 copies/mL. A negative result does not preclude SARS-Cov-2 infection and should not be used as the sole basis for treatment or other patient management decisions. A negative result may occur with  improper specimen collection/handling, submission of specimen other than nasopharyngeal swab, presence of viral mutation(s) within the areas targeted by this assay, and inadequate number of viral copies(<138 copies/mL). A negative result must be combined with clinical observations, patient history, and  epidemiological information. The expected result is Negative.  Fact Sheet for Patients:  EntrepreneurPulse.com.au  Fact Sheet for Healthcare Providers:  IncredibleEmployment.be  This test is no t yet approved or cleared by the [place] FDA and  has been authorized for detection and/or diagnosis of SARS-CoV-2 by FDA under an Emergency Use Authorization (EUA). This EUA will remain  in effect (meaning this test can be used) for the duration of the COVID-19 declaration under Section 564(b)(1) of the Act, 21 U.S.C.section 360bbb-3(b)(1), unless the authorization is terminated  or revoked sooner.       Influenza A by PCR NEGATIVE NEGATIVE Final   Influenza B by PCR NEGATIVE NEGATIVE Final    Comment: (NOTE) The Xpert Xpress SARS-CoV-2/FLU/RSV plus assay is intended as an aid in the diagnosis of influenza from Nasopharyngeal swab specimens and should not be used as a sole basis for treatment. Nasal washings and aspirates are unacceptable for Xpert Xpress SARS-CoV-2/FLU/RSV testing.  Fact Sheet for Patients: EntrepreneurPulse.com.au  Fact Sheet for Healthcare Providers: IncredibleEmployment.be  This test is not yet approved or cleared by the [place] FDA and has been authorized for detection and/or diagnosis of SARS-CoV-2 by FDA under an Emergency Use Authorization (EUA). This EUA will remain in effect (meaning this test can be used) for the duration of the COVID-19 declaration under Section 564(b)(1) of the Act, 21 U.S.C. section 360bbb-3(b)(1), unless the authorization is terminated or revoked.  Performed at [facility], [place] [address] [place], [place] [address]       Radiology Studies: CT Head Wo Contrast  Result Date: 03/20/2021 CLINICAL DATA:  Altered mental status for 2 days EXAM: CT HEAD WITHOUT CONTRAST TECHNIQUE: Contiguous axial images were obtained from the base  of the skull through the vertex without intravenous contrast. COMPARISON:  06/21/2017 FINDINGS: Brain: No evidence of acute infarction, hemorrhage, hydrocephalus, extra-axial collection or mass lesion/mass effect. Mild periventricular white matter hypodensity and global cerebral volume loss. Vascular: No hyperdense vessel or unexpected calcification. Skull: Normal. Negative for fracture or focal lesion. Sinuses/Orbits: No acute finding. Other: None. IMPRESSION: No acute intracranial pathology. Small-vessel white matter disease and global cerebral volume loss in keeping with advanced patient age. Electronically Signed   By: Eddie Candle M.D.   On: 03/20/2021  16:26   CT ABDOMEN PELVIS W CONTRAST  Result Date: 03/20/2021 CLINICAL DATA:  Epigastric pain. EXAM: CT ABDOMEN AND PELVIS WITH CONTRAST TECHNIQUE: Multidetector CT imaging of the abdomen and pelvis was performed using the standard protocol following bolus administration of intravenous contrast. CONTRAST:  19m OMNIPAQUE IOHEXOL 300 MG/ML  SOLN COMPARISON:  CT abdomen pelvis dated July 01, 2019. FINDINGS: Lower chest: Mild ground-glass density in the right greater than left lower lobes. Trace right pleural effusion. Hepatobiliary: No focal liver abnormality. Multiple tiny gallstones again noted. Asymmetric ill-defined gallbladder wall thickening adjacent to the liver (series 2, image 32). No surrounding inflammatory changes. No biliary dilatation. Pancreas: Unremarkable. No pancreatic ductal dilatation or surrounding inflammatory changes. Spleen: Normal in size without focal abnormality. Adrenals/Urinary Tract: The adrenal glands are unremarkable. Slight interval increase in size of the 3.0 cm left renal cyst. Other subcentimeter low-density lesions in both kidneys are too small to characterize. No renal calculi or hydronephrosis. Punctate calculus in the bladder. Stomach/Bowel: Unchanged large hiatal hernia. No obstruction or bowel wall thickening.  Vascular/Lymphatic: Aortic atherosclerosis. No enlarged abdominal or pelvic lymph nodes. Reproductive: Status post hysterectomy. No adnexal masses. Other: Interval increase in size of the heterogeneous fat containing mass in the presacral space, currently measuring 3.1 x 3.6 cm (series 2, image 59), previously 2.3 x 2.7 cm. No free fluid or pneumoperitoneum. Musculoskeletal: No acute or significant osseous findings. Chronic L1 compression deformity. IMPRESSION: 1. Cholelithiasis. Asymmetric ill-defined gallbladder wall thickening adjacent to the liver. Further evaluation with right upper quadrant ultrasound is recommended to exclude acute cholecystitis. 2. Interval increase in size of the heterogeneous fat containing mass in the presacral space, currently measuring 3.1 x 3.6 cm, previously 2.3 x 2.7 cm. Differential considerations include myelolipoma or liposarcoma. 3. Mild ground-glass density in the right greater than left lower lobes, concerning for aspiration in the setting of a large hiatal hernia. Trace right pleural effusion. 4. Punctate bladder calculus. 5. Aortic Atherosclerosis (ICD10-I70.0). Electronically Signed   By: WTitus DubinM.D.   On: 03/20/2021 18:39   DG Chest Port 1 View  Result Date: 03/20/2021 CLINICAL DATA:  Altered mental status. EXAM: PORTABLE CHEST 1 VIEW COMPARISON:  Jan 29, 2017 FINDINGS: Mild to moderate severity diffusely increased interstitial lung markings are seen. Mild atelectasis and/or infiltrate is noted within the bilateral lung bases. There is no evidence of a pleural effusion or pneumothorax. The cardiac silhouette is mildly enlarged. Marked severity calcification of the aortic arch is seen. There is a large hiatal hernia. The visualized skeletal structures are unremarkable. IMPRESSION: 1. Cardiomegaly and diffusely increased interstitial lung markings which are likely, in part, chronic in nature. A superimposed component of interstitial edema cannot be excluded. 2.  Mild bibasilar atelectasis and/or infiltrate. 3. Large hiatal. Electronically Signed   By: TVirgina NorfolkM.D.   On: 03/20/2021 16:47   VAS [place]OWER EXTREMITY VENOUS (DVT) (ONLY [facility] & [facility])  Result Date: 03/21/2021  Lower Venous DVT Study Patient Name:  CREYAH STREETER Date of Exam:   03/20/2021 Medical Rec #: [id number]         Accession #:    [id number] of Birth: 112-Sep-1933        Patient Gender: F Patient Age:   88Y [facility]:      VAS [place]OWER EXTREMITY VENOUS (DVT) Referring Phys: [id number] CobbBADALAMENTE --------------------------------------------------------------------------------  Indications: Swelling.  Comparison Study: Prior negative Right LEV done 01/05/21 Performing Technologist: CHal Hope  Mauro Kaufmann RVS  Examination Guidelines: A complete evaluation includes B-mode imaging, spectral Doppler, color Doppler, and power Doppler as needed of all accessible portions of each vessel. Bilateral testing is considered an integral part of a complete examination. Limited examinations for reoccurring indications may be performed as noted. The reflux portion of the exam is performed with the patient in reverse Trendelenburg.  +---------+---------------+---------+-----------+----------+--------------+ RIGHT    CompressibilityPhasicitySpontaneityPropertiesThrombus Aging +---------+---------------+---------+-----------+----------+--------------+ CFV      Full           Yes      Yes                                 +---------+---------------+---------+-----------+----------+--------------+ SFJ      Full                                                        +---------+---------------+---------+-----------+----------+--------------+ FV Prox  Full                                                        +---------+---------------+---------+-----------+----------+--------------+ FV Mid   Full                                                         +---------+---------------+---------+-----------+----------+--------------+ FV DistalFull                                                        +---------+---------------+---------+-----------+----------+--------------+ PFV      Full                                                        +---------+---------------+---------+-----------+----------+--------------+ POP      Full           Yes      Yes                                 +---------+---------------+---------+-----------+----------+--------------+ PTV      Full                                                        +---------+---------------+---------+-----------+----------+--------------+ PERO     Full                                                        +---------+---------------+---------+-----------+----------+--------------+   +----+---------------+---------+-----------+----------+--------------+  LEFTCompressibilityPhasicitySpontaneityPropertiesThrombus Aging +----+---------------+---------+-----------+----------+--------------+ CFV Full           Yes      Yes                                 +----+---------------+---------+-----------+----------+--------------+     Summary: RIGHT: - Findings appear essentially unchanged compared to previous examination. - There is no evidence of deep vein thrombosis in the lower extremity.  LEFT: - No evidence of common femoral vein obstruction.  *See table(s) above for measurements and observations. Electronically signed by Servando Snare MD on 03/21/2021 at 12:25:40 PM.    Final    US Abdomen Limited RUQ (LIVER/GB)  Result Date: 03/20/2021 CLINICAL DATA:  Cholelithiasis. EXAM: ULTRASOUND ABDOMEN LIMITED RIGHT UPPER QUADRANT COMPARISON:  None. FINDINGS: Gallbladder: A 1.4 cm lobulated echogenic area is seen within the dependent portion of the gallbladder lumen. A mild amount of surrounding heterogeneous gallbladder sludge is also noted. The gallbladder wall measures  2.6 mm in thickness. No sonographic Murphy sign noted by sonographer. Common bile duct: Diameter: 3.6 mm Liver: No focal lesion identified. Mild, diffusely increased echogenicity of the liver parenchyma is noted. Portal vein is patent on color Doppler imaging with normal direction of blood flow towards the liver. Other: Of incidental note is the presence of a small right-sided pleural effusion. IMPRESSION: 1. Gallbladder sludge and suspected cholelithiasis, without acute cholecystitis. 2. Fatty liver. Electronically Signed   By: Virgina Norfolk M.D.   On: 03/20/2021 20:19    Scheduled Meds:  apixaban  2.5 mg Oral BID   atenolol  25 mg Oral Daily   benazepril  20 mg Oral Daily   gabapentin  100 mg Oral TID   pantoprazole  40 mg Oral Daily   rosuvastatin  10 mg Oral Daily   Continuous Infusions:  sodium chloride 1,000 mL (03/21/21 1336)   cefTRIAXone (ROCEPHIN)  IV     doxycycline (VIBRAMYCIN) IV       LOS: 1 day   Time spent: 36 minutes   Darliss Cheney, MD [facility]  03/21/2021, 1:50 PM   How to contact the [facility] Attending or Consulting provider [place] or covering provider during after hours [place], for this patient?  Check the care team in [facility] and look for a) attending/consulting [facility] provider listed and b) the [facility] team listed. Page or secure chat [phone]. Log into [email or web address] and use [place]'s universal password to access. If you do not have the password, please contact the hospital operator. Locate the [facility] provider you are looking for under [facility] and page to a number that you can be directly reached. If you still have difficulty reaching the provider, please page the [facility] (Director on Call) for the Hospitalists listed on amion for assistance.

## 2021-03-21 NOTE — Evaluation (Signed)
Physical Therapy Evaluation Patient Details Name: Laurie Pugh MRN: [id number] DOB: [birth year] Today's Date: 03/21/2021   History of Present Illness  Pt admitted from home with AMS and dx with severe sepsis 2* PNA.  Pt with hx of a-fib, CAD, and lumbar vertebroplasty in 2020.  Clinical Impression  Pt admitted as above and presenting with functional mobility limitations 2* generalized weakness and mild ambulatory balance deficits.  Pt hopes to progress to dc home with intermittent assist of family.    Follow Up Recommendations No PT follow up    Equipment Recommendations  None recommended by PT    Recommendations for Other Services OT consult     Precautions / Restrictions Precautions Precautions: Fall Restrictions Weight Bearing Restrictions: No      Mobility  Bed Mobility Overal bed mobility: Needs Assistance Bed Mobility: Supine to Sit     Supine to sit: Supervision     General bed mobility comments: Increased time with use of bed rail    Transfers Overall transfer level: Needs assistance Equipment used: None Transfers: Sit to/from Stand Sit to Stand: Min guard         General transfer comment: steady assist  Ambulation/Gait Ambulation/Gait assistance: Min assist;Min guard Gait Distance (Feet): 300 Feet Assistive device: None;1 person hand held assist Gait Pattern/deviations: Step-through pattern;Decreased step length - right;Decreased step length - left;Shuffle;Trunk flexed;Wide base of support     General Gait Details: Pt ambulated 150 requiring min HHA for stability but progressing to ambulate additional 150' with min guard assist.  Stairs            Wheelchair Mobility    Modified Rankin (Stroke Patients Only)       Balance Overall balance assessment: Needs assistance Sitting-balance support: Feet supported;No upper extremity supported Sitting balance-Leahy Scale: Good     Standing balance support: No upper extremity  supported Standing balance-Leahy Scale: Fair                               Pertinent Vitals/Pain Pain Assessment: No/denies pain    Home Living Family/patient expects to be discharged to:: Private residence Living Arrangements: Alone Available Help at Discharge: Family;Available PRN/intermittently Type of Home: Mobile home Home Access: Stairs to enter Entrance Stairs-Rails: Right;Left;Can reach both Entrance Stairs-Number of Steps: 4 Home Layout: One level Home Equipment: None Additional Comments: Pt thinks family member has some equipment available    Prior Function Level of Independence: Independent         Comments: Pt lives alone and drives     Hand Dominance        Extremity/Trunk Assessment   Upper Extremity Assessment Upper Extremity Assessment: Generalized weakness    Lower Extremity Assessment Lower Extremity Assessment: Generalized weakness       Communication   Communication: No difficulties  Cognition Arousal/Alertness: Awake/alert Behavior During Therapy: WFL for tasks assessed/performed Overall Cognitive Status: No family/caregiver present to determine baseline cognitive functioning                                 General Comments: Pt alert and oriented x 4 but easily confused if too much information provided      General Comments      Exercises     Assessment/Plan    PT Assessment Patient needs continued PT services  PT Problem List Decreased strength;Decreased activity tolerance;Decreased balance;Decreased mobility;Decreased knowledge  of use of DME       PT Treatment Interventions DME instruction;Gait training;Stair training;Functional mobility training;Therapeutic activities;Patient/family education    PT Goals (Current goals can be found in the Care Plan section)  Acute Rehab PT Goals Patient Stated Goal: Regain IND and go home PT Goal Formulation: With patient Time For Goal Achievement:  04/04/21 Potential to Achieve Goals: Good    Frequency Min 3X/week   Barriers to discharge Decreased caregiver support Does not have 24/7    Co-evaluation               AM-PAC PT "6 Clicks" Mobility  Outcome Measure Help needed turning from your back to your side while in a flat bed without using bedrails?: None Help needed moving from lying on your back to sitting on the side of a flat bed without using bedrails?: None Help needed moving to and from a bed to a chair (including a wheelchair)?: A Little Help needed standing up from a chair using your arms (e.g., wheelchair or bedside chair)?: A Little Help needed to walk in hospital room?: A Little Help needed climbing 3-5 steps with a railing? : A Little 6 Click Score: 20    End of Session Equipment Utilized During Treatment: Gait belt;Oxygen Activity Tolerance: Patient tolerated treatment well Patient left: in chair;with call bell/phone within reach;with chair alarm set Nurse Communication: Mobility status PT Visit Diagnosis: Unsteadiness on feet (R26.81);Muscle weakness (generalized) (M62.81);Difficulty in walking, not elsewhere classified (R26.2)    Time: 9373-4287 PT Time Calculation (min) (ACUTE ONLY): 30 min   Charges:   PT Evaluation $PT Eval Low Complexity: 1 Low PT Treatments $Gait Training: 8-22 mins        Mauro Kaufmann PT Acute Rehabilitation Services Pager [phone] Office [phone]   Laurie Pugh 03/21/2021, 3:44 PM

## 2021-03-22 ENCOUNTER — Inpatient Hospital Stay (HOSPITAL_COMMUNITY): Payer: Medicare Other

## 2021-03-22 DIAGNOSIS — I361 Nonrheumatic tricuspid (valve) insufficiency: Secondary | ICD-10-CM

## 2021-03-22 DIAGNOSIS — I351 Nonrheumatic aortic (valve) insufficiency: Secondary | ICD-10-CM

## 2021-03-22 LAB — ECHOCARDIOGRAM COMPLETE
Area-P 1/2: 12.24 cm2
Height: 63 in
MV VTI: 2 cm2
P 1/2 time: 694 msec
S' Lateral: 1.7 cm
Weight: 1920 oz

## 2021-03-22 LAB — URINE CULTURE

## 2021-03-22 LAB — PROCALCITONIN: Procalcitonin: 1.06 ng/mL

## 2021-03-22 LAB — CULTURE, BLOOD (ROUTINE X 2): Special Requests: ADEQUATE

## 2021-03-22 MED ORDER — FUROSEMIDE 10 MG/ML IJ SOLN
40.0000 mg | Freq: Two times a day (BID) | INTRAMUSCULAR | Status: DC
  Administered 2021-03-22 – 2021-03-25 (×8): 40 mg via INTRAVENOUS
  Filled 2021-03-22 (×8): qty 4

## 2021-03-22 MED ORDER — MELATONIN 3 MG PO TABS
3.0000 mg | ORAL_TABLET | Freq: Every day | ORAL | Status: DC
  Administered 2021-03-23 – 2021-03-27 (×6): 3 mg via ORAL
  Filled 2021-03-22 (×6): qty 1

## 2021-03-22 MED ORDER — DOXYCYCLINE HYCLATE 100 MG PO TABS
100.0000 mg | ORAL_TABLET | Freq: Two times a day (BID) | ORAL | Status: DC
  Administered 2021-03-22 – 2021-03-28 (×13): 100 mg via ORAL
  Filled 2021-03-22 (×13): qty 1

## 2021-03-22 NOTE — Progress Notes (Signed)
PROGRESS NOTE    Laurie Pugh  HDQ:[id number] DOB: March 23, 1932 DOA: 03/20/2021 PCP: Crist Infante, MD   Brief Narrative:  HPI: Laurie Pugh is a 87 y.o. female with medical history significant of paroxysmal atrial fibrillation, coronary artery disease, GERD, esophageal stricture, essential hypertension, hyperlipidemia, PSVT, who is currently on Eliquis who was brought in by her daughter who noted that she was confused this morning.  She complained of epigastric pain in the ER and shortness of breath.  Denied any fever or sick contact.  Patient was evaluated and found to have evidence of pneumonia.  She is not on oxygen.Marland Kitchen  She was COVID-19 negative but chest x-ray confirming pneumonia so patient being admitted to the hospital with community-acquired pneumonia and metabolic encephalopathy as a result.   ED Course: Temperature 99.3 blood pressure 145/85.  Assessment & Plan:   Principal Problem:   Pneumonia Active Problems:   Atrial fibrillation ([place])   Essential hypertension   Esophageal stricture   Chronic anticoagulation  Severe sepsis secondary to community-acquired pneumonia/possible aspiration pneumonia: Patient met severe sepsis criteria based on tachypnea, hypoxia and leukocytosis.  Patient now is complaining of shortness of breath, requiring 3 L of oxygen.  On examination has crackles and rhonchi.  I wonder if she also has component of acute pulmonary edema and congestive heart failure.  Echo ordered yesterday and is still pending.  I will start her on Lasix 40 mg IV twice daily.  Continue current antibiotics.  Acute metabolic encephalopathy: Secondary to pneumonia and hypoxia.  Resolved.  She is fully alert and oriented.     essential hypertension: Slightly elevated.  Continue home medications which include atenolol, chlorthalidone and Lotensin.   atrial fibrillation: Rate is controlled.  Continue Eliquis.   esophageal stricture: Continue PPIs   GERD: Continue PPI.    hyperlipidemia: Resume Crestor.   chronic pain syndrome: Has been on Percocet at home.  Continue.  DVT prophylaxis: apixaban (ELIQUIS) tablet 2.5 mg Start: 03/21/21 0045   Code Status: Full Code  Family Communication: None present at bedside.  Plan of care discussed with the patient and then with her daughter Santiago Glad over the phone.  Status is: Inpatient  Remains inpatient appropriate because:Inpatient level of care appropriate due to severity of illness  Dispo: The patient is from: Home              Anticipated d/c is to: Home              Patient currently is not medically stable to d/c.   Difficult to place patient No        Estimated body mass index is 21.26 kg/m as calculated from the following:   Height as of this encounter: 5' 3"  (1.6 m).   Weight as of this encounter: 54.4 kg.      Nutritional status:               Consultants:  None  Procedures:  None  Antimicrobials:  Anti-infectives (From admission, onward)    Start     Dose/Rate Route Frequency Ordered Stop   03/21/21 1800  doxycycline (VIBRAMYCIN) 100 mg in sodium chloride 0.9 % 250 mL IVPB        100 mg 125 mL/hr over 120 Minutes Intravenous Every 12 hours 03/20/21 2224     03/21/21 1700  cefTRIAXone (ROCEPHIN) 2 g in sodium chloride 0.9 % 100 mL IVPB        2 g 200 mL/hr over 30 Minutes Intravenous Every  24 hours 03/20/21 2224 03/26/21 1659   03/20/21 1715  cefTRIAXone (ROCEPHIN) 1 g in sodium chloride 0.9 % 100 mL IVPB        1 g 200 mL/hr over 30 Minutes Intravenous  Once 03/20/21 1701 03/20/21 1919   03/20/21 1715  azithromycin (ZITHROMAX) 500 mg in sodium chloride 0.9 % 250 mL IVPB        500 mg 250 mL/hr over 60 Minutes Intravenous  Once 03/20/21 1701 03/20/21 1919          Subjective: Seen and examined.  Complains of shortness of breath, more than yesterday.  No other complaint.  Fully alert and oriented  Objective: Vitals:   03/21/21 1319 03/21/21 2152 03/22/21 0518  03/22/21 1034  BP: (!) 141/83 (!) 151/78 (!) 153/90 (!) 159/88  Pulse: 83 83 (!) 103   Resp: (!) 21 18 20    Temp: 97.8 F (36.6 C) 97.7 F (36.5 C) 97.7 F (36.5 C)   TempSrc: Oral Oral Oral   SpO2: 96% 96% 94%   Weight:      Height:        Intake/Output Summary (Last 24 hours) at 03/22/2021 1107 Last data filed at 03/22/2021 1025 Gross per 24 hour  Intake 1473.01 ml  Output 500 ml  Net 973.01 ml    Filed Weights   03/20/21 1446  Weight: 54.4 kg    Examination:  General exam: Appears calm and comfortable  Respiratory system: Bilateral rhonchi and crackles. Respiratory effort normal. Cardiovascular system: S1 & S2 heard, RRR. No JVD, murmurs, rubs, gallops or clicks. No pedal edema. Gastrointestinal system: Abdomen is nondistended, soft and nontender. No organomegaly or masses felt. Normal bowel sounds heard. Central nervous system: Alert and oriented. No focal neurological deficits. Extremities: Symmetric 5 x 5 power. Skin: No rashes, lesions or ulcers.  Psychiatry: Judgement and insight appear normal. Mood & affect appropriate.   Data Reviewed: I have personally reviewed following labs and imaging studies  CBC: Recent Labs  Lab 03/20/21 1538 03/21/21 0024  WBC 15.3* 15.6*  NEUTROABS 13.5* 13.3*  HGB 13.6 14.0  HCT 42.6 44.7  MCV 95.1 97.2  PLT 149* 135*    Basic Metabolic Panel: Recent Labs  Lab 03/20/21 1538 03/21/21 0024  NA 142 140  K 4.0 4.5  CL 107 108  CO2 27 23  GLUCOSE 147* 100*  BUN 20 18  CREATININE 0.68 0.48  CALCIUM 8.8* 8.5*    GFR: Estimated Creatinine Clearance: 41.8 mL/min (by C-G formula based on SCr of 0.48 mg/dL). Liver Function Tests: Recent Labs  Lab 03/20/21 1538 03/21/21 0024  AST 308* 195*  ALT 103* 88*  ALKPHOS 84 86  BILITOT 3.5* 4.6*  PROT 6.3* 6.0*  ALBUMIN 3.6 3.3*    Recent Labs  Lab 03/20/21 1627  LIPASE 18    Recent Labs  Lab 03/20/21 1627  AMMONIA 23    Coagulation Profile: Recent Labs   Lab 03/20/21 1534  INR 1.4*    Cardiac Enzymes: No results for input(s): CKTOTAL, CKMB, CKMBINDEX, TROPONINI in the last 168 hours. BNP (last 3 results) No results for input(s): PROBNP in the last 8760 hours. HbA1C: No results for input(s): HGBA1C in the last 72 hours. CBG: Recent Labs  Lab 03/20/21 1600  GLUCAP 144*    Lipid Profile: No results for input(s): CHOL, HDL, LDLCALC, TRIG, CHOLHDL, LDLDIRECT in the last 72 hours. Thyroid Function Tests: No results for input(s): TSH, T4TOTAL, FREET4, T3FREE, THYROIDAB in the last 72 hours. Anemia  Panel: No results for input(s): VITAMINB12, FOLATE, FERRITIN, TIBC, IRON, RETICCTPCT in the last 72 hours. Sepsis Labs: Recent Labs  Lab 03/20/21 1538  LATICACIDVEN 1.5     Recent Results (from the past 240 hour(s))  Urine culture     Status: Abnormal   Collection Time: 03/20/21  3:38 PM   Specimen: Urine, Random  Result Value Ref Range Status   Specimen Description   Final    URINE, RANDOM Performed at [facility] [address], [place], [place] [address]    Special Requests   Final    NONE Performed at [facility], [place] [address] [place], [place] [address]    Culture MULTIPLE SPECIES PRESENT, SUGGEST RECOLLECTION (A)  Final   Report Status 03/22/2021 FINAL  Final  Resp Panel by RT-PCR (Flu A&B, Covid) Nasopharyngeal Swab     Status: None   Collection Time: 03/20/21  3:38 PM   Specimen: Nasopharyngeal Swab; Nasopharyngeal(NP) swabs in vial transport medium  Result Value Ref Range Status   SARS Coronavirus 2 by RT PCR NEGATIVE NEGATIVE Final    Comment: (NOTE) SARS-CoV-2 target nucleic acids are NOT DETECTED.  The SARS-CoV-2 RNA is generally detectable in upper respiratory specimens during the acute phase of infection. The lowest concentration of SARS-CoV-2 viral copies this assay can detect is 138 copies/mL. A negative result does not preclude SARS-Cov-2 infection and should  not be used as the sole basis for treatment or other patient management decisions. A negative result may occur with  improper specimen collection/handling, submission of specimen other than nasopharyngeal swab, presence of viral mutation(s) within the areas targeted by this assay, and inadequate number of viral copies(<138 copies/mL). A negative result must be combined with clinical observations, patient history, and epidemiological information. The expected result is Negative.  Fact Sheet for Patients:  EntrepreneurPulse.com.au  Fact Sheet for Healthcare Providers:  IncredibleEmployment.be  This test is no t yet approved or cleared by the [place] FDA and  has been authorized for detection and/or diagnosis of SARS-CoV-2 by FDA under an Emergency Use Authorization (EUA). This EUA will remain  in effect (meaning this test can be used) for the duration of the COVID-19 declaration under Section 564(b)(1) of the Act, 21 U.S.C.section 360bbb-3(b)(1), unless the authorization is terminated  or revoked sooner.       Influenza A by PCR NEGATIVE NEGATIVE Final   Influenza B by PCR NEGATIVE NEGATIVE Final    Comment: (NOTE) The Xpert Xpress SARS-CoV-2/FLU/RSV plus assay is intended as an aid in the diagnosis of influenza from Nasopharyngeal swab specimens and should not be used as a sole basis for treatment. Nasal washings and aspirates are unacceptable for Xpert Xpress SARS-CoV-2/FLU/RSV testing.  Fact Sheet for Patients: EntrepreneurPulse.com.au  Fact Sheet for Healthcare Providers: IncredibleEmployment.be  This test is not yet approved or cleared by the [place] FDA and has been authorized for detection and/or diagnosis of SARS-CoV-2 by FDA under an Emergency Use Authorization (EUA). This EUA will remain in effect (meaning this test can be used) for the duration of the COVID-19 declaration under  Section 564(b)(1) of the Act, 21 U.S.C. section 360bbb-3(b)(1), unless the authorization is terminated or revoked.  Performed at [facility], [place] [address] [place], [place] [address]   Culture, blood (routine x 2) Call MD if unable to obtain prior to antibiotics being given     Status: None (Preliminary result)   Collection Time: 03/21/21 12:24 AM   Specimen: BLOOD  Result Value Ref Range Status   Specimen Description   Final    BLOOD RIGHT ANTECUBITAL Performed at [facility] [address], [place], [place] [address]    Special Requests   Final    BOTTLES DRAWN AEROBIC ONLY Blood Culture adequate volume Performed at [facility] [address], [place], [place] [address]    Culture   Final    NO GROWTH 1 DAY Performed at [facility], [place] [address] [place], [place] [address]    Report Status PENDING  Incomplete  Culture, blood (routine x 2) Call MD if unable to obtain prior to antibiotics being given     Status: Abnormal (Preliminary result)   Collection Time: 03/21/21 12:24 AM   Specimen: BLOOD  Result Value Ref Range Status   Specimen Description   Final    BLOOD LEFT HAND Performed at [facility] [address], [place], [place] [address]    Special Requests   Final    BOTTLES DRAWN AEROBIC ONLY Blood Culture adequate volume Performed at [facility] [address] [place], [place] [address]    Culture  Setup Time (A)  Final    GRAM VARIABLE ROD AEROBIC BOTTLE ONLY CRITICAL RESULT CALLED TO, READ BACK BY AND VERIFIED WITH: E,WILLIAMSON PHARMD @1946  03/21/21 EB Performed at [facility] [address], [place], [place] [address]    Culture GRAM VARIABLE ROD (A)  Final   Report Status PENDING  Incomplete  Blood Culture ID Panel (Reflexed)     Status: None   Collection Time: 03/21/21 12:24 AM  Result Value Ref Range Status   Enterococcus faecalis NOT  DETECTED NOT DETECTED Final   Enterococcus Faecium NOT DETECTED NOT DETECTED Final   Listeria monocytogenes NOT DETECTED NOT DETECTED Final   Staphylococcus species NOT DETECTED NOT DETECTED Final   Staphylococcus aureus (BCID) NOT DETECTED NOT DETECTED Final   Staphylococcus epidermidis NOT DETECTED NOT DETECTED Final   Staphylococcus lugdunensis NOT DETECTED NOT DETECTED Final   Streptococcus species NOT DETECTED NOT DETECTED Final   Streptococcus agalactiae NOT DETECTED NOT DETECTED Final   Streptococcus pneumoniae NOT DETECTED NOT DETECTED Final   Streptococcus pyogenes NOT DETECTED NOT DETECTED Final   A.calcoaceticus-baumannii NOT DETECTED NOT DETECTED Final   Bacteroides fragilis NOT DETECTED NOT DETECTED Final   Enterobacterales NOT DETECTED NOT DETECTED Final   Enterobacter cloacae complex NOT DETECTED NOT DETECTED Final   Escherichia coli NOT DETECTED NOT DETECTED Final   Klebsiella aerogenes NOT DETECTED NOT DETECTED Final   Klebsiella oxytoca NOT DETECTED NOT DETECTED Final   Klebsiella pneumoniae NOT DETECTED NOT DETECTED Final   Proteus species NOT DETECTED NOT DETECTED Final   Salmonella species NOT DETECTED NOT DETECTED Final   Serratia marcescens NOT DETECTED NOT DETECTED Final   Haemophilus influenzae NOT DETECTED NOT DETECTED Final   Neisseria meningitidis NOT DETECTED NOT DETECTED Final   Pseudomonas aeruginosa NOT DETECTED NOT DETECTED Final   Stenotrophomonas maltophilia NOT DETECTED NOT DETECTED Final   Candida albicans NOT DETECTED NOT DETECTED Final   Candida auris NOT DETECTED NOT DETECTED Final   Candida glabrata NOT DETECTED NOT DETECTED Final   Candida krusei NOT DETECTED NOT DETECTED Final   Candida parapsilosis NOT DETECTED NOT DETECTED Final   Candida tropicalis NOT DETECTED NOT DETECTED Final   Cryptococcus neoformans/gattii NOT DETECTED NOT DETECTED Final    Comment: Performed at [facility], [place]. [place],  [place] [address]        Radiology Studies: CT Head Wo Contrast  Result Date: 03/20/2021 CLINICAL DATA:  Altered mental status for 2 days EXAM: CT HEAD WITHOUT CONTRAST TECHNIQUE: Contiguous axial images were obtained from the base of the skull through the vertex without intravenous contrast. COMPARISON:  06/21/2017 FINDINGS: Brain: No evidence of acute infarction, hemorrhage, hydrocephalus, extra-axial collection or mass lesion/mass effect. Mild periventricular white matter hypodensity and global cerebral volume loss. Vascular: No hyperdense vessel or unexpected calcification. Skull: Normal. Negative for fracture or focal lesion. Sinuses/Orbits: No acute finding. Other: None. IMPRESSION: No acute intracranial pathology. Small-vessel white matter disease and global cerebral volume loss in keeping with advanced patient age. Electronically Signed   By: Eddie Candle M.D.   On: 03/20/2021 16:26   CT ABDOMEN PELVIS W CONTRAST  Result Date: 03/20/2021 CLINICAL DATA:  Epigastric pain. EXAM: CT ABDOMEN AND PELVIS WITH CONTRAST TECHNIQUE: Multidetector CT imaging of the abdomen and pelvis was performed using the standard protocol following bolus administration of intravenous contrast. CONTRAST:  94m OMNIPAQUE IOHEXOL 300 MG/ML  SOLN COMPARISON:  CT abdomen pelvis dated July 01, 2019. FINDINGS: Lower chest: Mild ground-glass density in the right greater than left lower lobes. Trace right pleural effusion. Hepatobiliary: No focal liver abnormality. Multiple tiny gallstones again noted. Asymmetric ill-defined gallbladder wall thickening adjacent to the liver (series 2, image 32). No surrounding inflammatory changes. No biliary dilatation. Pancreas: Unremarkable. No pancreatic ductal dilatation or surrounding inflammatory changes. Spleen: Normal in size without focal abnormality. Adrenals/Urinary Tract: The adrenal glands are unremarkable. Slight interval increase in size of the 3.0 cm left renal cyst. Other subcentimeter low-density  lesions in both kidneys are too small to characterize. No renal calculi or hydronephrosis. Punctate calculus in the bladder. Stomach/Bowel: Unchanged large hiatal hernia. No obstruction or bowel wall thickening. Vascular/Lymphatic: Aortic atherosclerosis. No enlarged abdominal or pelvic lymph nodes. Reproductive: Status post hysterectomy. No adnexal masses. Other: Interval increase in size of the heterogeneous fat containing mass in the presacral space, currently measuring 3.1 x 3.6 cm (series 2, image 59), previously 2.3 x 2.7 cm. No free fluid or pneumoperitoneum. Musculoskeletal: No acute or significant osseous findings. Chronic L1 compression deformity. IMPRESSION: 1. Cholelithiasis. Asymmetric ill-defined gallbladder wall thickening adjacent to the liver. Further evaluation with right upper quadrant ultrasound is recommended to exclude acute cholecystitis. 2. Interval increase in size of the heterogeneous fat containing mass in the presacral space, currently measuring 3.1 x 3.6 cm, previously 2.3 x 2.7 cm. Differential considerations include myelolipoma or liposarcoma. 3. Mild ground-glass density in the right greater than left lower lobes, concerning for aspiration in the setting of a large hiatal hernia. Trace right pleural effusion. 4. Punctate bladder calculus. 5. Aortic Atherosclerosis (ICD10-I70.0). Electronically Signed   By: WTitus DubinM.D.   On: 03/20/2021 18:39   DG Chest Port 1 View  Result Date: 03/20/2021 CLINICAL DATA:  Altered mental status. EXAM: PORTABLE CHEST 1 VIEW COMPARISON:  Jan 29, 2017 FINDINGS: Mild to moderate severity diffusely increased interstitial lung markings are seen. Mild atelectasis and/or infiltrate is noted within the bilateral lung bases. There is no evidence of a pleural effusion or pneumothorax. The cardiac silhouette is mildly enlarged. Marked severity calcification of the aortic arch is seen. There is a large hiatal hernia. The visualized skeletal structures are  unremarkable. IMPRESSION: 1. Cardiomegaly and diffusely increased interstitial lung markings which are likely, in part, chronic in nature. A superimposed component of interstitial edema cannot be excluded. 2. Mild bibasilar  atelectasis and/or infiltrate. 3. Large hiatal. Electronically Signed   By: Virgina Norfolk M.D.   On: 03/20/2021 16:47   VAS [place] LOWER EXTREMITY VENOUS (DVT) (ONLY [facility] & [facility])  Result Date: 03/21/2021  Lower Venous DVT Study Patient Name:  MOZEL BURDETT  Date of Exam:   03/20/2021 Medical Rec #: [id number]          Accession #:    [id number] Date of Birth: [birth year]         Patient Gender: F Patient Age:   88Y Exam Location:  [facility] Procedure:      VAS [place] LOWER EXTREMITY VENOUS (DVT) Referring Phys: [id number] Rudell Cobb BADALAMENTE --------------------------------------------------------------------------------  Indications: Swelling.  Comparison Study: Prior negative Right LEV done 01/05/21 Performing Technologist: Sharion Dove RVS  Examination Guidelines: A complete evaluation includes B-mode imaging, spectral Doppler, color Doppler, and power Doppler as needed of all accessible portions of each vessel. Bilateral testing is considered an integral part of a complete examination. Limited examinations for reoccurring indications may be performed as noted. The reflux portion of the exam is performed with the patient in reverse Trendelenburg.  +---------+---------------+---------+-----------+----------+--------------+ RIGHT    CompressibilityPhasicitySpontaneityPropertiesThrombus Aging +---------+---------------+---------+-----------+----------+--------------+ CFV      Full           Yes      Yes                                 +---------+---------------+---------+-----------+----------+--------------+ SFJ      Full                                                        +---------+---------------+---------+-----------+----------+--------------+ FV Prox  Full                                                         +---------+---------------+---------+-----------+----------+--------------+ FV Mid   Full                                                        +---------+---------------+---------+-----------+----------+--------------+ FV DistalFull                                                        +---------+---------------+---------+-----------+----------+--------------+ PFV      Full                                                        +---------+---------------+---------+-----------+----------+--------------+ POP      Full           Yes      Yes                                 +---------+---------------+---------+-----------+----------+--------------+  PTV      Full                                                        +---------+---------------+---------+-----------+----------+--------------+ PERO     Full                                                        +---------+---------------+---------+-----------+----------+--------------+   +----+---------------+---------+-----------+----------+--------------+ LEFTCompressibilityPhasicitySpontaneityPropertiesThrombus Aging +----+---------------+---------+-----------+----------+--------------+ CFV Full           Yes      Yes                                 +----+---------------+---------+-----------+----------+--------------+     Summary: RIGHT: - Findings appear essentially unchanged compared to previous examination. - There is no evidence of deep vein thrombosis in the lower extremity.  LEFT: - No evidence of common femoral vein obstruction.  *See table(s) above for measurements and observations. Electronically signed by Servando Snare MD on 03/21/2021 at 12:25:40 PM.    Final    US Abdomen Limited RUQ (LIVER/GB)  Result Date: 03/20/2021 CLINICAL DATA:  Cholelithiasis. EXAM: ULTRASOUND ABDOMEN LIMITED RIGHT UPPER QUADRANT COMPARISON:  None. FINDINGS:  Gallbladder: A 1.4 cm lobulated echogenic area is seen within the dependent portion of the gallbladder lumen. A mild amount of surrounding heterogeneous gallbladder sludge is also noted. The gallbladder wall measures 2.6 mm in thickness. No sonographic Murphy sign noted by sonographer. Common bile duct: Diameter: 3.6 mm Liver: No focal lesion identified. Mild, diffusely increased echogenicity of the liver parenchyma is noted. Portal vein is patent on color Doppler imaging with normal direction of blood flow towards the liver. Other: Of incidental note is the presence of a small right-sided pleural effusion. IMPRESSION: 1. Gallbladder sludge and suspected cholelithiasis, without acute cholecystitis. 2. Fatty liver. Electronically Signed   By: Virgina Norfolk M.D.   On: 03/20/2021 20:19    Scheduled Meds:  apixaban  2.5 mg Oral BID   atenolol  25 mg Oral Daily   benazepril  20 mg Oral Daily   furosemide  40 mg Intravenous BID   gabapentin  100 mg Oral TID   pantoprazole  40 mg Oral Daily   rosuvastatin  10 mg Oral Daily   Continuous Infusions:  sodium chloride 75 mL/hr at 03/22/21 0607   cefTRIAXone (ROCEPHIN)  IV 2 g (03/21/21 1629)   doxycycline (VIBRAMYCIN) IV 100 mg (03/22/21 0606)     LOS: 2 days   Time spent: 30 minutes   Darliss Cheney, MD [facility]  03/22/2021, 11:07 AM   How to contact the [facility] Attending or Consulting provider [place] or covering provider during after hours [place], for this patient?  Check the care team in [facility] and look for a) attending/consulting [facility] provider listed and b) the [facility] team listed. Page or secure chat [phone]. Log into [email or web address] and use [place] universal password to access. If you do not have the password, please contact the hospital operator. Locate the [facility] provider you are looking for under [facility] and page to a number that you  can be directly reached. If you still have difficulty reaching the provider, please page the [facility]  (Director on Call) for the Hospitalists listed on amion for assistance.

## 2021-03-22 NOTE — Evaluation (Addendum)
Occupational Therapy Evaluation Patient Details Name: Laurie Pugh MRN: [id number] DOB: 1932/06/05 Today's Date: 03/22/2021    History of Present Illness Pt admitted from home with AMS and dx with severe sepsis 2* PNA.  Pt with hx of a-fib, CAD, and lumbar vertebroplasty in 2020.   Clinical Impression   Pt presents with decline in function and safety with ADLs and ADL mobility with impaired strength, balance and endurance. PTA pt lived at home alone and was Ind with ADLs/selfcare, IADLs, cooking, home mgt, mobility and was driving. Pt currently requires sup to sit EOB, min A with LB ADLs (due to fatigue), min guard A with mobility using RW. Pt with decreasing O2 SATs, and increasing HR with minimal exertion with readings below (RN notified).Pt would benefit from acute OT services to maximize level of function and safety Supine at rest before activity: HR 119, O2 87-90% 2 LO2 With/after activity at EOB and transfer to chair HR 150, O2 79% requiring 8 minutes to recover to 88% with deep breathing and cues not to talk HR and O2 fluctuating throughout session    Follow Up Recommendations  No OT follow up;Supervision - Intermittent    Equipment Recommendations  Tub/shower seat    Recommendations for Other Services       Precautions / Restrictions Precautions Precautions: Fall;Other (comment) Precaution Comments: watch HR and O2 SATs Restrictions Weight Bearing Restrictions: No      Mobility Bed Mobility Overal bed mobility: Needs Assistance Bed Mobility: Supine to Sit     Supine to sit: Supervision     General bed mobility comments: Increased time    Transfers Overall transfer level: Needs assistance Equipment used: None Transfers: Sit to/from Stand Sit to Stand: Min guard              Balance Overall balance assessment: Needs assistance Sitting-balance support: Feet supported;No upper extremity supported Sitting balance-Leahy Scale: Good     Standing  balance support: During functional activity;Bilateral upper extremity supported Standing balance-Leahy Scale: Fair                             ADL either performed or assessed with clinical judgement   ADL Overall ADL's : Needs assistance/impaired Eating/Feeding: Independent;Sitting   Grooming: Wash/dry hands;Wash/dry face;Standing;Min guard   Upper Body Bathing: Set up;Supervision/ safety;Sitting   Lower Body Bathing: Minimal assistance Lower Body Bathing Details (indicate cue type and reason): min A due to pt fatigues easily Upper Body Dressing : Set up;Supervision/safety   Lower Body Dressing: Minimal assistance Lower Body Dressing Details (indicate cue type and reason): min A due to pt fatigues easily Toilet Transfer: Min guard;Stand-pivot;Ambulation;Cueing for safety Toilet Transfer Details (indicate cue type and reason): simulated to recliner Toileting- Clothing Manipulation and Hygiene: Min guard;Sitting/lateral lean       Functional mobility during ADLs: Min guard;Rolling walker;Cueing for safety General ADL Comments: Pt with O2 SATs dropping and HR increasing with minimal exertion. Originally planned for pt to ambulate to bathroom for toilet transfers and ADLs     Vision Baseline Vision/History: Wears glasses Wears Glasses: Reading only Patient Visual Report: No change from baseline       Perception     Praxis      Pertinent Vitals/Pain Pain Assessment: No/denies pain     Hand Dominance Right   Extremity/Trunk Assessment Upper Extremity Assessment Upper Extremity Assessment: Generalized weakness   Lower Extremity Assessment Lower Extremity Assessment: Defer to PT evaluation  Cervical / Trunk Assessment Cervical / Trunk Assessment: Normal   Communication Communication Communication: No difficulties   Cognition Arousal/Alertness: Awake/alert Behavior During Therapy: WFL for tasks assessed/performed Overall Cognitive Status: Within  Functional Limits for tasks assessed                                     General Comments       Exercises     Shoulder Instructions      Home Living Family/patient expects to be discharged to:: Private residence Living Arrangements: Alone Available Help at Discharge: Family;Available PRN/intermittently Type of Home: Mobile home Home Access: Stairs to enter Entrance Stairs-Number of Steps: 4 Entrance Stairs-Rails: Right;Left;Can reach both Home Layout: One level     Bathroom Shower/Tub: Chief Strategy Officer: Standard     Home Equipment: None   Additional Comments: Pt thinks family member has some equipment available      Prior Functioning/Environment Level of Independence: Independent        Comments: Pt lives alone, Ind with ADLs/selcare, cooking, IADLs and drives        OT Problem List: Decreased activity tolerance;Decreased knowledge of use of DME or AE;Impaired balance (sitting and/or standing);Decreased strength      OT Treatment/Interventions: Self-care/ADL training;DME and/or AE instruction;Therapeutic activities;Balance training;Energy conservation;Patient/family education    OT Goals(Current goals can be found in the care plan section) Acute Rehab OT Goals Patient Stated Goal: go home OT Goal Formulation: With patient Time For Goal Achievement: 04/05/21 Potential to Achieve Goals: Good ADL Goals Pt Will Perform Grooming: with min guard assist;with supervision;with set-up;standing Pt Will Perform Upper Body Bathing: with set-up;with modified independence Pt Will Perform Lower Body Bathing: with min guard assist;with supervision;with set-up Pt Will Perform Upper Body Dressing: with set-up;with modified independence Pt Will Perform Lower Body Dressing: with min guard assist;with supervision;with set-up Pt Will Transfer to Toilet: with supervision;with modified independence;ambulating Pt Will Perform Toileting - Clothing  Manipulation and hygiene: with supervision;with modified independence Pt Will Perform Tub/Shower Transfer: with supervision;with modified independence;ambulating;shower seat;grab bars  OT Frequency: Min 2X/week   Barriers to D/C:            Co-evaluation              AM-PAC OT "6 Clicks" Daily Activity     Outcome Measure Help from another person eating meals?: None Help from another person taking care of personal grooming?: A Little Help from another person toileting, which includes using toliet, bedpan, or urinal?: A Little Help from another person bathing (including washing, rinsing, drying)?: A Little Help from another person to put on and taking off regular upper body clothing?: None Help from another person to put on and taking off regular lower body clothing?: A Little 6 Click Score: 20   End of Session Equipment Utilized During Treatment: Gait belt;Oxygen  Activity Tolerance: Patient limited by fatigue Patient left: in chair;with call bell/phone within reach;with chair alarm set  OT Visit Diagnosis: Muscle weakness (generalized) (M62.81)                Time: 2683-4196 OT Time Calculation (min): 29 min Charges:  OT General Charges $OT Visit: 1 Visit OT Evaluation $OT Eval Low Complexity: 1 Low OT Treatments $Self Care/Home Management : 8-22 mins    Galen Manila 03/22/2021, 1:03 PM

## 2021-03-22 NOTE — Progress Notes (Signed)
IV to PO   This patient is receiving the antibiotic(s) doxycycline by the  intravenous route. Based on criteria approved by the Pharmacy and Therapeutics  Committee, and the Infectious Disease Division, the antibiotic(s) is / are being converted  to equivalent oral dose form(s). These criteria include:  ? -Patient being treated for a respiratory tract infection, urinary tract infection,        cellulitis, or Clostridium Difficile Associated Diarrhea  ? -The patient is not neutropenic and does not exhibit a GI malabsorption state  ? -The patient is eating (either orally or per tube) and/or has been taking other        orally administered medications for at least 24 hours.  ? -The patient is improving clinically (physician assessment and a 24-hour Tmax        of 100.5 F).   Jettie Pagan, PharmD, BCIDP Infectious Disease Pharmacist  Phone: [phone]

## 2021-03-22 NOTE — Progress Notes (Signed)
  Echocardiogram 2D Echocardiogram has been performed.  Pieter Partridge 03/22/2021, 1:43 PM

## 2021-03-22 NOTE — Progress Notes (Signed)
Physical Therapy Treatment Patient Details Name: Laurie Pugh MRN: [id number] DOB: 1931/12/14 Today's Date: 03/22/2021    History of Present Illness Pt admitted from home with AMS and dx with severe sepsis 2* PNA.  Pt with hx of a-fib, CAD, and lumbar vertebroplasty in 2020.    PT Comments    Limited session today. Pt stated "the doctor cancelled my walk today because of my breathing." Pt reports she was given lasix and she has been urinating "every 2 minutes." She was agreeable to walk as short distance across room to bsc and back. Pt was unsteady and had a LOB x 1 during session. Will continue to follow and progress activity as able. Pt may need to use a walker for ambulation safety if she remains unsteady.     Follow Up Recommendations  Home health PT;Supervision for mobility/OOB     Equipment Recommendations   (continuing to assess)    Recommendations for Other Services       Precautions / Restrictions Precautions Precautions: Fall Precaution Comments: watch HR and O2 SATs Restrictions Weight Bearing Restrictions: No    Mobility  Bed Mobility Overal bed mobility: Needs Assistance Bed Mobility: Supine to Sit;Sit to Supine     Supine to sit: Supervision;HOB elevated Sit to supine: Supervision;HOB elevated   General bed mobility comments: Increased time    Transfers Overall transfer level: Needs assistance Equipment used: None Transfers: Sit to/from Stand Sit to Stand: Min assist         General transfer comment: Min A to steady.  Ambulation/Gait Ambulation/Gait assistance: Min assist Gait Distance (Feet): 15 Feet (x2) Assistive device: None Gait Pattern/deviations: Step-through pattern;Decreased stride length     General Gait Details: LOB x 1 while walking to [facility] across room-Min A to prevent fall. O2 93% on 4L. May need to consider RW use for ambulation safety   Stairs             Wheelchair Mobility    Modified Rankin (Stroke Patients  Only)       Balance Overall balance assessment: Needs assistance         Standing balance support: During functional activity Standing balance-Leahy Scale: Poor                              Cognition Arousal/Alertness: Awake/alert Behavior During Therapy: WFL for tasks assessed/performed Overall Cognitive Status: Within Functional Limits for tasks assessed                                        Exercises      General Comments        Pertinent Vitals/Pain Pain Assessment: No/denies pain    Home Living                      Prior Function            PT Goals (current goals can now be found in the care plan section) Acute Rehab PT Goals Patient Stated Goal: go home Progress towards PT goals: Progressing toward goals    Frequency    Min 3X/week      PT Plan Current plan remains appropriate    Co-evaluation              AM-PAC PT "6 Clicks" Mobility   Outcome Measure  Help needed  turning from your back to your side while in a flat bed without using bedrails?: None Help needed moving from lying on your back to sitting on the side of a flat bed without using bedrails?: None Help needed moving to and from a bed to a chair (including a wheelchair)?: A Little Help needed standing up from a chair using your arms (e.g., wheelchair or bedside chair)?: A Little Help needed to walk in hospital room?: A Little Help needed climbing 3-5 steps with a railing? : A Little 6 Click Score: 20    End of Session Equipment Utilized During Treatment: Oxygen Activity Tolerance: Patient tolerated treatment well Patient left: in bed;with call bell/phone within reach   PT Visit Diagnosis: Unsteadiness on feet (R26.81);Muscle weakness (generalized) (M62.81);Difficulty in walking, not elsewhere classified (R26.2)     Time: 4097-3532 PT Time Calculation (min) (ACUTE ONLY): 25 min  Charges:  $Gait Training: 8-22 mins $Therapeutic  Activity: 8-22 mins                         Faye Ramsay, PT Acute Rehabilitation  Office: [phone] Pager: [phone]

## 2021-03-23 LAB — BASIC METABOLIC PANEL
Anion gap: 11 (ref 5–15)
BUN: 17 mg/dL (ref 8–23)
CO2: 27 mmol/L (ref 22–32)
Calcium: 8.4 mg/dL — ABNORMAL LOW (ref 8.9–10.3)
Chloride: 101 mmol/L (ref 98–111)
Creatinine, Ser: 0.64 mg/dL (ref 0.44–1.00)
GFR, Estimated: >60 mL/min (ref >60)
Glucose, Bld: 80 mg/dL (ref 70–99)
Potassium: 3.2 mmol/L — ABNORMAL LOW (ref 3.5–5.1)
Sodium: 139 mmol/L (ref 135–145)

## 2021-03-23 LAB — BRAIN NATRIURETIC PEPTIDE: B Natriuretic Peptide: 393.7 pg/mL — ABNORMAL HIGH (ref 0.0–100.0)

## 2021-03-23 LAB — CBC WITH DIFFERENTIAL/PLATELET
Abs Immature Granulocytes: 0.04 10*3/uL (ref 0.00–0.07)
Basophils Absolute: 0.1 10*3/uL (ref 0.0–0.1)
Basophils Relative: 1 %
Eosinophils Absolute: 0.1 10*3/uL (ref 0.0–0.5)
Eosinophils Relative: 1 %
HCT: 43.7 % (ref 36.0–46.0)
Hemoglobin: 13.5 g/dL (ref 12.0–15.0)
Immature Granulocytes: 0 %
Lymphocytes Relative: 10 %
Lymphs Abs: 1 10*3/uL (ref 0.7–4.0)
MCH: 30.2 pg (ref 26.0–34.0)
MCHC: 30.9 g/dL (ref 30.0–36.0)
MCV: 97.8 fL (ref 80.0–100.0)
Monocytes Absolute: 0.8 10*3/uL (ref 0.1–1.0)
Monocytes Relative: 8 %
Neutro Abs: 8.2 10*3/uL — ABNORMAL HIGH (ref 1.7–7.7)
Neutrophils Relative %: 80 %
Platelets: 149 10*3/uL — ABNORMAL LOW (ref 150–400)
RBC: 4.47 MIL/uL (ref 3.87–5.11)
RDW: 15.2 % (ref 11.5–15.5)
WBC: 10.2 10*3/uL (ref 4.0–10.5)
nRBC: 0 % (ref 0.0–0.2)

## 2021-03-23 MED ORDER — POTASSIUM CHLORIDE CRYS ER 20 MEQ PO TBCR
40.0000 meq | EXTENDED_RELEASE_TABLET | Freq: Two times a day (BID) | ORAL | Status: AC
  Administered 2021-03-23 (×2): 40 meq via ORAL
  Filled 2021-03-23 (×2): qty 2

## 2021-03-23 NOTE — Progress Notes (Signed)
Physical Therapy Treatment Patient Details Name: Laurie Pugh MRN: [id number] DOB: Jul 30, 1932 Today's Date: 03/23/2021    History of Present Illness Pt admitted from home with AMS and dx with severe sepsis 2* PNA.  Pt with hx of a-fib, CAD, and lumbar vertebroplasty in 2020.    PT Comments    Pt continues to report feeling weak. She did fairly well on today. Used rollator for ambulation safety. Pt also took a few steps in room without using a RW. O2 93% on 3L during session. She needs to mobilize more. Unfortunately, she primarily lies in bed all day 2* to being on lasix and purewick. Will continue to follow.     Follow Up Recommendations  Home health PT;Supervision - Intermittent     Equipment Recommendations  Rolling walker with 5" wheels    Recommendations for Other Services OT consult     Precautions / Restrictions Precautions Precautions: Fall Precaution Comments: watch HR and O2 SATs Restrictions Weight Bearing Restrictions: No    Mobility  Bed Mobility Overal bed mobility: Needs Assistance Bed Mobility: Supine to Sit;Sit to Supine     Supine to sit: Supervision;HOB elevated Sit to supine: Supervision;HOB elevated   General bed mobility comments: Increased time. Task is effortful for pt.    Transfers Overall transfer level: Needs assistance Equipment used: None;4-wheeled walker Transfers: Sit to/from [employer] Transfers Sit to Stand: Min guard Stand pivot transfers: Min assist       General transfer comment: Min A to steady without UE support. Min guard with use of walker. Increased time. Cues for safety, proper operation of rollator.  Ambulation/Gait Ambulation/Gait assistance: Min guard;Min assist Gait Distance (Feet): 100 Feet Assistive device: 4-wheeled walker Gait Pattern/deviations: Step-through pattern;Decreased stride length     General Gait Details: Progressed from Min A to Min guard as distance increased. Unsteady. O2 93% on  3L. Pt denied dyspnea.   Stairs             Wheelchair Mobility    Modified Rankin (Stroke Patients Only)       Balance Overall balance assessment: Needs assistance         Standing balance support: Bilateral upper extremity supported Standing balance-Leahy Scale: Poor                              Cognition Arousal/Alertness: Awake/alert Behavior During Therapy: WFL for tasks assessed/performed Overall Cognitive Status: Within Functional Limits for tasks assessed                                        Exercises      General Comments        Pertinent Vitals/Pain Pain Assessment: No/denies pain    Home Living                      Prior Function            PT Goals (current goals can now be found in the care plan section) Progress towards PT goals: Progressing toward goals    Frequency    Min 3X/week      PT Plan Current plan remains appropriate    Co-evaluation              AM-PAC PT "6 Clicks" Mobility   Outcome Measure  Help needed turning from your  back to your side while in a flat bed without using bedrails?: None Help needed moving from lying on your back to sitting on the side of a flat bed without using bedrails?: None Help needed moving to and from a bed to a chair (including a wheelchair)?: A Little Help needed standing up from a chair using your arms (e.g., wheelchair or bedside chair)?: A Little Help needed to walk in hospital room?: A Little Help needed climbing 3-5 steps with a railing? : A Lot 6 Click Score: 19    End of Session Equipment Utilized During Treatment: Oxygen Activity Tolerance: Patient tolerated treatment well Patient left: in bed;with call bell/phone within reach;with bed alarm set   PT Visit Diagnosis: Unsteadiness on feet (R26.81);Muscle weakness (generalized) (M62.81);Difficulty in walking, not elsewhere classified (R26.2)     Time: 1165-7903 PT Time  Calculation (min) (ACUTE ONLY): 23 min  Charges:  $Gait Training: 23-37 mins                         Faye Ramsay, PT Acute Rehabilitation  Office: [phone] Pager: [phone]

## 2021-03-23 NOTE — Progress Notes (Signed)
PROGRESS NOTE    Laurie Pugh  YBF:[id number] DOB: 12-08-1931 DOA: 03/20/2021 PCP: Crist Infante, MD   Brief Narrative:  Laurie Pugh is a 87 y.o. female with medical history significant of paroxysmal atrial fibrillation on Eliquis, coronary artery disease, GERD, esophageal stricture, essential hypertension, hyperlipidemia, PSVT, was brought in by her daughter due to observed confusion.  She complained of epigastric pain in the ER and shortness of breath.  Denied any fever or sick contact.  Patient was evaluated and found to have evidence of pneumonia.  She also met severe sepsis criteria based on tachycardia, hypoxia and leukocytosis. she is not on oxygen.Marland Kitchen She was COVID-19 negative.  She was admitted to the hospital with community-acquired pneumonia and metabolic encephalopathy.  She was started on Rocephin and doxycycline.  Did not improve much and required oxygen.  BNP was elevated.  Chest x-ray also suggested possible acute pulmonary edema.  She was started on Lasix 40 mg IV twice daily and had decent diuresis and she started feeling better.  Transthoracic echo showed normal ejection fraction and no wall motion would not be.  BNP was elevated so she had combination of pneumonia and acute on chronic congestive heart failure with preserved ejection fraction.  She was evaluated by PT OT who recommended home health.   Assessment & Plan:   Principal Problem:   Pneumonia Active Problems:   Atrial fibrillation ([place])   Essential hypertension   Esophageal stricture   Chronic anticoagulation  Severe sepsis and acute hypoxic respiratory failure secondary to community-acquired pneumonia/possible aspiration pneumonia: Patient met severe sepsis criteria based on tachypnea, hypoxia and leukocytosis.  Patient feels better today but is still on 2 to 3 L oxygen.  She has had very good diuresis.  We will try to wean her oxygen and as we are able to.  Has very faint crackles at the bases today.   Overall lung exam much better than yesterday.  We will continue Lasix twice daily, current antibiotics.  Leukocytosis resolved.  Acute metabolic encephalopathy: Secondary to pneumonia and hypoxia.  Resolved.  She is fully alert and oriented.     essential hypertension: Fairly controlled.  Continue home medications which include atenolol, chlorthalidone and Lotensin.   atrial fibrillation: Rate is controlled.  Continue Eliquis.   esophageal stricture: Continue PPIs   GERD: Continue PPI.   hyperlipidemia: Resume Crestor.  Hypokalemia: 3.2.  Will replace.   chronic pain syndrome: Has been on Percocet at home.  Continue.  DVT prophylaxis: apixaban (ELIQUIS) tablet 2.5 mg Start: 03/21/21 0045   Code Status: Full Code  Family Communication: None present at bedside.  Plan of care discussed with the patient and then with her daughter Santiago Glad over the phone.  Status is: Inpatient  Remains inpatient appropriate because:Inpatient level of care appropriate due to severity of illness  Dispo: The patient is from: Home              Anticipated d/c is to: Home              Patient currently is not medically stable to d/c.   Difficult to place patient No        Estimated body mass index is 21.26 kg/m as calculated from the following:   Height as of this encounter: _0  (1.6 m).   Weight as of this encounter: 54.4 kg.      Nutritional status:               Consultants:  None  Procedures:  None  Antimicrobials:  Anti-infectives (From admission, onward)    Start     Dose/Rate Route Frequency Ordered Stop   03/22/21 1800  doxycycline (VIBRA-TABS) tablet 100 mg        100 mg Oral Every 12 hours 03/22/21 1447     03/21/21 1800  doxycycline (VIBRAMYCIN) 100 mg in sodium chloride 0.9 % 250 mL IVPB  Status:  Discontinued        100 mg 125 mL/hr over 120 Minutes Intravenous Every 12 hours 03/20/21 2224 03/22/21 1447   03/21/21 1700  cefTRIAXone (ROCEPHIN) 2 g in sodium  chloride 0.9 % 100 mL IVPB        2 g 200 mL/hr over 30 Minutes Intravenous Every 24 hours 03/20/21 2224 03/26/21 1659   03/20/21 1715  cefTRIAXone (ROCEPHIN) 1 g in sodium chloride 0.9 % 100 mL IVPB        1 g 200 mL/hr over 30 Minutes Intravenous  Once 03/20/21 1701 03/20/21 1919   03/20/21 1715  azithromycin (ZITHROMAX) 500 mg in sodium chloride 0.9 % 250 mL IVPB        500 mg 250 mL/hr over 60 Minutes Intravenous  Once 03/20/21 1701 03/20/21 1919          Subjective: Seen and examined.  Fully alert and oriented.  States that her breathing is better than yesterday.  No new complaint.  Objective: Vitals:   03/22/21 1244 03/22/21 1510 03/22/21 2055 03/23/21 0437  BP: (!) 170/94 135/79 119/82 (!) 146/77  Pulse: 84 83 77 95  Resp: _0 Temp: (!) 97.4 F (36.3 C)  (!) 97.5 F (36.4 C) 97.9 F (36.6 C)  TempSrc: Oral  Oral Oral  SpO2: 94%  98% 96%  Weight:      Height:        Intake/Output Summary (Last 24 hours) at 03/23/2021 0946 Last data filed at 03/22/2021 2312 Gross per 24 hour  Intake 300 ml  Output 3900 ml  Net -3600 ml    Filed Weights   03/20/21 1446  Weight: 54.4 kg    Examination:  General exam: Appears calm and comfortable  Respiratory system: Faint bibasilar crackles. Respiratory effort normal. Cardiovascular system: S1 & S2 heard, RRR. No JVD, murmurs, rubs, gallops or clicks. No pedal edema. Gastrointestinal system: Abdomen is nondistended, soft and nontender. No organomegaly or masses felt. Normal bowel sounds heard. Central nervous system: Alert and oriented. No focal neurological deficits. Extremities: Symmetric 5 x 5 power. Skin: No rashes, lesions or ulcers.  Psychiatry: Judgement and insight appear normal. Mood & affect appropriate.   Data Reviewed: I have personally reviewed following labs and imaging studies  CBC: Recent Labs  Lab 03/20/21 1538 03/21/21 0024 03/23/21 0507  WBC 15.3* 15.6* 10.2  NEUTROABS 13.5* 13.3* 8.2*   HGB 13.6 14.0 13.5  HCT 42.6 44.7 43.7  MCV 95.1 97.2 97.8  PLT 149* 135* 149*    Basic Metabolic Panel: Recent Labs  Lab 03/20/21 1538 03/21/21 0024 03/23/21 0507  NA 142 140 139  K 4.0 4.5 3.2*  CL 107 108 101  CO2 _1 GLUCOSE 147* 100* 80  BUN _2 CREATININE 0.68 0.48 0.64  CALCIUM 8.8* 8.5* 8.4*    GFR: Estimated Creatinine Clearance: 41.8 mL/min (by C-G formula based on SCr of 0.64 mg/dL). Liver Function Tests: Recent Labs  Lab 03/20/21 1538 03/21/21 0024  AST 308* 195*  ALT 103* 88*  ALKPHOS 84 86  BILITOT 3.5* 4.6*  PROT 6.3* 6.0*  ALBUMIN 3.6 3.3*    Recent Labs  Lab 03/20/21 1627  LIPASE 18    Recent Labs  Lab 03/20/21 1627  AMMONIA 23    Coagulation Profile: Recent Labs  Lab 03/20/21 1534  INR 1.4*    Cardiac Enzymes: No results for input(s): CKTOTAL, CKMB, CKMBINDEX, TROPONINI in the last 168 hours. BNP (last 3 results) No results for input(s): PROBNP in the last 8760 hours. HbA1C: No results for input(s): HGBA1C in the last 72 hours. CBG: Recent Labs  Lab 03/20/21 1600  GLUCAP 144*    Lipid Profile: No results for input(s): CHOL, HDL, LDLCALC, TRIG, CHOLHDL, LDLDIRECT in the last 72 hours. Thyroid Function Tests: No results for input(s): TSH, T4TOTAL, FREET4, T3FREE, THYROIDAB in the last 72 hours. Anemia Panel: No results for input(s): VITAMINB12, FOLATE, FERRITIN, TIBC, IRON, RETICCTPCT in the last 72 hours. Sepsis Labs: Recent Labs  Lab 03/20/21 1538 03/22/21 1106  PROCALCITON  --  1.06  LATICACIDVEN 1.5  --      Recent Results (from the past 240 hour(s))  Urine culture     Status: Abnormal   Collection Time: 03/20/21  3:38 PM   Specimen: Urine, Random  Result Value Ref Range Status   Specimen Description   Final    URINE, RANDOM Performed at [facility] [address], [place], [place] [address]    Special Requests   Final    NONE Performed at [facility], [place] [address] [place], [place] [address]    Culture MULTIPLE SPECIES PRESENT, SUGGEST RECOLLECTION (A)  Final   Report Status 03/22/2021 FINAL  Final  Resp Panel by RT-PCR (Flu A&B, Covid) Nasopharyngeal Swab     Status: None   Collection Time: 03/20/21  3:38 PM   Specimen: Nasopharyngeal Swab; Nasopharyngeal(NP) swabs in vial transport medium  Result Value Ref Range Status   SARS Coronavirus 2 by RT PCR NEGATIVE NEGATIVE Final    Comment: (NOTE) SARS-CoV-2 target nucleic acids are NOT DETECTED.  The SARS-CoV-2 RNA is generally detectable in upper respiratory specimens during the acute phase of infection. The lowest concentration of SARS-CoV-2 viral copies this assay can detect is 138 copies/mL. A negative result does not preclude SARS-Cov-2 infection and should not be used as the sole basis for treatment or other patient management decisions. A negative result may occur with  improper specimen collection/handling, submission of specimen other than nasopharyngeal swab, presence of viral mutation(s) within the areas targeted by this assay, and inadequate number of viral copies(<138 copies/mL). A negative result must be combined with clinical observations, patient history, and epidemiological information. The expected result is Negative.  Fact Sheet for Patients:  EntrepreneurPulse.com.au  Fact Sheet for Healthcare Providers:  IncredibleEmployment.be  This test is no t yet approved or cleared by the [place] FDA and  has been authorized for detection and/or diagnosis of SARS-CoV-2 by FDA under an Emergency Use Authorization (EUA). This EUA will remain  in effect (meaning this test can be used) for the duration of the COVID-19 declaration under Section 564(b)(1) of the Act, 21 U.S.C.section 360bbb-3(b)(1), unless the authorization is terminated  or revoked sooner.       Influenza A by PCR NEGATIVE NEGATIVE Final   Influenza B  by PCR NEGATIVE NEGATIVE Final    Comment: (NOTE) The Xpert Xpress SARS-CoV-2/FLU/RSV plus assay is intended as an aid in the diagnosis of influenza from Nasopharyngeal swab specimens and should not be  used as a sole basis for treatment. Nasal washings and aspirates are unacceptable for Xpert Xpress SARS-CoV-2/FLU/RSV testing.  Fact Sheet for Patients: EntrepreneurPulse.com.au  Fact Sheet for Healthcare Providers: IncredibleEmployment.be  This test is not yet approved or cleared by the [place] FDA and has been authorized for detection and/or diagnosis of SARS-CoV-2 by FDA under an Emergency Use Authorization (EUA). This EUA will remain in effect (meaning this test can be used) for the duration of the COVID-19 declaration under Section 564(b)(1) of the Act, 21 U.S.C. section 360bbb-3(b)(1), unless the authorization is terminated or revoked.  Performed at [facility], [place] [address], [place], [place] [address]   Culture, blood (routine x 2) Call MD if unable to obtain prior to antibiotics being given     Status: None (Preliminary result)   Collection Time: 03/21/21 12:24 AM   Specimen: BLOOD  Result Value Ref Range Status   Specimen Description   Final    BLOOD RIGHT ANTECUBITAL Performed at [facility] [address] [place], [place] [address]    Special Requests   Final    BOTTLES DRAWN AEROBIC ONLY Blood Culture adequate volume Performed at [facility] [address], [place], [place] [address]    Culture   Final    NO GROWTH 2 DAYS Performed at [facility] [address] [place], [place] [address]    Report Status PENDING  Incomplete  Culture, blood (routine x 2) Call MD if unable to obtain prior to antibiotics being given     Status: Abnormal   Collection Time: 03/21/21 12:24 AM   Specimen: BLOOD  Result Value Ref Range Status   Specimen Description   Final     BLOOD LEFT HAND Performed at [facility] [address] [place], [place] [address]    Special Requests   Final    BOTTLES DRAWN AEROBIC ONLY Blood Culture adequate volume Performed at [facility] [address], [place], [place] [address]    Culture  Setup Time (A)  Final    GRAM VARIABLE ROD AEROBIC BOTTLE ONLY CRITICAL RESULT CALLED TO, READ BACK BY AND VERIFIED WITH: E,WILLIAMSON PHARMD _0  03/21/21 EB    Culture (A)  Final    BACILLUS SPECIES Standardized susceptibility testing for this organism is not available. Performed at [facility], [place] [address], [place],  [address]    Report Status 03/22/2021 FINAL  Final  Blood Culture ID Panel (Reflexed)     Status: None   Collection Time: 03/21/21 12:24 AM  Result Value Ref Range Status   Enterococcus faecalis NOT DETECTED NOT DETECTED Final   Enterococcus Faecium NOT DETECTED NOT DETECTED Final   Listeria monocytogenes NOT DETECTED NOT DETECTED Final   Staphylococcus species NOT DETECTED NOT DETECTED Final   Staphylococcus aureus (BCID) NOT DETECTED NOT DETECTED Final   Staphylococcus epidermidis NOT DETECTED NOT DETECTED Final   Staphylococcus lugdunensis NOT DETECTED NOT DETECTED Final   Streptococcus species NOT DETECTED NOT DETECTED Final   Streptococcus agalactiae NOT DETECTED NOT DETECTED Final   Streptococcus pneumoniae NOT DETECTED NOT DETECTED Final   Streptococcus pyogenes NOT DETECTED NOT DETECTED Final   A.calcoaceticus-baumannii NOT DETECTED NOT DETECTED Final   Bacteroides fragilis NOT DETECTED NOT DETECTED Final   Enterobacterales NOT DETECTED NOT DETECTED Final   Enterobacter cloacae complex NOT DETECTED NOT DETECTED Final   Escherichia coli NOT DETECTED NOT DETECTED Final   Klebsiella aerogenes NOT DETECTED  NOT DETECTED Final   Klebsiella oxytoca NOT DETECTED NOT DETECTED Final   Klebsiella pneumoniae NOT DETECTED NOT DETECTED Final   Proteus  species NOT DETECTED NOT DETECTED Final   Salmonella species NOT DETECTED NOT DETECTED Final   Serratia marcescens NOT DETECTED NOT DETECTED Final   Haemophilus influenzae NOT DETECTED NOT DETECTED Final   Neisseria meningitidis NOT DETECTED NOT DETECTED Final   Pseudomonas aeruginosa NOT DETECTED NOT DETECTED Final   Stenotrophomonas maltophilia NOT DETECTED NOT DETECTED Final   Candida albicans NOT DETECTED NOT DETECTED Final   Candida auris NOT DETECTED NOT DETECTED Final   Candida glabrata NOT DETECTED NOT DETECTED Final   Candida krusei NOT DETECTED NOT DETECTED Final   Candida parapsilosis NOT DETECTED NOT DETECTED Final   Candida tropicalis NOT DETECTED NOT DETECTED Final   Cryptococcus neoformans/gattii NOT DETECTED NOT DETECTED Final    Comment: Performed at [facility], [place] [address] [place], [place] [address]       Radiology Studies: ECHOCARDIOGRAM COMPLETE  Result Date: 03/22/2021    ECHOCARDIOGRAM REPORT   Patient Name:   DAYTONA HEDMAN Date of Exam: 03/22/2021 Medical Rec #:  [id number]         Height:       63.0 in Accession #:    [id number]        Weight:       120.0 lb Date of Birth:  12-13-1931        BSA:          1.556 m Patient Age:    37 years          BP:           170/94 mmHg Patient Gender: F                 HR:           84 bpm. Exam Location:  Inpatient Procedure: 2D Echo, Cardiac Doppler and Color Doppler Indications:    CHF  History:        Patient has prior history of Echocardiogram examinations, most                 recent 08/27/2008. CHF, CAD, Arrythmias:Atrial Fibrillation,                 Signs/Symptoms:Shortness of Breath and Pneumonia, sepsis; Risk                 Factors:Hypertension and Dyslipidemia.  Sonographer:    Dustin Flock Referring Phys: [id number] Mabry Tift  Sonographer Comments: Image acquisition challenging due to respiratory motion. IMPRESSIONS  1. Left ventricular ejection fraction, by estimation, is 60 to 65%. The left ventricle  has normal function. The left ventricle has no regional wall motion abnormalities. There is mild concentric left ventricular hypertrophy. Left ventricular diastolic parameters are indeterminate. Elevated left atrial pressure.  2. Right ventricular systolic function was not well visualized, but is likely low normal. The right ventricular size is normal. There is moderately elevated pulmonary artery systolic pressure.  3. Left atrial size was severely dilated.  4. Right atrial size was mildly dilated.  5. The mitral valve is normal in structure. No evidence of mitral valve regurgitation.  6. Tricuspid valve regurgitation is mild to moderate; valve morphology is not well visualized but a mechanism of valve prolapse is suggestive by Spectral Doppler signal.  7. The aortic valve is tricuspid. Aortic valve regurgitation is mild. Aortic regurgitation PHT measures 694 msec. Comparison(s): A prior  study was performed on 06/28/2004. Prior images reviewed side by side. Similar study, aortic regurgitation is new from prior. FINDINGS  Left Ventricle: Left ventricular ejection fraction, by estimation, is 60 to 65%. The left ventricle has normal function. The left ventricle has no regional wall motion abnormalities. The left ventricular internal cavity size was small. There is mild concentric left ventricular hypertrophy. Left ventricular diastolic parameters are indeterminate. Elevated left atrial pressure. Right Ventricle: The right ventricular size is normal. No increase in right ventricular wall thickness. Right ventricular systolic function was not well visualized. There is moderately elevated pulmonary artery systolic pressure. The tricuspid regurgitant velocity is 3.71 m/s, and with an assumed right atrial pressure of 3 mmHg, the estimated right ventricular systolic pressure is 19.1 mmHg. Left Atrium: Left atrial size was severely dilated. Right Atrium: Right atrial size was mildly dilated. Pericardium: There is no evidence  of pericardial effusion. Mitral Valve: The mitral valve is normal in structure. No evidence of mitral valve regurgitation. MV peak gradient, 9.1 mmHg. The mean mitral valve gradient is 3.0 mmHg. Tricuspid Valve: The tricuspid valve is not well visualized. Tricuspid valve regurgitation is mild to moderate. Aortic Valve: The aortic valve is tricuspid. Aortic valve regurgitation is mild. Aortic regurgitation PHT measures 694 msec. Pulmonic Valve: The pulmonic valve was grossly normal. Pulmonic valve regurgitation is trivial. No evidence of pulmonic stenosis. Aorta: The aortic root is normal in size and structure. IAS/Shunts: The atrial septum is grossly normal.  LEFT VENTRICLE PLAX 2D LVIDd:         2.70 cm  Diastology LVIDs:         1.70 cm  LV e' medial:    5.11 cm/s LV PW:         1.10 cm  LV E/e' medial:  21.9 LV IVS:        1.10 cm  LV e' lateral:   4.24 cm/s LVOT diam:     2.10 cm  LV E/e' lateral: 26.4 LV SV:         44 LV SV Index:   28 LVOT Area:     3.46 cm  RIGHT VENTRICLE RV Basal diam:  3.10 cm RV S prime:     8.81 cm/s TAPSE (M-mode): 0.8 cm LEFT ATRIUM             Index       RIGHT ATRIUM           Index LA diam:        2.70 cm 1.73 cm/m  RA Area:     19.70 cm LA Vol (A2C):   64.5 ml 41.44 ml/m RA Volume:   54.00 ml  34.70 ml/m LA Vol (A4C):   85.1 ml 54.68 ml/m LA Biplane Vol: 74.0 ml 47.55 ml/m  AORTIC VALVE LVOT Vmax:   71.70 cm/s LVOT Vmean:  44.400 cm/s LVOT VTI:    0.126 m AI PHT:      694 msec  AORTA Ao Root diam: 3.30 cm MITRAL VALVE                TRICUSPID VALVE MV Area (PHT): 12.24 cm    TR Peak grad:   55.1 mmHg MV Area VTI:   2.00 cm     TR Vmax:        371.00 cm/s MV Peak grad:  9.1 mmHg MV Mean grad:  3.0 mmHg     SHUNTS MV Vmax:       1.51 m/s  Systemic VTI:  0.13 m MV Vmean:      80.1 cm/s    Systemic Diam: 2.10 cm MV Decel Time: 62 msec MV E velocity: 112.00 cm/s MV A velocity: 88.30 cm/s MV E/A ratio:  1.27 Rudean Haskell MD Electronically signed by Rudean Haskell MD Signature Date/Time: 03/22/2021/3:30:00 PM    Final     Scheduled Meds:  apixaban  2.5 mg Oral BID   atenolol  25 mg Oral Daily   benazepril  20 mg Oral Daily   doxycycline  100 mg Oral Q12H   furosemide  40 mg Intravenous BID   gabapentin  100 mg Oral TID   melatonin  3 mg Oral QHS   pantoprazole  40 mg Oral Daily   potassium chloride  40 mEq Oral BID   rosuvastatin  10 mg Oral Daily   Continuous Infusions:  sodium chloride 75 mL/hr at 03/22/21 0607   cefTRIAXone (ROCEPHIN)  IV 2 g (03/22/21 1630)     LOS: 3 days   Time spent: 29 minutes   Darliss Cheney, MD [facility]  03/23/2021, 9:46 AM   How to contact the [facility] Attending or Consulting provider [place] or covering provider during after hours [place], for this patient?  Check the care team in [facility] and look for a) attending/consulting [facility] provider listed and b) the [facility] team listed. Page or secure chat [phone]. Log into [email or web address] and use [place]'s universal password to access. If you do not have the password, please contact the hospital operator. Locate the [facility] provider you are looking for under [facility] and page to a number that you can be directly reached. If you still have difficulty reaching the provider, please page the [facility] (Director on Call) for the Hospitalists listed on amion for assistance.

## 2021-03-24 ENCOUNTER — Encounter (HOSPITAL_COMMUNITY): Payer: Self-pay | Admitting: Internal Medicine

## 2021-03-24 ENCOUNTER — Inpatient Hospital Stay (HOSPITAL_COMMUNITY): Payer: Medicare Other

## 2021-03-24 LAB — CBC WITH DIFFERENTIAL/PLATELET
Abs Immature Granulocytes: 0.03 10*3/uL (ref 0.00–0.07)
Basophils Absolute: 0.1 10*3/uL (ref 0.0–0.1)
Basophils Relative: 1 %
Eosinophils Absolute: 0.1 10*3/uL (ref 0.0–0.5)
Eosinophils Relative: 2 %
HCT: 46.2 % — ABNORMAL HIGH (ref 36.0–46.0)
Hemoglobin: 14.7 g/dL (ref 12.0–15.0)
Immature Granulocytes: 0 %
Lymphocytes Relative: 13 %
Lymphs Abs: 1.2 10*3/uL (ref 0.7–4.0)
MCH: 30.5 pg (ref 26.0–34.0)
MCHC: 31.8 g/dL (ref 30.0–36.0)
MCV: 95.9 fL (ref 80.0–100.0)
Monocytes Absolute: 0.7 10*3/uL (ref 0.1–1.0)
Monocytes Relative: 8 %
Neutro Abs: 6.6 10*3/uL (ref 1.7–7.7)
Neutrophils Relative %: 76 %
Platelets: 185 10*3/uL (ref 150–400)
RBC: 4.82 MIL/uL (ref 3.87–5.11)
RDW: 15 % (ref 11.5–15.5)
WBC: 8.7 10*3/uL (ref 4.0–10.5)
nRBC: 0 % (ref 0.0–0.2)

## 2021-03-24 LAB — BASIC METABOLIC PANEL
Anion gap: 12 (ref 5–15)
BUN: 15 mg/dL (ref 8–23)
CO2: 26 mmol/L (ref 22–32)
Calcium: 9.2 mg/dL (ref 8.9–10.3)
Chloride: 101 mmol/L (ref 98–111)
Creatinine, Ser: 0.58 mg/dL (ref 0.44–1.00)
GFR, Estimated: >60 mL/min (ref >60)
Glucose, Bld: 81 mg/dL (ref 70–99)
Potassium: 4.1 mmol/L (ref 3.5–5.1)
Sodium: 139 mmol/L (ref 135–145)

## 2021-03-24 NOTE — Plan of Care (Signed)
Pt AAOx4 with neuro check; pt was able to sleep through the night; now resting comfortably in bed watching TV; no s/s of acute distress or pain reported or observed; call light within reach and bed at lowest position for safety.

## 2021-03-24 NOTE — Progress Notes (Signed)
PROGRESS NOTE    Laurie Pugh  JJO:[id number] DOB: [birth year] DOA: 03/20/2021 PCP: Crist Infante, MD   Brief Narrative:  Laurie Pugh is a 87 y.o. female with medical history significant of paroxysmal atrial fibrillation on Eliquis, coronary artery disease, GERD, esophageal stricture, essential hypertension, hyperlipidemia, PSVT, was brought in by her daughter due to observed confusion.  She complained of epigastric pain in the ER and shortness of breath.  Denied any fever or sick contact.  Patient was evaluated and found to have evidence of pneumonia.  She also met severe sepsis criteria based on tachycardia, hypoxia and leukocytosis. she is not on oxygen.Marland Kitchen She was COVID-19 negative.  She was admitted to the hospital with community-acquired pneumonia and metabolic encephalopathy.  She was started on Rocephin and doxycycline.  Did not improve much and required oxygen.  BNP was elevated.  Chest x-ray also suggested possible acute pulmonary edema.  She was started on Lasix 40 mg IV twice daily and had decent diuresis and she started feeling better.  Transthoracic echo showed normal ejection fraction and no wall motion would not be.  BNP was elevated so she had combination of pneumonia and acute on chronic congestive heart failure with preserved ejection fraction.  She was evaluated by PT OT who recommended home health.   Assessment & Plan:   Principal Problem:   Pneumonia Active Problems:   Atrial fibrillation ([place])   Essential hypertension   Esophageal stricture   Chronic anticoagulation  Severe sepsis and acute hypoxic respiratory failure secondary to community-acquired pneumonia/possible aspiration pneumonia: Patient met severe sepsis criteria based on tachypnea, hypoxia and leukocytosis.  Patient feels better today but is still on 2 to 3 L oxygen. We will try to wean her oxygen and as we are able to, patient's primary nurse has been advised to do so twice this morning.  Still has  very faint crackles at the bases today.  Overall lung exam is no different than yesterday.  We will continue Lasix twice daily, current antibiotics and bronchodilators.  Acute metabolic encephalopathy: Secondary to pneumonia and hypoxia.  Resolved.  She is fully alert and oriented.     essential hypertension: Fairly controlle for most part, slightly elevated this morning.  Continue home medications which include atenolol, chlorthalidone and Lotensin.   atrial fibrillation: Rate is controlled.  Continue Eliquis.   esophageal stricture: Continue PPIs   GERD: Continue PPI.   hyperlipidemia: Continue Crestor.  Hypokalemia: Resolved   chronic pain syndrome: Has been on Percocet at home.  Continue.  DVT prophylaxis: apixaban (ELIQUIS) tablet 2.5 mg Start: 03/21/21 0045   Code Status: Full Code  Family Communication: None present at bedside.  Plan of care discussed with the patient and then with her daughter Santiago Glad over the phone.  Status is: Inpatient  Remains inpatient appropriate because:Inpatient level of care appropriate due to severity of illness  Dispo: The patient is from: Home              Anticipated d/c is to: Home              Patient currently is not medically stable to d/c.   Difficult to place patient No        Estimated body mass index is 21.26 kg/m as calculated from the following:   Height as of this encounter: 5' 3"  (1.6 m).   Weight as of this encounter: 54.4 kg.      Nutritional status:  Consultants:  None  Procedures:  None  Antimicrobials:  Anti-infectives (From admission, onward)    Start     Dose/Rate Route Frequency Ordered Stop   03/22/21 1800  doxycycline (VIBRA-TABS) tablet 100 mg        100 mg Oral Every 12 hours 03/22/21 1447     03/21/21 1800  doxycycline (VIBRAMYCIN) 100 mg in sodium chloride 0.9 % 250 mL IVPB  Status:  Discontinued        100 mg 125 mL/hr over 120 Minutes Intravenous Every 12 hours 03/20/21  2224 03/22/21 1447   03/21/21 1700  cefTRIAXone (ROCEPHIN) 2 g in sodium chloride 0.9 % 100 mL IVPB        2 g 200 mL/hr over 30 Minutes Intravenous Every 24 hours 03/20/21 2224 03/26/21 1659   03/20/21 1715  cefTRIAXone (ROCEPHIN) 1 g in sodium chloride 0.9 % 100 mL IVPB        1 g 200 mL/hr over 30 Minutes Intravenous  Once 03/20/21 1701 03/20/21 1919   03/20/21 1715  azithromycin (ZITHROMAX) 500 mg in sodium chloride 0.9 % 250 mL IVPB        500 mg 250 mL/hr over 60 Minutes Intravenous  Once 03/20/21 1701 03/20/21 1919          Subjective: Seen and examined.  She states that her breathing is slightly better than yesterday.  She is alert and oriented.  No other complaint.  Objective: Vitals:   03/23/21 0437 03/23/21 1400 03/23/21 2100 03/24/21 0503  BP: (!) 146/77 (!) 164/85 (!) 156/76 (!) 184/90  Pulse: 95 85 78 89  Resp: 16 20 16 16   Temp: 97.9 F (36.6 C) (!) 97.5 F (36.4 C) 97.6 F (36.4 C) 97.6 F (36.4 C)  TempSrc: Oral Oral Oral Oral  SpO2: 96% 98% 99% 97%  Weight:      Height:        Intake/Output Summary (Last 24 hours) at 03/24/2021 1402 Last data filed at 03/24/2021 1228 Gross per 24 hour  Intake 1360 ml  Output 4551 ml  Net -3191 ml    Filed Weights   03/20/21 1446  Weight: 54.4 kg    Examination:  General exam: Appears calm and comfortable  Respiratory system: Crackles at the bases bilaterally and rhonchi in the right upper lobe, respiratory effort normal. Cardiovascular system: S1 & S2 heard, RRR. No JVD, murmurs, rubs, gallops or clicks. No pedal edema. Gastrointestinal system: Abdomen is nondistended, soft and nontender. No organomegaly or masses felt. Normal bowel sounds heard. Central nervous system: Alert and oriented. No focal neurological deficits. Extremities: Symmetric 5 x 5 power. Skin: No rashes, lesions or ulcers.  Psychiatry: Judgement and insight appear normal. Mood & affect appropriate.   Data Reviewed: I have personally  reviewed following labs and imaging studies  CBC: Recent Labs  Lab 03/20/21 1538 03/21/21 0024 03/23/21 0507 03/24/21 0538  WBC 15.3* 15.6* 10.2 8.7  NEUTROABS 13.5* 13.3* 8.2* 6.6  HGB 13.6 14.0 13.5 14.7  HCT 42.6 44.7 43.7 46.2*  MCV 95.1 97.2 97.8 95.9  PLT 149* 135* 149* 865    Basic Metabolic Panel: Recent Labs  Lab 03/20/21 1538 03/21/21 0024 03/23/21 0507 03/24/21 0538  NA 142 140 139 139  K 4.0 4.5 3.2* 4.1  CL 107 108 101 101  CO2 27 23 27 26   GLUCOSE 147* 100* 80 81  BUN 20 18 17 15   CREATININE 0.68 0.48 0.64 0.58  CALCIUM 8.8* 8.5* 8.4* 9.2  GFR: Estimated Creatinine Clearance: 41.8 mL/min (by C-G formula based on SCr of 0.58 mg/dL). Liver Function Tests: Recent Labs  Lab 03/20/21 1538 03/21/21 0024  AST 308* 195*  ALT 103* 88*  ALKPHOS 84 86  BILITOT 3.5* 4.6*  PROT 6.3* 6.0*  ALBUMIN 3.6 3.3*    Recent Labs  Lab 03/20/21 1627  LIPASE 18    Recent Labs  Lab 03/20/21 1627  AMMONIA 23    Coagulation Profile: Recent Labs  Lab 03/20/21 1534  INR 1.4*    Cardiac Enzymes: No results for input(s): CKTOTAL, CKMB, CKMBINDEX, TROPONINI in the last 168 hours. BNP (last 3 results) No results for input(s): PROBNP in the last 8760 hours. HbA1C: No results for input(s): HGBA1C in the last 72 hours. CBG: Recent Labs  Lab 03/20/21 1600  GLUCAP 144*    Lipid Profile: No results for input(s): CHOL, HDL, LDLCALC, TRIG, CHOLHDL, LDLDIRECT in the last 72 hours. Thyroid Function Tests: No results for input(s): TSH, T4TOTAL, FREET4, T3FREE, THYROIDAB in the last 72 hours. Anemia Panel: No results for input(s): VITAMINB12, FOLATE, FERRITIN, TIBC, IRON, RETICCTPCT in the last 72 hours. Sepsis Labs: Recent Labs  Lab 03/20/21 1538 03/22/21 1106  PROCALCITON  --  1.06  LATICACIDVEN 1.5  --      Recent Results (from the past 240 hour(s))  Urine culture     Status: Abnormal   Collection Time: 03/20/21  3:38 PM   Specimen: Urine,  Random  Result Value Ref Range Status   Specimen Description   Final    URINE, RANDOM Performed at [facility] [address] [place], [place] [address]    Special Requests   Final    NONE Performed at [facility], [place] [address] [place], [place] [address]    Culture MULTIPLE SPECIES PRESENT, SUGGEST RECOLLECTION (A)  Final   Report Status 03/22/2021 FINAL  Final  Resp Panel by RT-PCR (Flu A&B, Covid) Nasopharyngeal Swab     Status: None   Collection Time: 03/20/21  3:38 PM   Specimen: Nasopharyngeal Swab; Nasopharyngeal(NP) swabs in vial transport medium  Result Value Ref Range Status   SARS Coronavirus 2 by RT PCR NEGATIVE NEGATIVE Final    Comment: (NOTE) SARS-CoV-2 target nucleic acids are NOT DETECTED.  The SARS-CoV-2 RNA is generally detectable in upper respiratory specimens during the acute phase of infection. The lowest concentration of SARS-CoV-2 viral copies this assay can detect is 138 copies/mL. A negative result does not preclude SARS-Cov-2 infection and should not be used as the sole basis for treatment or other patient management decisions. A negative result may occur with  improper specimen collection/handling, submission of specimen other than nasopharyngeal swab, presence of viral mutation(s) within the areas targeted by this assay, and inadequate number of viral copies(<138 copies/mL). A negative result must be combined with clinical observations, patient history, and epidemiological information. The expected result is Negative.  Fact Sheet for Patients:  EntrepreneurPulse.com.au  Fact Sheet for Healthcare Providers:  IncredibleEmployment.be  This test is no t yet approved or cleared by the [place] FDA and  has been authorized for detection and/or diagnosis of SARS-CoV-2 by FDA under an Emergency Use Authorization (EUA). This EUA will remain  in effect (meaning this test  can be used) for the duration of the COVID-19 declaration under Section 564(b)(1) of the Act, 21 U.S.C.section 360bbb-3(b)(1), unless the authorization is terminated  or revoked sooner.       Influenza A by PCR NEGATIVE NEGATIVE  Final   Influenza B by PCR NEGATIVE NEGATIVE Final    Comment: (NOTE) The Xpert Xpress SARS-CoV-2/FLU/RSV plus assay is intended as an aid in the diagnosis of influenza from Nasopharyngeal swab specimens and should not be used as a sole basis for treatment. Nasal washings and aspirates are unacceptable for Xpert Xpress SARS-CoV-2/FLU/RSV testing.  Fact Sheet for Patients: EntrepreneurPulse.com.au  Fact Sheet for Healthcare Providers: IncredibleEmployment.be  This test is not yet approved or cleared by the [place] FDA and has been authorized for detection and/or diagnosis of SARS-CoV-2 by FDA under an Emergency Use Authorization (EUA). This EUA will remain in effect (meaning this test can be used) for the duration of the COVID-19 declaration under Section 564(b)(1) of the Act, 21 U.S.C. section 360bbb-3(b)(1), unless the authorization is terminated or revoked.  Performed at [facility], [place] [address] [place], [place] [address]   Culture, blood (routine x 2) Call MD if unable to obtain prior to antibiotics being given     Status: None (Preliminary result)   Collection Time: 03/21/21 12:24 AM   Specimen: BLOOD  Result Value Ref Range Status   Specimen Description   Final    BLOOD RIGHT ANTECUBITAL Performed at [facility] [address] [place], [place] [address]    Special Requests   Final    BOTTLES DRAWN AEROBIC ONLY Blood Culture adequate volume Performed at [facility] [address], [place], [place] [address]    Culture   Final    NO GROWTH 3 DAYS Performed at [facility], [place] [address] [place], [place] [address]    Report  Status PENDING  Incomplete  Culture, blood (routine x 2) Call MD if unable to obtain prior to antibiotics being given     Status: Abnormal   Collection Time: 03/21/21 12:24 AM   Specimen: BLOOD  Result Value Ref Range Status   Specimen Description   Final    BLOOD LEFT HAND Performed at [facility] [address] [place], [place] [address]    Special Requests   Final    BOTTLES DRAWN AEROBIC ONLY Blood Culture adequate volume Performed at [facility] [address], [place], [place] [address]    Culture  Setup Time (A)  Final    GRAM VARIABLE ROD AEROBIC BOTTLE ONLY CRITICAL RESULT CALLED TO, READ BACK BY AND VERIFIED WITH: E,WILLIAMSON PHARMD @1946  03/21/21 EB    Culture (A)  Final    BACILLUS SPECIES Standardized susceptibility testing for this organism is not available. Performed at [facility], [place] [address], [place],  [address]    Report Status 03/22/2021 FINAL  Final  Blood Culture ID Panel (Reflexed)     Status: None   Collection Time: 03/21/21 12:24 AM  Result Value Ref Range Status   Enterococcus faecalis NOT DETECTED NOT DETECTED Final   Enterococcus Faecium NOT DETECTED NOT DETECTED Final   Listeria monocytogenes NOT DETECTED NOT DETECTED Final   Staphylococcus species NOT DETECTED NOT DETECTED Final   Staphylococcus aureus (BCID) NOT DETECTED NOT DETECTED Final   Staphylococcus epidermidis NOT DETECTED NOT DETECTED Final   Staphylococcus lugdunensis NOT DETECTED NOT DETECTED Final   Streptococcus species NOT DETECTED NOT DETECTED Final   Streptococcus agalactiae NOT DETECTED NOT DETECTED Final   Streptococcus pneumoniae NOT DETECTED NOT DETECTED Final   Streptococcus pyogenes NOT DETECTED NOT DETECTED Final   A.calcoaceticus-baumannii NOT DETECTED NOT DETECTED Final   Bacteroides  fragilis NOT DETECTED NOT DETECTED Final   Enterobacterales NOT DETECTED NOT DETECTED Final   Enterobacter cloacae complex NOT  DETECTED NOT DETECTED Final   Escherichia coli NOT DETECTED NOT DETECTED Final   Klebsiella aerogenes NOT DETECTED NOT DETECTED Final   Klebsiella oxytoca NOT DETECTED NOT DETECTED Final   Klebsiella pneumoniae NOT DETECTED NOT DETECTED Final   Proteus species NOT DETECTED NOT DETECTED Final   Salmonella species NOT DETECTED NOT DETECTED Final   Serratia marcescens NOT DETECTED NOT DETECTED Final   Haemophilus influenzae NOT DETECTED NOT DETECTED Final   Neisseria meningitidis NOT DETECTED NOT DETECTED Final   Pseudomonas aeruginosa NOT DETECTED NOT DETECTED Final   Stenotrophomonas maltophilia NOT DETECTED NOT DETECTED Final   Candida albicans NOT DETECTED NOT DETECTED Final   Candida auris NOT DETECTED NOT DETECTED Final   Candida glabrata NOT DETECTED NOT DETECTED Final   Candida krusei NOT DETECTED NOT DETECTED Final   Candida parapsilosis NOT DETECTED NOT DETECTED Final   Candida tropicalis NOT DETECTED NOT DETECTED Final   Cryptococcus neoformans/gattii NOT DETECTED NOT DETECTED Final    Comment: Performed at [facility], [place] [address], [place], [place] [address]       Radiology Studies: DG CHEST PORT 1 VIEW  Result Date: 03/24/2021 CLINICAL DATA:  87 year old female with altered mental status. Sepsis. EXAM: PORTABLE CHEST 1 VIEW COMPARISON:  Portable chest 03/20/2021 and earlier. FINDINGS: Portable AP semi upright view at 0806 hours. Chronic left diaphragmatic hernia. Stable cardiac size and mediastinal contours. Stable lung volumes. Small right pleural effusion is more apparent. Small left pleural effusion is possible. But lung markings appear stable since 2018 with chronic interstitial prominence, and emphysema demonstrated on a 2020 chest CT. No pneumothorax. No air bronchograms. No acute osseous abnormality identified. Negative visible bowel gas pattern. IMPRESSION: 1. Small pleural effusions suspected but no other acute cardiopulmonary abnormality. 2. Chronic left  diaphragmatic hernia and pulmonary interstitial changes. Emphysema (ICD10-J43.9). Electronically Signed   By: Genevie Ann M.D.   On: 03/24/2021 08:50    Scheduled Meds:  apixaban  2.5 mg Oral BID   atenolol  25 mg Oral Daily   benazepril  20 mg Oral Daily   doxycycline  100 mg Oral Q12H   furosemide  40 mg Intravenous BID   gabapentin  100 mg Oral TID   melatonin  3 mg Oral QHS   pantoprazole  40 mg Oral Daily   rosuvastatin  10 mg Oral Daily   Continuous Infusions:  cefTRIAXone (ROCEPHIN)  IV 2 g (03/23/21 1652)     LOS: 4 days   Time spent: 28 minutes   Darliss Cheney, MD [facility]  03/24/2021, 2:02 PM   How to contact the [facility] Attending or Consulting provider [place] or covering provider during after hours [place], for this patient?  Check the care team in [facility] and look for a) attending/consulting TRH provider listed and b) the [facility] team listed. Page or secure chat [phone]. Log into [email or web address] and use [place]'s universal password to access. If you do not have the password, please contact the hospital operator. Locate the [facility] provider you are looking for under [facility] and page to a number that you can be directly reached. If you still have difficulty reaching the provider, please page the [facility] (Director on Call) for the Hospitalists listed on amion for assistance.

## 2021-03-24 NOTE — TOC Initial Note (Signed)
Transition of Care [facility]) - Initial/Assessment Note    Patient Details  Name: Laurie Pugh MRN: [id number] Date of Birth: 22-Nov-1931  Transition of [facility] CM/SW Contact:    Nathanial Arrighi, Meriam Sprague, RN Phone Number: 03/24/2021, 11:00 AM  Clinical Narrative:                 Pt from home alone but does have family support. Pt declines home health services at this time. She states she has a RW at home and declines a BSC.  Expected Discharge Plan: Home/Self Care Barriers to Discharge: Continued Medical Work up   Patient Goals and CMS Choice Patient states their goals for this hospitalization and ongoing recovery are:: To go home      Expected Discharge Plan and Services Expected Discharge Plan: Home/Self Care   Discharge Planning Services: CM Consult Post Acute Care Choice: Home Health Living arrangements for the past 2 months: Mobile Home                   Prior Living Arrangements/Services Living arrangements for the past 2 months: Mobile Home Lives with:: Self Patient language and need for interpreter reviewed:: Yes Do you feel safe going back to the place where you live?: Yes      Need for Family Participation in Patient Care: Yes (Comment) Care giver support system in place?: Yes (comment)   Criminal Activity/Legal Involvement Pertinent to Current Situation/Hospitalization: No - Comment as needed  Activities of Daily Living Home Assistive Devices/Equipment: None ADL Screening (condition at time of admission) Patient's cognitive ability adequate to safely complete daily activities?: Yes Is the patient deaf or have difficulty hearing?: No Does the patient have difficulty seeing, even when wearing glasses/contacts?: No Does the patient have difficulty concentrating, remembering, or making decisions?: No Patient able to express need for assistance with ADLs?: Yes Does the patient have difficulty dressing or bathing?: No Independently performs ADLs?: Yes (appropriate for  developmental age) Does the patient have difficulty walking or climbing stairs?: Yes (at this time yes but at baseline no) Weakness of Legs: Both Weakness of Arms/Hands: None  Permission Sought/Granted                  Emotional Assessment Appearance:: Appears stated age Attitude/Demeanor/Rapport: Guarded Affect (typically observed): Calm Orientation: : Oriented to Self, Oriented to Place, Oriented to  Time, Oriented to Situation Alcohol / Substance Use: Not Applicable Psych Involvement: No (comment)  Admission diagnosis:  Pneumonia [J18.9] Cholelithiasis [K80.20] Patient Active Problem List   Diagnosis Date Noted   Pneumonia 03/20/2021   Stenosis of carotid artery 02/12/2018   Subdural hematoma (HCC) 05/21/2017   Chronic anticoagulation 05/21/2017   Esophageal stricture 01/30/2017   Esophageal dysphagia    Esophageal obstruction due to food impaction    Sinus bradycardia 05/26/2014   PVC's (premature ventricular contractions) 05/26/2014   Paroxysmal SVT (supraventricular tachycardia) (HCC) 07/16/2013   Atrial fibrillation (HCC) 06/24/2013   Mixed hyperlipidemia 06/24/2013   Essential hypertension 06/24/2013   Angina pectoris, unstable (HCC) 06/24/2013   Neck pain 06/24/2013   DOE (dyspnea on exertion) 06/24/2013   PCP:  Rodrigo Ran, MD Pharmacy:   [facility] [id number]), [place] - [address] [place] ([place]) [place] [address] Phone: [phone] Fax: [phone]  [facility]  [facility]) - [place], [place] - [address] [place] [address] Phone: [phone] Fax: [phone]  Social Determinants of Health (SDOH) Interventions    Readmission Risk Interventions No flowsheet data found.   

## 2021-03-24 NOTE — Progress Notes (Signed)
Physical Therapy Treatment Patient Details Name: Laurie Pugh MRN: [id number] DOB: [birth year] Today's Date: 03/24/2021    History of Present Illness Pt admitted from home with AMS and dx with severe sepsis 2* PNA.  Pt with hx of a-fib, CAD, and lumbar vertebroplasty in 2020.    PT Comments    Progressing but remains unsteady and weaker than baseline. Pt needed assistance for ADLs on today-pt states "hold me". Requires at least Min guard for safety when mobilizing. She doesn't have anyone in the home to assist her-she lives alone. Continue to recommend HHPT and intermittent supervision/assistance.     Follow Up Recommendations  Home health PT;Supervision - Intermittent     Equipment Recommendations  Rolling walker with 5" wheels    Recommendations for Other Services OT consult     Precautions / Restrictions Precautions Precautions: Fall Precaution Comments: watch HR and O2 SATs Restrictions Weight Bearing Restrictions: No    Mobility  Bed Mobility Overal bed mobility: Needs Assistance Bed Mobility: Supine to Sit     Supine to sit: Supervision     General bed mobility comments: Increased time. Task is effortful for pt.    Transfers Overall transfer level: Needs assistance Equipment used: Rolling walker (2 wheeled);None   Sit to Stand: Min guard Stand pivot transfers: Min guard       General transfer comment: Close Min guard assist. Cues for safety, hand placement. Increased time.  Ambulation/Gait Ambulation/Gait assistance: Min guard Gait Distance (Feet): 200 Feet Assistive device: 4-wheeled walker Gait Pattern/deviations: Step-through pattern;Decreased stride length     General Gait Details: Min guard A. Fair gait speed. Remained on [place] O2. Pt denied dyspnea.   Stairs             Wheelchair Mobility    Modified Rankin (Stroke Patients Only)       Balance Overall balance assessment: Needs assistance         Standing balance  support: Bilateral upper extremity supported Standing balance-Leahy Scale: Fair                              Cognition Arousal/Alertness: Awake/alert Behavior During Therapy: WFL for tasks assessed/performed Overall Cognitive Status: Within Functional Limits for tasks assessed                                        Exercises      General Comments        Pertinent Vitals/Pain Pain Assessment: Faces Faces Pain Scale: Hurts little more Pain Location: back-chronic Pain Descriptors / Indicators: Discomfort Pain Intervention(s): Limited activity within patient's tolerance;Monitored during session;Repositioned    Home Living                      Prior Function            PT Goals (current goals can now be found in the care plan section) Progress towards PT goals: Progressing toward goals    Frequency    Min 3X/week      PT Plan Current plan remains appropriate    Co-evaluation              AM-PAC PT "6 Clicks" Mobility   Outcome Measure  Help needed turning from your back to your side while in a flat bed without using bedrails?: None Help needed moving  from lying on your back to sitting on the side of a flat bed without using bedrails?: None Help needed moving to and from a bed to a chair (including a wheelchair)?: A Little Help needed standing up from a chair using your arms (e.g., wheelchair or bedside chair)?: A Little Help needed to walk in hospital room?: A Little Help needed climbing 3-5 steps with a railing? : A Little 6 Click Score: 20    End of Session Equipment Utilized During Treatment: Gait belt Activity Tolerance: Patient tolerated treatment well Patient left: in chair;with call bell/phone within reach;with chair alarm set   PT Visit Diagnosis: Muscle weakness (generalized) (M62.81);Difficulty in walking, not elsewhere classified (R26.2)     Time: 1110-1140 PT Time Calculation (min) (ACUTE ONLY): 30  min  Charges:  $Gait Training: 23-37 mins                         Faye Ramsay, PT Acute Rehabilitation  Office: [phone] Pager: [phone]

## 2021-03-24 NOTE — Progress Notes (Signed)
Occupational Therapy Treatment Patient Details Name: Laurie Pugh MRN: [id number] DOB: June 12, 1932 Today's Date: 03/24/2021    History of present illness Pt admitted from home with AMS and dx with severe sepsis 2* PNA.  Pt with hx of a-fib, CAD, and lumbar vertebroplasty in 2020.   OT comments  Patient is a pleasant 87 year old female who is in hospital for above diagnosis. Patient is progressing towards goals with O2 saturation maintaining in 90s with all tasks on this date. Patient required encouragement from family who are present in room (daughter and granddaughter) to participate in session today. Patient required min A for supine to sit on edge of bed with education on how to read pulse Ox. Patient verbalized understanding. Patient required min guard to transfer from edge of bed to recliner in room. Patient completed 5 sit to stands with education on proper hand placement for transfers. Patient required re education with each trial. Patient declined to use toilet at this time. Patient and family were educated on using commode more for urination to gain functional strength transition back home alone. Patient reported that she needed to continue to use it at this time. Patients family verbalized understanding. Patient will need increased assistance at home and OT [facility] services unless more progress can be made towards functional independence.    Follow Up Recommendations  Home health OT;Supervision - Intermittent                Precautions / Restrictions Precautions Precautions: Fall Precaution Comments: watch HR and O2 SATs Restrictions Weight Bearing Restrictions: No       Mobility Bed Mobility Overal bed mobility: Needs Assistance Bed Mobility: Supine to Sit     Supine to sit: Supervision Sit to supine: Supervision;HOB elevated   General bed mobility comments: Increased time    Transfers Overall transfer level: Needs assistance Equipment used: Rolling walker (2  wheeled);None Transfers: Sit to/from [employer] to Stand: Min guard Stand pivot transfers: Min guard       General transfer comment: education provided for hand placement and safety.    Balance Overall balance assessment: Needs assistance   Sitting balance-Leahy Scale: Good     Standing balance support: Bilateral upper extremity supported Standing balance-Leahy Scale: Fair                             ADL either performed or assessed with clinical judgement   ADL   Eating/Feeding: Independent;Sitting                                     General ADL Comments: patients O2 saturation maintains in 90s with all movements on this date with supplemental O2 in place.     Vision Patient Visual Report: No change from baseline                Cognition Arousal/Alertness: Awake/alert Behavior During Therapy: WFL for tasks assessed/performed Overall Cognitive Status: Within Functional Limits for tasks assessed                                 General Comments: patients daughter and grandaughter were present during session.  Pertinent Vitals/ Pain       Pain Assessment: No/denies pain Faces Pain Scale: Hurts little more Pain Location: back-chronic Pain Descriptors / Indicators: Discomfort Pain Intervention(s): Limited activity within patient's tolerance;Monitored during session;Repositioned                                                          Frequency  Min 2X/week        Progress Toward Goals  OT Goals(current goals can now be found in the care plan section)  Progress towards OT goals: Progressing toward goals  Acute Rehab OT Goals Patient Stated Goal: patient wants to get back home OT Goal Formulation: With patient Time For Goal Achievement: 04/05/21 Potential to Achieve Goals: Good  Plan Discharge plan needs to be updated                     AM-PAC OT "6 Clicks" Daily Activity     Outcome Measure   Help from another person eating meals?: None Help from another person taking care of personal grooming?: A Little Help from another person toileting, which includes using toliet, bedpan, or urinal?: A Little Help from another person bathing (including washing, rinsing, drying)?: A Little Help from another person to put on and taking off regular upper body clothing?: None Help from another person to put on and taking off regular lower body clothing?: A Little 6 Click Score: 20    End of Session Equipment Utilized During Treatment: Gait belt;Oxygen  OT Visit Diagnosis: Muscle weakness (generalized) (M62.81)   Activity Tolerance Patient limited by fatigue   Patient Left in chair;with call bell/phone within reach;with chair alarm set   Nurse Communication  (nurse made aware of poor connection to O2 probe. pulse ox was used during session.)        Time: 0355-9741 OT Time Calculation (min): 14 min  Charges: OT General Charges $OT Visit: 1 Visit OT Treatments $Therapeutic Activity: 8-22 mins   Laurie Pugh OTR/L, MS Acute Rehabilitation Department Office# [phone] Pager# [phone]    [facility] Guest Onica Davidovich 03/24/2021, 2:59 PM

## 2021-03-25 ENCOUNTER — Inpatient Hospital Stay (HOSPITAL_COMMUNITY): Payer: Medicare Other

## 2021-03-25 LAB — BASIC METABOLIC PANEL
Anion gap: 13 (ref 5–15)
Anion gap: 16 — ABNORMAL HIGH (ref 5–15)
BUN: 18 mg/dL (ref 8–23)
BUN: 23 mg/dL (ref 8–23)
CO2: 33 mmol/L — ABNORMAL HIGH (ref 22–32)
CO2: 28 mmol/L (ref 22–32)
Calcium: 9.7 mg/dL (ref 8.9–10.3)
Calcium: 10.1 mg/dL (ref 8.9–10.3)
Chloride: 90 mmol/L — ABNORMAL LOW (ref 98–111)
Chloride: 91 mmol/L — ABNORMAL LOW (ref 98–111)
Creatinine, Ser: 0.7 mg/dL (ref 0.44–1.00)
Creatinine, Ser: 0.91 mg/dL (ref 0.44–1.00)
GFR, Estimated: >60 mL/min (ref >60)
GFR, Estimated: >60 mL/min (ref >60)
Glucose, Bld: 106 mg/dL — ABNORMAL HIGH (ref 70–99)
Glucose, Bld: 116 mg/dL — ABNORMAL HIGH (ref 70–99)
Potassium: 3.3 mmol/L — ABNORMAL LOW (ref 3.5–5.1)
Potassium: 5.1 mmol/L (ref 3.5–5.1)
Sodium: 136 mmol/L (ref 135–145)
Sodium: 135 mmol/L (ref 135–145)

## 2021-03-25 LAB — GLUCOSE, CAPILLARY: Glucose-Capillary: 150 mg/dL — ABNORMAL HIGH (ref 70–99)

## 2021-03-25 LAB — LACTIC ACID, PLASMA
Lactic Acid, Venous: 2.4 mmol/L (ref 0.5–1.9)
Lactic Acid, Venous: 1.5 mmol/L (ref 0.5–1.9)

## 2021-03-25 LAB — PROCALCITONIN: Procalcitonin: 0.14 ng/mL

## 2021-03-25 LAB — BRAIN NATRIURETIC PEPTIDE: B Natriuretic Peptide: 169.7 pg/mL — ABNORMAL HIGH (ref 0.0–100.0)

## 2021-03-25 MED ORDER — POTASSIUM CHLORIDE CRYS ER 20 MEQ PO TBCR
40.0000 meq | EXTENDED_RELEASE_TABLET | ORAL | Status: AC
  Administered 2021-03-25 (×2): 40 meq via ORAL
  Filled 2021-03-25 (×2): qty 2

## 2021-03-25 NOTE — Progress Notes (Signed)
Occupational Therapy Treatment Patient Details Name: Laurie Pugh MRN: [id number] DOB: [birth year] Today's Date: 03/25/2021    History of present illness Pt admitted from home with AMS and dx with severe sepsis 2* PNA.  Pt with hx of a-fib, CAD, and lumbar vertebroplasty in 2020.   OT comments  Patient was seated in recliner in room just after breakfast at start of session. Patient reported she felt off this morning. Patients O2 saturation was 96% on 3L and pulse rate was 98 bpm sitting in chair. Patient's pulse rate was noted to have large jumps while sitting and talking with patient ranging from 90 bpm to 133 bpm without moving. Patient reported new onset of blurry vision, "cloudy stomach" and sweating. Patient was not noted to have any redness or visible sweating on face or BUE. Patient was provided with cool rag for face while nursing was called. Patient required min guard for transfer back to bed with nursing staff present in room. Patient required min A for sit to supine in bed with increased time and cues to complete task. Patient was left in bed with nursing staff at end of session.  Will attempt to check back later if able.  Follow Up Recommendations  Home health OT;Supervision - Intermittent                Precautions / Restrictions Precautions Precautions: Fall Precaution Comments: watch HR and O2 SATs       Mobility Bed Mobility Overal bed mobility: Needs Assistance Bed Mobility: Sit to Supine       Sit to supine: Min guard   General bed mobility comments: Increased time    Transfers Overall transfer level: Needs assistance Equipment used: None Transfers: Sit to/from [employer] Sit to Stand: Min guard Stand pivot transfers: Min guard       General transfer comment: education provided for hand placement and safety.                                               ADL either performed or assessed with clinical judgement    ADL Overall ADL's : Needs assistance/impaired Eating/Feeding: Independent;Sitting   Grooming: Wash/dry hands;Wash/dry face;Modified independent;Set up;Sitting                                       Vision Patient Visual Report: Blurring of vision (patient reported having recent new onset of blurry vision with R eye worse than L when checking individuly. nursing was immediately informed.)                Cognition Arousal/Alertness: Awake/alert Behavior During Therapy: WFL for tasks assessed/performed Overall Cognitive Status: Within Functional Limits for tasks assessed                                                            Pertinent Vitals/ Pain       Pain Assessment: No/denies pain   Frequency  Min 2X/week        Progress Toward Goals  OT Goals(current goals can now be found in the care plan  section)  Progress towards OT goals: Progressing toward goals  Acute Rehab OT Goals Patient Stated Goal: patient wants to get back home OT Goal Formulation: With patient Time For Goal Achievement: 04/05/21 Potential to Achieve Goals: Good  Plan Discharge plan needs to be updated                     AM-PAC OT "6 Clicks" Daily Activity     Outcome Measure   Help from another person eating meals?: None Help from another person taking care of personal grooming?: A Little Help from another person toileting, which includes using toliet, bedpan, or urinal?: A Little Help from another person bathing (including washing, rinsing, drying)?: A Little Help from another person to put on and taking off regular upper body clothing?: None Help from another person to put on and taking off regular lower body clothing?: A Little 6 Click Score: 20    End of Session Equipment Utilized During Treatment: Oxygen  OT Visit Diagnosis: Muscle weakness (generalized) (M62.81)   Activity Tolerance Patient limited by fatigue;Other (comment)  (patient was placed back in bed with nursing staff present due to new onset of symptoms.)   Patient Left in bed;Other (comment);with call bell/phone within reach (with nurse in room)   Nurse Communication     Patient was left in room with nursing staff present at end of session to address new onset of symptoms.         Time: 5009-3818 OT Time Calculation (min): 17 min  Charges: OT General Charges $OT Visit: 1 Visit OT Treatments $Therapeutic Activity: 8-22 mins  Sharyn Blitz OTR/L, MS Acute Rehabilitation Department Office# [phone] Pager# [phone]    [facility] Guest [place] 03/25/2021, 9:50 AM

## 2021-03-25 NOTE — Progress Notes (Addendum)
PROGRESS NOTE    Laurie Pugh  WNI:[id number] DOB: 1931/11/10 DOA: 03/20/2021 PCP: Crist Infante, MD   Brief Narrative:  Laurie Pugh is a 87 y.o. female with medical history significant of paroxysmal atrial fibrillation on Eliquis, coronary artery disease, GERD, esophageal stricture, essential hypertension, hyperlipidemia, PSVT, was brought in by her daughter due to observed confusion.  She complained of epigastric pain in the ER and shortness of breath.  Denied any fever or sick contact.  Patient was evaluated and found to have evidence of pneumonia.  She also met severe sepsis criteria based on tachycardia, hypoxia and leukocytosis. she is not on oxygen.Marland Kitchen She was COVID-19 negative.  She was admitted to the hospital with community-acquired pneumonia and metabolic encephalopathy.  She was started on Rocephin and doxycycline.  Did not improve much and required oxygen.  BNP was elevated.  Chest x-ray also suggested possible acute pulmonary edema.  She was started on Lasix 40 mg IV twice daily and had decent diuresis and she started feeling better.  Transthoracic echo showed normal ejection fraction and no wall motion would not be.  BNP was elevated so she had combination of pneumonia and acute on chronic congestive heart failure with preserved ejection fraction.  She was evaluated by PT OT who recommended home health.   Assessment & Plan:   Principal Problem:   Pneumonia Active Problems:   Atrial fibrillation ([place])   Essential hypertension   Esophageal stricture   Chronic anticoagulation  Severe sepsis and acute hypoxic respiratory failure secondary to community-acquired pneumonia/possible aspiration pneumonia: Patient met severe sepsis criteria based on tachypnea, hypoxia and leukocytosis.  Patient stated that although she feels better, she had very minimal shortness of breath and she was only on 0.5 L of oxygen.  Plan was to discharge her after ambulatory oximetry however when she was  working with OT, she became dizzy, tachycardic with A. fib and RVR and short of breath and diaphoretic.  Plan of discharge has been canceled for that reason.  Her lungs are sounding much better.  She has had good diuresis.  She will complete 5 days of antibiotics today for pneumonia.  Acute pulmonary edema: Echo with normal ejection fraction.  Chest x-ray and lung exam improving.  Continue diuretics for another day.  She has had good diuresis.  Acute metabolic encephalopathy: Secondary to pneumonia and hypoxia.  Resolved.  She is fully alert and oriented.    Hypokalemia: 3.3.  Will replace.   essential hypertension: Fairly controlle for most part, slightly elevated this morning.  Continue home medications which include atenolol, chlorthalidone and Lotensin.   atrial fibrillation: Rate is controlled.  Continue Eliquis.   esophageal stricture: Continue PPIs   GERD: Continue PPI.   hyperlipidemia: Continue Crestor.  Hypokalemia: Resolved   chronic pain syndrome: Has been on Percocet at home.  Continue.  DVT prophylaxis: apixaban (ELIQUIS) tablet 2.5 mg Start: 03/21/21 0045   Code Status: Full Code  Family Communication: None present at bedside.  Plan of care discussed with the patient and then with her daughter Santiago Glad over the phone.  Status is: Inpatient  Remains inpatient appropriate because:Inpatient level of care appropriate due to severity of illness  Dispo: The patient is from: Home              Anticipated d/c is to: Home              Patient currently is not medically stable to d/c.   Difficult to place patient No  Estimated body mass index is 21.26 kg/m as calculated from the following:   Height as of this encounter: $RemoveBeforeD'5\' 3"'TRyMXBabmCaZEy$  (1.6 m).   Weight as of this encounter: 54.4 kg.      Nutritional status:               Consultants:  None  Procedures:  None  Antimicrobials:  Anti-infectives (From admission, onward)    Start     Dose/Rate Route  Frequency Ordered Stop   03/22/21 1800  doxycycline (VIBRA-TABS) tablet 100 mg        100 mg Oral Every 12 hours 03/22/21 1447     03/21/21 1800  doxycycline (VIBRAMYCIN) 100 mg in sodium chloride 0.9 % 250 mL IVPB  Status:  Discontinued        100 mg 125 mL/hr over 120 Minutes Intravenous Every 12 hours 03/20/21 2224 03/22/21 1447   03/21/21 1700  cefTRIAXone (ROCEPHIN) 2 g in sodium chloride 0.9 % 100 mL IVPB        2 g 200 mL/hr over 30 Minutes Intravenous Every 24 hours 03/20/21 2224 03/26/21 1659   03/20/21 1715  cefTRIAXone (ROCEPHIN) 1 g in sodium chloride 0.9 % 100 mL IVPB        1 g 200 mL/hr over 30 Minutes Intravenous  Once 03/20/21 1701 03/20/21 1919   03/20/21 1715  azithromycin (ZITHROMAX) 500 mg in sodium chloride 0.9 % 250 mL IVPB        500 mg 250 mL/hr over 60 Minutes Intravenous  Once 03/20/21 1701 03/20/21 1919          Subjective: Seen and examined earlier today and was feeling better however she had an episode of dizziness with diaphoresis and A. fib with RVR after she started working with OT today.  Plan of discharge canceled.  Objective: Vitals:   03/24/21 2050 03/25/21 0436 03/25/21 0932 03/25/21 1200  BP:  128/85 (!) 130/117 100/71  Pulse:  76 (!) 112 93  Resp:  $Remo'18 20 20  'lyhLS$ Temp: (!) 97.4 F (36.3 C) 98 F (36.7 C) 100.2 F (37.9 C) (!) 96.2 F (35.7 C)  TempSrc: Oral Oral Rectal Oral  SpO2:  94% 93% 91%  Weight:      Height:        Intake/Output Summary (Last 24 hours) at 03/25/2021 1337 Last data filed at 03/25/2021 1100 Gross per 24 hour  Intake 240 ml  Output 1900 ml  Net -1660 ml    Filed Weights   03/20/21 1446  Weight: 54.4 kg    Examination:  General exam: Appears calm and comfortable  Respiratory system: Very minimal and faint bibasilar crackles. Respiratory effort normal. Cardiovascular system: S1 & S2 heard, irregularly irregular rate and rhythm. No JVD, murmurs, rubs, gallops or clicks. No pedal edema. Gastrointestinal  system: Abdomen is nondistended, soft and nontender. No organomegaly or masses felt. Normal bowel sounds heard. Central nervous system: Alert and oriented. No focal neurological deficits. Extremities: Symmetric 5 x 5 power. Skin: No rashes, lesions or ulcers.  Psychiatry: Judgement and insight appear normal. Mood & affect appropriate.    Data Reviewed: I have personally reviewed following labs and imaging studies  CBC: Recent Labs  Lab 03/20/21 1538 03/21/21 0024 03/23/21 0507 03/24/21 0538  WBC 15.3* 15.6* 10.2 8.7  NEUTROABS 13.5* 13.3* 8.2* 6.6  HGB 13.6 14.0 13.5 14.7  HCT 42.6 44.7 43.7 46.2*  MCV 95.1 97.2 97.8 95.9  PLT 149* 135* 149* 413    Basic Metabolic Panel: Recent  Labs  Lab 03/20/21 1538 03/21/21 0024 03/23/21 0507 03/24/21 0538 03/25/21 0543  NA 142 140 139 139 136  K 4.0 4.5 3.2* 4.1 3.3*  CL 107 108 101 101 90*  CO2 $Re'27 23 27 26 'JON$ 33*  GLUCOSE 147* 100* 80 81 106*  BUN $Re'20 18 17 15 18  'YHp$ CREATININE 0.68 0.48 0.64 0.58 0.70  CALCIUM 8.8* 8.5* 8.4* 9.2 9.7    GFR: Estimated Creatinine Clearance: 41.8 mL/min (by C-G formula based on SCr of 0.7 mg/dL). Liver Function Tests: Recent Labs  Lab 03/20/21 1538 03/21/21 0024  AST 308* 195*  ALT 103* 88*  ALKPHOS 84 86  BILITOT 3.5* 4.6*  PROT 6.3* 6.0*  ALBUMIN 3.6 3.3*    Recent Labs  Lab 03/20/21 1627  LIPASE 18    Recent Labs  Lab 03/20/21 1627  AMMONIA 23    Coagulation Profile: Recent Labs  Lab 03/20/21 1534  INR 1.4*    Cardiac Enzymes: No results for input(s): CKTOTAL, CKMB, CKMBINDEX, TROPONINI in the last 168 hours. BNP (last 3 results) No results for input(s): PROBNP in the last 8760 hours. HbA1C: No results for input(s): HGBA1C in the last 72 hours. CBG: Recent Labs  Lab 03/20/21 1600 03/25/21 1207  GLUCAP 144* 150*    Lipid Profile: No results for input(s): CHOL, HDL, LDLCALC, TRIG, CHOLHDL, LDLDIRECT in the last 72 hours. Thyroid Function Tests: No results for  input(s): TSH, T4TOTAL, FREET4, T3FREE, THYROIDAB in the last 72 hours. Anemia Panel: No results for input(s): VITAMINB12, FOLATE, FERRITIN, TIBC, IRON, RETICCTPCT in the last 72 hours. Sepsis Labs: Recent Labs  Lab 03/20/21 1538 03/22/21 1106  PROCALCITON  --  1.06  LATICACIDVEN 1.5  --      Recent Results (from the past 240 hour(s))  Urine culture     Status: Abnormal   Collection Time: 03/20/21  3:38 PM   Specimen: Urine, Random  Result Value Ref Range Status   Specimen Description   Final    URINE, RANDOM Performed at [facility] [address], [place], [place] [address]    Special Requests   Final    NONE Performed at [facility], [place] [address] [place], [place] [address]    Culture MULTIPLE SPECIES PRESENT, SUGGEST RECOLLECTION (A)  Final   Report Status 03/22/2021 FINAL  Final  Resp Panel by RT-PCR (Flu A&B, Covid) Nasopharyngeal Swab     Status: None   Collection Time: 03/20/21  3:38 PM   Specimen: Nasopharyngeal Swab; Nasopharyngeal(NP) swabs in vial transport medium  Result Value Ref Range Status   SARS Coronavirus 2 by RT PCR NEGATIVE NEGATIVE Final    Comment: (NOTE) SARS-CoV-2 target nucleic acids are NOT DETECTED.  The SARS-CoV-2 RNA is generally detectable in upper respiratory specimens during the acute phase of infection. The lowest concentration of SARS-CoV-2 viral copies this assay can detect is 138 copies/mL. A negative result does not preclude SARS-Cov-2 infection and should not be used as the sole basis for treatment or other patient management decisions. A negative result may occur with  improper specimen collection/handling, submission of specimen other than nasopharyngeal swab, presence of viral mutation(s) within the areas targeted by this assay, and inadequate number of viral copies(<138 copies/mL). A negative result must be combined with clinical observations, patient history, and  epidemiological information. The expected result is Negative.  Fact Sheet for Patients:  EntrepreneurPulse.com.au  Fact Sheet for Healthcare Providers:  IncredibleEmployment.be  This test is no t yet approved or  cleared by the [place] and  has been authorized for detection and/or diagnosis of SARS-CoV-2 by FDA under an Emergency Use Authorization (EUA). This EUA will remain  in effect (meaning this test can be used) for the duration of the COVID-19 declaration under Section 564(b)(1) of the Act, 21 U.S.C.section 360bbb-3(b)(1), unless the authorization is terminated  or revoked sooner.       Influenza A by PCR NEGATIVE NEGATIVE Final   Influenza B by PCR NEGATIVE NEGATIVE Final    Comment: (NOTE) The Xpert Xpress SARS-CoV-2/FLU/RSV plus assay is intended as an aid in the diagnosis of influenza from Nasopharyngeal swab specimens and should not be used as a sole basis for treatment. Nasal washings and aspirates are unacceptable for Xpert Xpress SARS-CoV-2/FLU/RSV testing.  Fact Sheet for Patients: EntrepreneurPulse.com.au  Fact Sheet for Healthcare Providers: IncredibleEmployment.be  This test is not yet approved or cleared by the [place] FDA and has been authorized for detection and/or diagnosis of SARS-CoV-2 by FDA under an Emergency Use Authorization (EUA). This EUA will remain in effect (meaning this test can be used) for the duration of the COVID-19 declaration under Section 564(b)(1) of the Act, 21 U.S.C. section 360bbb-3(b)(1), unless the authorization is terminated or revoked.  Performed at [facility], [place] [address] [place], [place] [address]   Culture, blood (routine x 2) Call MD if unable to obtain prior to antibiotics being given     Status: None (Preliminary result)   Collection Time: 03/21/21 12:24 AM   Specimen: BLOOD  Result Value Ref Range Status    Specimen Description   Final    BLOOD RIGHT ANTECUBITAL Performed at [facility] [address], [place], [name] [address]    Special Requests   Final    BOTTLES DRAWN AEROBIC ONLY Blood Culture adequate volume Performed at [facility] [address], [place], [place] [address]    Culture   Final    NO GROWTH 4 DAYS Performed at [facility], [place] [address] [place], [place] [address]    Report Status PENDING  Incomplete  Culture, blood (routine x 2) Call MD if unable to obtain prior to antibiotics being given     Status: Abnormal   Collection Time: 03/21/21 12:24 AM   Specimen: BLOOD  Result Value Ref Range Status   Specimen Description   Final    BLOOD LEFT HAND Performed at [facility] [address], [place], [place] [address]    Special Requests   Final    BOTTLES DRAWN AEROBIC ONLY Blood Culture adequate volume Performed at [facility] [address], [place], [place] [address]    Culture  Setup Time (A)  Final    GRAM VARIABLE ROD AEROBIC BOTTLE ONLY CRITICAL RESULT CALLED TO, READ BACK BY AND VERIFIED WITH: E,WILLIAMSON PHARMD @1946  03/21/21 EB    Culture (A)  Final    BACILLUS SPECIES Standardized susceptibility testing for this organism is not available. Performed at [facility], [place] [address] [place],  [address]    Report Status 03/22/2021 FINAL  Final  Blood Culture ID Panel (Reflexed)     Status: None   Collection Time: 03/21/21 12:24 AM  Result Value Ref Range Status   Enterococcus faecalis NOT DETECTED NOT DETECTED Final   Enterococcus Faecium NOT DETECTED NOT DETECTED Final   Listeria monocytogenes NOT DETECTED NOT DETECTED Final   Staphylococcus species NOT DETECTED NOT DETECTED Final  Staphylococcus aureus (BCID) NOT DETECTED NOT DETECTED Final   Staphylococcus epidermidis NOT DETECTED NOT DETECTED Final   Staphylococcus lugdunensis NOT DETECTED  NOT DETECTED Final   Streptococcus species NOT DETECTED NOT DETECTED Final   Streptococcus agalactiae NOT DETECTED NOT DETECTED Final   Streptococcus pneumoniae NOT DETECTED NOT DETECTED Final   Streptococcus pyogenes NOT DETECTED NOT DETECTED Final   A.calcoaceticus-baumannii NOT DETECTED NOT DETECTED Final   Bacteroides fragilis NOT DETECTED NOT DETECTED Final   Enterobacterales NOT DETECTED NOT DETECTED Final   Enterobacter cloacae complex NOT DETECTED NOT DETECTED Final   Escherichia coli NOT DETECTED NOT DETECTED Final   Klebsiella aerogenes NOT DETECTED NOT DETECTED Final   Klebsiella oxytoca NOT DETECTED NOT DETECTED Final   Klebsiella pneumoniae NOT DETECTED NOT DETECTED Final   Proteus species NOT DETECTED NOT DETECTED Final   Salmonella species NOT DETECTED NOT DETECTED Final   Serratia marcescens NOT DETECTED NOT DETECTED Final   Haemophilus influenzae NOT DETECTED NOT DETECTED Final   Neisseria meningitidis NOT DETECTED NOT DETECTED Final   Pseudomonas aeruginosa NOT DETECTED NOT DETECTED Final   Stenotrophomonas maltophilia NOT DETECTED NOT DETECTED Final   Candida albicans NOT DETECTED NOT DETECTED Final   Candida auris NOT DETECTED NOT DETECTED Final   Candida glabrata NOT DETECTED NOT DETECTED Final   Candida krusei NOT DETECTED NOT DETECTED Final   Candida parapsilosis NOT DETECTED NOT DETECTED Final   Candida tropicalis NOT DETECTED NOT DETECTED Final   Cryptococcus neoformans/gattii NOT DETECTED NOT DETECTED Final    Comment: Performed at [facility], [address] [place], [place] [address]       Radiology Studies: DG CHEST PORT 1 VIEW  Result Date: 03/24/2021 CLINICAL DATA:  87 year old female with altered mental status. Sepsis. EXAM: PORTABLE CHEST 1 VIEW COMPARISON:  Portable chest 03/20/2021 and earlier. FINDINGS: Portable AP semi upright view at 0806 hours. Chronic left diaphragmatic hernia. Stable cardiac size and mediastinal contours. Stable lung  volumes. Small right pleural effusion is more apparent. Small left pleural effusion is possible. But lung markings appear stable since 2018 with chronic interstitial prominence, and emphysema demonstrated on a 2020 chest CT. No pneumothorax. No air bronchograms. No acute osseous abnormality identified. Negative visible bowel gas pattern. IMPRESSION: 1. Small pleural effusions suspected but no other acute cardiopulmonary abnormality. 2. Chronic left diaphragmatic hernia and pulmonary interstitial changes. Emphysema (ICD10-J43.9). Electronically Signed   By: Genevie Ann M.D.   On: 03/24/2021 08:50    Scheduled Meds:  apixaban  2.5 mg Oral BID   atenolol  25 mg Oral Daily   benazepril  20 mg Oral Daily   doxycycline  100 mg Oral Q12H   furosemide  40 mg Intravenous BID   gabapentin  100 mg Oral TID   melatonin  3 mg Oral QHS   pantoprazole  40 mg Oral Daily   rosuvastatin  10 mg Oral Daily   Continuous Infusions:  cefTRIAXone (ROCEPHIN)  IV 2 g (03/24/21 1729)     LOS: 5 days   Time spent: 27 minutes   Darliss Cheney, MD [facility]  03/25/2021, 1:37 PM   How to contact the [facility] Attending or Consulting provider [place] or covering provider during after hours [place], for this patient?  Check the care team in [facility] and look for a) attending/consulting TRH provider listed and b) the [facility] team listed. Page or secure chat [phone]. Log into [email or web address] and use 's universal password to access. If you  do not have the password, please contact the hospital operator. Locate the TRH provider you are looking for under [facility] and page to a number that you can be directly reached. If you still have difficulty reaching the provider, please page the DOC (Director on Call) for the Hospitalists listed on amion for assistance.  

## 2021-03-25 NOTE — Progress Notes (Signed)
Patient was working with occupational therapy this morning around 0920. Nurse was called into the room by OT. Found patient sitting up in the chair reports feeling dizzy, diaphoretic and having blurred vision, hands cool to touch. 2 nurses and OT staff assisted patient back to bed. Vital signs were taken BP elevated and HR fluctuating. Upon auscultation of the heart sounds, resembles AFIB. Patient has a history of afib and states, "im in AFIB I can feel it." MD Pahwani, Daleen Bo was paged and notified of all of the above. No new orders were placed. Patient now sitting up in bed taking her pills. HR still elevated. Reports symptoms are improving.

## 2021-03-26 DIAGNOSIS — L8990 Pressure ulcer of unspecified site, unspecified stage: Secondary | ICD-10-CM | POA: Insufficient documentation

## 2021-03-26 LAB — BASIC METABOLIC PANEL
Anion gap: 13 (ref 5–15)
BUN: 25 mg/dL — ABNORMAL HIGH (ref 8–23)
CO2: 30 mmol/L (ref 22–32)
Calcium: 9.7 mg/dL (ref 8.9–10.3)
Chloride: 92 mmol/L — ABNORMAL LOW (ref 98–111)
Creatinine, Ser: 0.82 mg/dL (ref 0.44–1.00)
GFR, Estimated: >60 mL/min (ref >60)
Glucose, Bld: 98 mg/dL (ref 70–99)
Potassium: 4.9 mmol/L (ref 3.5–5.1)
Sodium: 135 mmol/L (ref 135–145)

## 2021-03-26 LAB — CULTURE, BLOOD (ROUTINE X 2)
Culture: NO GROWTH
Special Requests: ADEQUATE

## 2021-03-26 LAB — MAGNESIUM: Magnesium: 1.7 mg/dL (ref 1.7–2.4)

## 2021-03-26 MED ORDER — ATENOLOL 25 MG PO TABS
25.0000 mg | ORAL_TABLET | Freq: Two times a day (BID) | ORAL | Status: DC
  Administered 2021-03-26 – 2021-03-28 (×4): 25 mg via ORAL
  Filled 2021-03-26 (×4): qty 1

## 2021-03-26 NOTE — NC FL2 (Signed)
[place] MEDICAID FL2 LEVEL OF CARE SCREENING TOOL     IDENTIFICATION  Patient Name: LENICE KOPER Birthdate: [birth year] Sex: female Admission Date (Current Location): 03/20/2021  [facility] and [place] Number:  Producer, television/film/video and Address:  [facility],  [address] [place], [place] [address]      Provider Number: [id number]  Attending Physician Name and Address:  Hughie Closs, MD  Relative Name and Phone Number:       Current Level of Care: Hospital Recommended Level of Care: Skilled Nursing Facility Prior Approval Number:    Date Approved/Denied:   PASRR Number: [id number]  Discharge Plan: SNF    Current Diagnoses: Patient Active Problem List   Diagnosis Date Noted   Pressure injury of skin 03/26/2021   Pneumonia 03/20/2021   Stenosis of carotid artery 02/12/2018   Subdural hematoma (HCC) 05/21/2017   Chronic anticoagulation 05/21/2017   Esophageal stricture 01/30/2017   Esophageal dysphagia    Esophageal obstruction due to food impaction    Sinus bradycardia 05/26/2014   PVC's (premature ventricular contractions) 05/26/2014   Paroxysmal SVT (supraventricular tachycardia) (HCC) 07/16/2013   Atrial fibrillation (HCC) 06/24/2013   Mixed hyperlipidemia 06/24/2013   Essential hypertension 06/24/2013   Angina pectoris, unstable (HCC) 06/24/2013   Neck pain 06/24/2013   DOE (dyspnea on exertion) 06/24/2013    Orientation RESPIRATION BLADDER Height & Weight     Self, Time, Situation, Place  O2 Incontinent Weight: 54.4 kg Height:  5\' 3"  (160 cm)  BEHAVIORAL SYMPTOMS/MOOD NEUROLOGICAL BOWEL NUTRITION STATUS      Continent Diet (Heart Healthy)  AMBULATORY STATUS COMMUNICATION OF NEEDS Skin   Extensive Assist Verbally PU Stage and Appropriate Care   PU Stage 2 Dressing:  (Mepilex)                   Personal Care Assistance Level of Assistance  Bathing, Feeding, Dressing Bathing Assistance: Limited assistance Feeding assistance:  Independent Dressing Assistance: Limited assistance     Functional Limitations Info  Sight, Hearing, Speech Sight Info: Adequate Hearing Info: Adequate Speech Info: Adequate    SPECIAL CARE FACTORS FREQUENCY  PT (By licensed PT), OT (By licensed OT)     PT Frequency: 5 x weekly OT Frequency: 5 x weekly            Contractures Contractures Info: Not present    Additional Factors Info  Code Status, Allergies Code Status Info: Full Allergies Info: Codeine, Erythromycin           Current Medications (03/26/2021):  This is the current hospital active medication list Current Facility-Administered Medications  Medication Dose Route Frequency Provider Last Rate Last Admin   acetaminophen (TYLENOL) tablet 650 mg  650 mg Oral Q6H PRN 05/27/2021, MD   650 mg at 03/25/21 0946   apixaban (ELIQUIS) tablet 2.5 mg  2.5 mg Oral BID 03/27/21 L, MD   2.5 mg at 03/26/21 0835   atenolol (TENORMIN) tablet 25 mg  25 mg Oral Daily 05/27/21 L, MD   25 mg at 03/26/21 0835   benazepril (LOTENSIN) tablet 20 mg  20 mg Oral Daily 05/27/21, MD   20 mg at 03/26/21 0835   bisacodyl (DULCOLAX) suppository 10 mg  10 mg Rectal Daily PRN 05/27/21, MD   10 mg at 03/21/21 1727   chlorthalidone (HYGROTON) tablet 12.5-25 mg  12.5-25 mg Oral Daily PRN 10-26-1984, MD   25 mg at 03/24/21 0511   doxycycline (VIBRA-TABS) tablet 100  mg  100 mg Oral Q12H Pahwani, Daleen Bo, MD   100 mg at 03/26/21 0835   gabapentin (NEURONTIN) capsule 100 mg  100 mg Oral TID Hughie Closs, MD   100 mg at 03/25/21 2106   LORazepam (ATIVAN) tablet 0.5-1 mg  0.5-1 mg Oral BID PRN Hughie Closs, MD   0.5 mg at 03/25/21 2124   melatonin tablet 3 mg  3 mg Oral QHS Luiz Iron, NP   3 mg at 03/25/21 2106   oxyCODONE-acetaminophen (PERCOCET/ROXICET) 5-325 MG per tablet 1 tablet  1 tablet Oral Q6H PRN Hughie Closs, MD   1 tablet at 03/24/21 0418   pantoprazole (PROTONIX) EC tablet 40 mg  40 mg Oral Daily Earlie Lou L, MD   40 mg at 03/26/21 0835   polyethylene glycol (MIRALAX / GLYCOLAX) packet 17 g  17 g Oral Daily PRN Hughie Closs, MD   17 g at 03/22/21 2344   rosuvastatin (CRESTOR) tablet 10 mg  10 mg Oral Daily Hughie Closs, MD   10 mg at 03/26/21 3299     Discharge Medications: Please see discharge summary for a list of discharge medications.  Relevant Imaging Results:  Relevant Lab Results:   Additional Information Covid vaccinated x3  SS# [id number]  Jerrianne Hartin, Meriam Sprague, RN

## 2021-03-26 NOTE — Progress Notes (Addendum)
PROGRESS NOTE    Laurie Pugh  HYH:[id number] DOB: 1932-08-22 DOA: 03/20/2021 PCP: Crist Infante, MD   Brief Narrative:  Laurie Pugh is a 87 y.o. female with medical history significant of paroxysmal atrial fibrillation on Eliquis, coronary artery disease, GERD, esophageal stricture, essential hypertension, hyperlipidemia, PSVT, was brought in by her daughter due to observed confusion.  She complained of epigastric pain in the ER and shortness of breath.  Denied any fever or sick contact.  Patient was evaluated and found to have evidence of pneumonia.  She also met severe sepsis criteria based on tachycardia, hypoxia and leukocytosis. she is not on oxygen.Marland Kitchen She was COVID-19 negative.  She was admitted to the hospital with community-acquired pneumonia and metabolic encephalopathy.  She was started on Rocephin and doxycycline.  Did not improve much and required oxygen.  BNP was elevated.  Chest x-ray also suggested possible acute pulmonary edema.  She was started on Lasix 40 mg IV twice daily and had decent diuresis and she started feeling better.  Transthoracic echo showed normal ejection fraction and no wall motion would not be.  BNP was elevated so she had combination of pneumonia and acute on chronic congestive heart failure with preserved ejection fraction.  She was evaluated by PT OT who recommended home health initially however she was noted to have more weakness since yesterday so PT was called back again today and they have updated their recommendations to SNF now.  TOC consulted.   Assessment & Plan:   Principal Problem:   Pneumonia Active Problems:   Atrial fibrillation ([place])   Essential hypertension   Esophageal stricture   Chronic anticoagulation   Pressure injury of skin  Severe sepsis and acute hypoxic respiratory failure secondary to community-acquired pneumonia/possible aspiration pneumonia: Patient met severe sepsis criteria based on tachypnea, hypoxia and  leukocytosis.  She is still requiring 1.5 L of oxygen.  When seen this morning, PT OT and her daughter and patient's primary nurse were all at the bedside.  Patient was getting ready to be seen by PT OT.  She stated that she was feeling much better than yesterday.  She did not complain of dizziness that she had yesterday afternoon.  Looks very comfortable.  Lungs clear to auscultation.  Reportedly, she did not do well with the physical therapy, she became more hypoxic, tachycardic and PT has now updated their recommendation to discharge to SNF.  TOC is consulted.  Completed 5 days of IV antibiotics for pneumonia.  Acute pulmonary edema: Echo with normal ejection fraction.  Chest x-ray and lung exam improving.  She received IV Lasix 40 mg twice daily for last 2 days, due to elevated lactic acid yesterday, Lasix was discontinued starting today.  Chest x-ray repeated yesterday shows improved pulmonary edema and lungs are also clear to auscultation.  No further diuretics needed.  Encouraged incentive spirometry.  Acute metabolic encephalopathy: Secondary to pneumonia and hypoxia.  Resolved.  She is fully alert and oriented.    Hypokalemia: Resolved.   essential hypertension: Controlled.  Continue home medications which include atenolol, chlorthalidone and Lotensin.   atrial fibrillation: Rates are mostly controlled when she is at rest but her heart rate goes up to 120 symptoms 140 whenever she is doing some sort of exertion.  We will double dose of atenolol to 25 mg twice daily.  Continue Eliquis.   esophageal stricture: Continue PPIs   GERD: Continue PPI.   hyperlipidemia: Continue Crestor.   chronic pain syndrome: Has been on Percocet  at home.  Continue.  DVT prophylaxis: apixaban (ELIQUIS) tablet 2.5 mg Start: 03/21/21 0045   Code Status: Full Code  Family Communication: Daughter Santiago Glad present at bedside.   Status is: Inpatient  Remains inpatient appropriate because:Inpatient level of care  appropriate due to severity of illness  Dispo: The patient is from: Home              Anticipated d/c is to: SNF              Patient currently is not medically stable to d/c.   Difficult to place patient No        Estimated body mass index is 21.26 kg/m as calculated from the following:   Height as of this encounter: 5' 3"  (1.6 m).   Weight as of this encounter: 54.4 kg.  Pressure Injury 03/25/21 Sacrum Medial Stage 1 -  Intact skin with non-blanchable redness of a localized area usually over a bony prominence. (Active)  03/25/21 2130  Location: Sacrum  Location Orientation: Medial  Staging: Stage 1 -  Intact skin with non-blanchable redness of a localized area usually over a bony prominence.  Wound Description (Comments):   Present on Admission:      Nutritional status:               Consultants:  None  Procedures:  None  Antimicrobials:  Anti-infectives (From admission, onward)    Start     Dose/Rate Route Frequency Ordered Stop   03/22/21 1800  doxycycline (VIBRA-TABS) tablet 100 mg        100 mg Oral Every 12 hours 03/22/21 1447     03/21/21 1800  doxycycline (VIBRAMYCIN) 100 mg in sodium chloride 0.9 % 250 mL IVPB  Status:  Discontinued        100 mg 125 mL/hr over 120 Minutes Intravenous Every 12 hours 03/20/21 2224 03/22/21 1447   03/21/21 1700  cefTRIAXone (ROCEPHIN) 2 g in sodium chloride 0.9 % 100 mL IVPB        2 g 200 mL/hr over 30 Minutes Intravenous Every 24 hours 03/20/21 2224 03/25/21 1821   03/20/21 1715  cefTRIAXone (ROCEPHIN) 1 g in sodium chloride 0.9 % 100 mL IVPB        1 g 200 mL/hr over 30 Minutes Intravenous  Once 03/20/21 1701 03/20/21 1919   03/20/21 1715  azithromycin (ZITHROMAX) 500 mg in sodium chloride 0.9 % 250 mL IVPB        500 mg 250 mL/hr over 60 Minutes Intravenous  Once 03/20/21 1701 03/20/21 1919          Subjective: Seen and examined.  Daughter at the bedside.  Patient states that she is feeling better.   No shortness of breath or dizziness or any other complaint.  Objective: Vitals:   03/25/21 1742 03/25/21 2057 03/26/21 0436 03/26/21 0800  BP: (!) 122/101 117/84 121/86   Pulse: 83 98 88   Resp: 17 16 16    Temp: 97.8 F (36.6 C) 97.9 F (36.6 C) 97.7 F (36.5 C)   TempSrc: Oral Oral Oral   SpO2: 96% 96% 97% 96%  Weight:      Height:        Intake/Output Summary (Last 24 hours) at 03/26/2021 1250 Last data filed at 03/26/2021 0930 Gross per 24 hour  Intake 1140 ml  Output 200 ml  Net 940 ml    Filed Weights   03/20/21 1446  Weight: 54.4 kg    Examination:  General exam:  Appears calm and comfortable  Respiratory system: Clear to auscultation. Respiratory effort normal. Cardiovascular system: S1 & S2 heard, irregularly irregular rate and rhythm. No JVD, murmurs, rubs, gallops or clicks. No pedal edema. Gastrointestinal system: Abdomen is nondistended, soft and nontender. No organomegaly or masses felt. Normal bowel sounds heard. Central nervous system: Alert and oriented. No focal neurological deficits. Extremities: Symmetric 5 x 5 power. Skin: No rashes, lesions or ulcers.  Psychiatry: Judgement and insight appear normal. Mood & affect appropriate.     Data Reviewed: I have personally reviewed following labs and imaging studies  CBC: Recent Labs  Lab 03/20/21 1538 03/21/21 0024 03/23/21 0507 03/24/21 0538  WBC 15.3* 15.6* 10.2 8.7  NEUTROABS 13.5* 13.3* 8.2* 6.6  HGB 13.6 14.0 13.5 14.7  HCT 42.6 44.7 43.7 46.2*  MCV 95.1 97.2 97.8 95.9  PLT 149* 135* 149* 659    Basic Metabolic Panel: Recent Labs  Lab 03/23/21 0507 03/24/21 0538 03/25/21 0543 03/25/21 1651 03/26/21 0456  NA 139 139 136 135 135  K 3.2* 4.1 3.3* 5.1 4.9  CL 101 101 90* 91* 92*  CO2 27 26 33* 28 30  GLUCOSE 80 81 106* 116* 98  BUN 17 15 18 23  25*  CREATININE 0.64 0.58 0.70 0.91 0.82  CALCIUM 8.4* 9.2 9.7 10.1 9.7  MG  --   --   --   --  1.7    GFR: Estimated Creatinine  Clearance: 40.7 mL/min (by C-G formula based on SCr of 0.82 mg/dL). Liver Function Tests: Recent Labs  Lab 03/20/21 1538 03/21/21 0024  AST 308* 195*  ALT 103* 88*  ALKPHOS 84 86  BILITOT 3.5* 4.6*  PROT 6.3* 6.0*  ALBUMIN 3.6 3.3*    Recent Labs  Lab 03/20/21 1627  LIPASE 18    Recent Labs  Lab 03/20/21 1627  AMMONIA 23    Coagulation Profile: Recent Labs  Lab 03/20/21 1534  INR 1.4*    Cardiac Enzymes: No results for input(s): CKTOTAL, CKMB, CKMBINDEX, TROPONINI in the last 168 hours. BNP (last 3 results) No results for input(s): PROBNP in the last 8760 hours. HbA1C: No results for input(s): HGBA1C in the last 72 hours. CBG: Recent Labs  Lab 03/20/21 1600 03/25/21 1207  GLUCAP 144* 150*    Lipid Profile: No results for input(s): CHOL, HDL, LDLCALC, TRIG, CHOLHDL, LDLDIRECT in the last 72 hours. Thyroid Function Tests: No results for input(s): TSH, T4TOTAL, FREET4, T3FREE, THYROIDAB in the last 72 hours. Anemia Panel: No results for input(s): VITAMINB12, FOLATE, FERRITIN, TIBC, IRON, RETICCTPCT in the last 72 hours. Sepsis Labs: Recent Labs  Lab 03/20/21 1538 03/22/21 1106 03/25/21 1651 03/25/21 1936  PROCALCITON  --  1.06  --  0.14  LATICACIDVEN 1.5  --  2.4* 1.5     Recent Results (from the past 240 hour(s))  Urine culture     Status: Abnormal   Collection Time: 03/20/21  3:38 PM   Specimen: Urine, Random  Result Value Ref Range Status   Specimen Description   Final    URINE, RANDOM Performed at [facility] [address] [place], [place] [address]    Special Requests   Final    NONE Performed at [facility], [place] [address], [place], [place] [address]    Culture MULTIPLE SPECIES PRESENT, SUGGEST RECOLLECTION (A)  Final   Report Status 03/22/2021 FINAL  Final  Resp Panel by RT-PCR (Flu A&B, Covid) Nasopharyngeal Swab     Status: None  Collection Time: 03/20/21  3:38 PM   Specimen:  Nasopharyngeal Swab; Nasopharyngeal(NP) swabs in vial transport medium  Result Value Ref Range Status   SARS Coronavirus 2 by RT PCR NEGATIVE NEGATIVE Final    Comment: (NOTE) SARS-CoV-2 target nucleic acids are NOT DETECTED.  The SARS-CoV-2 RNA is generally detectable in upper respiratory specimens during the acute phase of infection. The lowest concentration of SARS-CoV-2 viral copies this assay can detect is 138 copies/mL. A negative result does not preclude SARS-Cov-2 infection and should not be used as the sole basis for treatment or other patient management decisions. A negative result may occur with  improper specimen collection/handling, submission of specimen other than nasopharyngeal swab, presence of viral mutation(s) within the areas targeted by this assay, and inadequate number of viral copies(<138 copies/mL). A negative result must be combined with clinical observations, patient history, and epidemiological information. The expected result is Negative.  Fact Sheet for Patients:  EntrepreneurPulse.com.au  Fact Sheet for Healthcare Providers:  IncredibleEmployment.be  This test is no t yet approved or cleared by the [place] FDA and  has been authorized for detection and/or diagnosis of SARS-CoV-2 by FDA under an Emergency Use Authorization (EUA). This EUA will remain  in effect (meaning this test can be used) for the duration of the COVID-19 declaration under Section 564(b)(1) of the Act, 21 U.S.C.section 360bbb-3(b)(1), unless the authorization is terminated  or revoked sooner.       Influenza A by PCR NEGATIVE NEGATIVE Final   Influenza B by PCR NEGATIVE NEGATIVE Final    Comment: (NOTE) The Xpert Xpress SARS-CoV-2/FLU/RSV plus assay is intended as an aid in the diagnosis of influenza from Nasopharyngeal swab specimens and should not be used as a sole basis for treatment. Nasal washings and aspirates are unacceptable for  Xpert Xpress SARS-CoV-2/FLU/RSV testing.  Fact Sheet for Patients: EntrepreneurPulse.com.au  Fact Sheet for Healthcare Providers: IncredibleEmployment.be  This test is not yet approved or cleared by the [place] FDA and has been authorized for detection and/or diagnosis of SARS-CoV-2 by FDA under an Emergency Use Authorization (EUA). This EUA will remain in effect (meaning this test can be used) for the duration of the COVID-19 declaration under Section 564(b)(1) of the Act, 21 U.S.C. section 360bbb-3(b)(1), unless the authorization is terminated or revoked.  Performed at [facility], [place] [address], [place], [place] [address]   Culture, blood (routine x 2) Call MD if unable to obtain prior to antibiotics being given     Status: None   Collection Time: 03/21/21 12:24 AM   Specimen: BLOOD  Result Value Ref Range Status   Specimen Description   Final    BLOOD RIGHT ANTECUBITAL Performed at [facility] [address] [place], [place] [address]    Special Requests   Final    BOTTLES DRAWN AEROBIC ONLY Blood Culture adequate volume Performed at [facility] [address] [place], [place] [address]    Culture   Final    NO GROWTH 5 DAYS Performed at [facility], [place] [address] [place], [place] [address]    Report Status 03/26/2021 FINAL  Final  Culture, blood (routine x 2) Call MD if unable to obtain prior to antibiotics being given     Status: Abnormal   Collection Time: 03/21/21 12:24 AM   Specimen: BLOOD  Result Value Ref Range Status   Specimen Description   Final    BLOOD LEFT HAND Performed at [facility]  [facility], [place] [address] [place], [place] [address]    Special Requests   Final    BOTTLES DRAWN AEROBIC ONLY Blood Culture adequate volume Performed at [facility] [address], [place], [place] [address]    Culture   Setup Time (A)  Final    GRAM VARIABLE ROD AEROBIC BOTTLE ONLY CRITICAL RESULT CALLED TO, READ BACK BY AND VERIFIED WITH: E,WILLIAMSON PHARMD @1946  03/21/21 EB    Culture (A)  Final    BACILLUS SPECIES Standardized susceptibility testing for this organism is not available. Performed at  [facility], [place] [address] [place], [place] [address]    Report Status 03/22/2021 FINAL  Final  Blood Culture ID Panel (Reflexed)     Status: None   Collection Time: 03/21/21 12:24 AM  Result Value Ref Range Status   Enterococcus faecalis NOT DETECTED NOT DETECTED Final   Enterococcus Faecium NOT DETECTED NOT DETECTED Final   Listeria monocytogenes NOT DETECTED NOT DETECTED Final   Staphylococcus species NOT DETECTED NOT DETECTED Final   Staphylococcus aureus (BCID) NOT DETECTED NOT DETECTED Final   Staphylococcus epidermidis NOT DETECTED NOT DETECTED Final   Staphylococcus lugdunensis NOT DETECTED NOT DETECTED Final   Streptococcus species NOT DETECTED NOT DETECTED Final   Streptococcus agalactiae NOT DETECTED NOT DETECTED Final   Streptococcus pneumoniae NOT DETECTED NOT DETECTED Final   Streptococcus pyogenes NOT DETECTED NOT DETECTED Final   A.calcoaceticus-baumannii NOT DETECTED NOT DETECTED Final   Bacteroides fragilis NOT DETECTED NOT DETECTED Final   Enterobacterales NOT DETECTED NOT DETECTED Final   Enterobacter cloacae complex NOT DETECTED NOT DETECTED Final   Escherichia coli NOT DETECTED NOT DETECTED Final   Klebsiella aerogenes NOT DETECTED NOT DETECTED Final   Klebsiella oxytoca NOT DETECTED NOT DETECTED Final   Klebsiella pneumoniae NOT DETECTED NOT DETECTED Final   Proteus species NOT DETECTED NOT DETECTED Final   Salmonella species NOT DETECTED NOT DETECTED Final   Serratia marcescens NOT DETECTED NOT DETECTED Final   Haemophilus influenzae NOT DETECTED NOT DETECTED Final   Neisseria meningitidis NOT DETECTED NOT DETECTED Final   Pseudomonas aeruginosa NOT DETECTED NOT  DETECTED Final   Stenotrophomonas maltophilia NOT DETECTED NOT DETECTED Final   Candida albicans NOT DETECTED NOT DETECTED Final   Candida auris NOT DETECTED NOT DETECTED Final   Candida glabrata NOT DETECTED NOT DETECTED Final   Candida krusei NOT DETECTED NOT DETECTED Final   Candida parapsilosis NOT DETECTED NOT DETECTED Final   Candida tropicalis NOT DETECTED NOT DETECTED Final   Cryptococcus neoformans/gattii NOT DETECTED NOT DETECTED Final    Comment: Performed at [facility], [place] [address], [place], [place] [address]       Radiology Studies: DG CHEST PORT 1 VIEW  Result Date: 03/25/2021 CLINICAL DATA:  87 year old female with hypoxia. EXAM: PORTABLE CHEST 1 VIEW COMPARISON:  Chest radiograph dated 06/26/2004. FINDINGS: Background of emphysema. No focal consolidation, pleural effusion, pneumothorax. Mild cardiomegaly. Atherosclerotic calcification of the aorta. No acute osseous pathology. IMPRESSION: No active disease. Electronically Signed   By: Anner Crete M.D.   On: 03/25/2021 19:30    Scheduled Meds:  apixaban  2.5 mg Oral BID   atenolol  25 mg Oral Daily   benazepril  20 mg Oral Daily   doxycycline  100 mg Oral Q12H   gabapentin  100 mg Oral TID   melatonin  3 mg Oral QHS   pantoprazole  40 mg Oral Daily   rosuvastatin  10 mg Oral  Daily   Continuous Infusions:     LOS: 6 days   Time spent: 29 minutes   Darliss Cheney, MD [facility]  03/26/2021, 12:50 PM   How to contact the [facility] Attending or Consulting provider [place] or covering provider during after hours [place], for this patient?  Check the care team in [facility] and look for a) attending/consulting TRH provider listed and b) the [facility] team listed. Page or secure chat [phone]. Log into [email or web address] and use [place]'s universal password to access. If you do not have the password, please contact the hospital operator. Locate the [facility] provider you are looking for under [facility] and page to  a number that you can be directly reached. If you still have difficulty reaching the provider, please page the [facility] (Director on Call) for the Hospitalists listed on amion for assistance.

## 2021-03-26 NOTE — Progress Notes (Signed)
Chaplain engaged in an initial visit with Laurie Pugh and her children.  Laurie Pugh shared that she has been feeling really weak and hasn't had an appetite.  She noted how hard it has been to get up and when she tried to walk she just felt really weak.  Laurie Pugh's daughter is also in the process of figuring out what rehab facility Laurie Pugh will be going too.  Chaplain offered prayer over Laurie Pugh's weakness and the process of finding the right facility.  Chaplain offered presence, listening, and support.    03/26/21 1400  Clinical Encounter Type  Visited With Patient and family together  Visit Type Initial

## 2021-03-26 NOTE — TOC Progression Note (Addendum)
Transition of Care [facility]) - Progression Note    Patient Details  Name: Laurie Pugh MRN: [id number] Date of Birth: [birth year]  [facility] CM/SW Contact  Kynzlie Hilleary, Meriam Sprague, RN Phone Number: 03/26/2021, 2:56 PM  Clinical Narrative:    New physical therapy recommendations are for SNF. Spoke with pt at bedside and daughter Laurie Pugh who now wish for pt to go to SNF. Permission received to fax out FL2 to area SNFs.  SNF bed offers presented to daughter Laurie Pugh who chooses [facility]. [facility] liaison alerted of bed acceptance for tomorrow. Insurance auth started with [facility] ref # [id number]. TOC will continue to follow.   Expected Discharge Plan: Home/Self Care Barriers to Discharge: Continued Medical Work up  Expected Discharge Plan and Services Expected Discharge Plan: Home/Self Care   Discharge Planning Services: CM Consult Post Acute Care Choice: Home Health Living arrangements for the past 2 months: Mobile Home Expected Discharge Date: 03/26/21                     Readmission Risk Interventions No flowsheet data found.

## 2021-03-26 NOTE — Progress Notes (Addendum)
Physical Therapy Treatment Patient Details Name: Laurie Pugh MRN: [id number] DOB: 1931/10/13 Today's Date: 03/26/2021    History of Present Illness Pt admitted from home with AMS and dx with severe sepsis 2* PNA.  Pt with hx of a-fib, CAD, and lumbar vertebroplasty in 2020.    PT Comments    Pt in bed on 1 lt sats 94% and HR A Fib range at rest 109.  Daughter present during session Pt is from home alone and was amb without any AD.   Assisted OOB to amb to bathroom was difficult.  General bed mobility comments: physically self able to come to EOB with increased time and effort. General transfer comment: very unsteady with def use of hands to self asssit.  from elevated surface pt only required [facility] Assist however had an uncontrolled stand to sit onto regular height toilet which Therapist had to recover.  Then pt was unable to self rise from regular height toilet due to weakness.  Required Mod Assist to stand.  Very unsteady balance during self peri care and donning/doffing underpants.  Present with 3 Total loss of balance in bathroom with turns and back steps in which Therapist recovered.  Poor reaction responce and poor self corrdection. General Gait Details: Very unsteady gait without using any AD as this is her baseline.  Pt does admit to "furniture walking" in her home.  Had 2 Total Loss of balance while amb 12 feet to bathroom in which Therapist recovered.  Poor self correction and poor responce reaction time.  HIGH FALL RISK.  We did use a walker to amb in hallway which pt tolerated an increased distance but still limited by c/o B LE weakness and general fatigue.  Also RA decreased to 87% and A Fib HR increased to high range of 134 - 165.  Recliner brought to pt to cease activity after 18 feet. Reapplied nasal cannula.  Will consult LPT and update on current mobility level.  Pt too unsteady to return home alone and family unable to provided 24/7 care as they work. Pt will need ST Rehab  at SNF. Message to MD via secure chat: General Gait Details: Very unsteady gait without using any AD as this is her baseline.  Pt does admit to "furniture walking" in her home.  Had 2 Total Loss of balance while amb 12 feet to bathroom in which Therapist recovered.  Poor self correction and poor responce reaction time.  HIGH FALL RISK.  We did use a walker to amb in hallway which pt tolerated an increased distance but still limited by c/o B LE weakness and general fatigue.  Also RA decreased to 87% and A Fib HR increased to high range of 134 - 165.  Recliner brought to pt to cease activity after 18 feet.   Follow Up Recommendations  SNF     Equipment Recommendations  Rolling walker with 5" wheels    Recommendations for Other Services       Precautions / Restrictions Precautions Precautions: Fall Precaution Comments: monitor RA and HR    Mobility  Bed Mobility Overal bed mobility: Needs Assistance Bed Mobility: Supine to Sit     Supine to sit: Supervision     General bed mobility comments: physically self able to come to EOB with increased time and effort.    Transfers Overall transfer level: Needs assistance Equipment used: None Transfers: Sit to/from [employer] Sit to Stand: Min guard;Min assist;Mod assist Stand pivot transfers: Min assist;Mod assist;Max  assist       General transfer comment: very unsteady with def use of hands to self asssit.  from elevated surface pt only required [facility] Assist however had an uncontrolled stand to sit onto regular height toilet which Therapist had to recover.  Then pt was unable to self rise from regular height toilet due to weakness.  Required Mod Assist to stand.  Very unsteady balance during self peri care and donning/doffing underpants.  Present with 3 Total loss of balance in bathroom with turns and back steps in which Therapist recovered.  Poor reaction responce and poor self  corrdection.  Ambulation/Gait Ambulation/Gait assistance: Min assist Gait Distance (Feet): 40 Feet Assistive device: Rolling walker (2 wheeled) Gait Pattern/deviations: Step-to pattern;Step-through pattern;Drifts right/left;Trunk flexed Gait velocity: decreased   General Gait Details: Very unsteady gait without using any AD as this is her baseline.  Pt does admit to "furniture walking" in her home.  Had 2 Total Loss of balance while amb 12 feet to bathroom in which Therapist recovered.  Poor self correction and poor responce reaction time.  HIGH FALL RISK.  We did use a walker to amb in hallway which pt tolerated an increased distance but still limited by c/o B LE weakness and general fatigue.  Also RA decreased to 87% and A Fib HR increased to high range of 134 - 165.  Recliner brought to pt to cease activity after 18 feet.   Stairs             Wheelchair Mobility    Modified Rankin (Stroke Patients Only)       Balance                                            Cognition Arousal/Alertness: Awake/alert Behavior During Therapy: WFL for tasks assessed/performed Overall Cognitive Status: Within Functional Limits for tasks assessed                                 General Comments: AxO x 2 pleasant following all directions but has poor insight to her current medical and physical abilities      Exercises      General Comments        Pertinent Vitals/Pain Pain Assessment: No/denies pain    Home Living                      Prior Function            PT Goals (current goals can now be found in the care plan section)      Frequency    Min 3X/week      PT Plan Current plan remains appropriate    Co-evaluation              AM-PAC PT "6 Clicks" Mobility   Outcome Measure  Help needed turning from your back to your side while in a flat bed without using bedrails?: A Little Help needed moving from lying on your  back to sitting on the side of a flat bed without using bedrails?: A Little Help needed moving to and from a bed to a chair (including a wheelchair)?: A Little Help needed standing up from a chair using your arms (e.g., wheelchair or bedside chair)?: A Little Help needed to walk in hospital  room?: A Lot Help needed climbing 3-5 steps with a railing? : A Lot 6 Click Score: 16    End of Session Equipment Utilized During Treatment: Gait belt Activity Tolerance: Patient tolerated treatment well Patient left: in chair;with call bell/phone within reach;with chair alarm set Nurse Communication: Mobility status PT Visit Diagnosis: Muscle weakness (generalized) (M62.81);Difficulty in walking, not elsewhere classified (R26.2)     Time: 1010-1035 PT Time Calculation (min) (ACUTE ONLY): 25 min  Charges:  $Gait Training: 8-22 mins $Therapeutic Activity: 8-22 mins                     Felecia Shelling  PTA Acute  Rehabilitation Services Pager      [phone] Office      [phone]

## 2021-03-26 NOTE — Plan of Care (Signed)
Pt was able to sleep comfortably; she had 3 episodes of diarrhea, very small amount; no s/s of acute distress or pain observed or reported; c/o blurry vision; call light within reach and bed at lowest position for safety. Sacral pressure ulcer stage 2, barrier cream and mepilex applied. Pt does not report pain at site.

## 2021-03-26 NOTE — Progress Notes (Addendum)
Notified Hughie Closs MD earlier this morning that patient was still having some weakness with her legs, blurred vision and unsteady with mobility. I told the MD that I felt as if she wasn't safe to return home at this point in time. Also that patient was still in Afib. PT came to evaluate patient. While working with PT her balance was still very unsteady 02 sats dropped to 87% on room air. Patient HR elevated to 140s-160's. PT got patient back to chair and in her room. Now sitting up in chair in room wearing 1 LPM of 02. No complaints. PT recommending SNF. MD made aware of all of the above.

## 2021-03-27 LAB — SARS CORONAVIRUS 2 (TAT 6-24 HRS): SARS Coronavirus 2: NEGATIVE

## 2021-03-27 MED ORDER — POLYVINYL ALCOHOL 1.4 % OP SOLN
1.0000 [drp] | OPHTHALMIC | Status: DC | PRN
  Administered 2021-03-27: 1 [drp] via OPHTHALMIC
  Filled 2021-03-27: qty 15

## 2021-03-27 NOTE — Progress Notes (Signed)
SATURATION QUALIFICATIONS: (This note is used to comply with regulatory documentation for home oxygen)  Patient Saturations on Room Air at Rest = 85%  Patient Saturations on Room Air while Ambulating = 72%  Patient Saturations on  2 Liters of oxygen while Ambulating = 90%  Please briefly explain why patient needs home oxygen: Patients oxygen level drops to 72% with out oxygen while ambulating.

## 2021-03-27 NOTE — Progress Notes (Signed)
PROGRESS NOTE  Laurie Pugh YKD:[id number] DOB: [birth year] DOA: 03/20/2021 PCP: Crist Infante, MD  Brief History   Laurie Pugh is a 87 y.o. female with medical history significant of paroxysmal atrial fibrillation on Eliquis, coronary artery disease, GERD, esophageal stricture, essential hypertension, hyperlipidemia, PSVT, was brought in by her daughter due to observed confusion.  She complained of epigastric pain in the ER and shortness of breath.  Denied any fever or sick contact.  Patient was evaluated and found to have evidence of pneumonia.  She also met severe sepsis criteria based on tachycardia, hypoxia and leukocytosis. she is not on oxygen.Marland Kitchen She was COVID-19 negative.  She was admitted to the hospital with community-acquired pneumonia and metabolic encephalopathy.  She was started on Rocephin and doxycycline.  Did not improve much and required oxygen.  BNP was elevated.  Chest x-ray also suggested possible acute pulmonary edema.  She was started on Lasix 40 mg IV twice daily and had decent diuresis and she started feeling better.  Transthoracic echo showed normal ejection fraction and no wall motion would not be.  BNP was elevated so she had combination of pneumonia and acute on chronic congestive heart failure with preserved ejection fraction.  She was evaluated by PT OT who recommended home health initially however she was noted to have more weakness since yesterday so PT was called back again today and they have updated their recommendations to SNF now.  TOC consulted.  The patient is awaiting authorization to go to [facility].  Consultants  None  Procedures  None  Antibiotics   Anti-infectives (From admission, onward)    Start     Dose/Rate Route Frequency Ordered Stop   03/22/21 1800  doxycycline (VIBRA-TABS) tablet 100 mg        100 mg Oral Every 12 hours 03/22/21 1447     03/21/21 1800  doxycycline (VIBRAMYCIN) 100 mg in sodium chloride 0.9 % 250 mL IVPB  Status:   Discontinued        100 mg 125 mL/hr over 120 Minutes Intravenous Every 12 hours 03/20/21 2224 03/22/21 1447   03/21/21 1700  cefTRIAXone (ROCEPHIN) 2 g in sodium chloride 0.9 % 100 mL IVPB        2 g 200 mL/hr over 30 Minutes Intravenous Every 24 hours 03/20/21 2224 03/25/21 1821   03/20/21 1715  cefTRIAXone (ROCEPHIN) 1 g in sodium chloride 0.9 % 100 mL IVPB        1 g 200 mL/hr over 30 Minutes Intravenous  Once 03/20/21 1701 03/20/21 1919   03/20/21 1715  azithromycin (ZITHROMAX) 500 mg in sodium chloride 0.9 % 250 mL IVPB        500 mg 250 mL/hr over 60 Minutes Intravenous  Once 03/20/21 1701 03/20/21 1919        Subjective  The patient is resting comfortably. She is saturating 97% on 2L at rest. With ambulation she saturates 90% on room air. We will continue to attempt to wean O2. She does not use O2 at home.  Objective   Vitals:  Vitals:   03/27/21 0559 03/27/21 1420  BP: 137/88 121/78  Pulse: 79 85  Resp: 20 15  Temp: (!) 97.5 F (36.4 C) (!) 97.4 F (36.3 C)  SpO2: 95% 97%   Exam:  Constitutional:  The patient is awake, alert, and oriented x 3. No acute distress. Respiratory:  No increased work of breathing. No wheezes, rales, or rhonchi No tactile fremitus Cardiovascular:  Regular rate and rhythm No  murmurs, ectopy, or gallups. No lateral PMI. No thrills. Abdomen:  Abdomen is soft, non-tender, non-distended No hernias, masses, or organomegaly Normoactive bowel sounds.  Musculoskeletal:  No cyanosis, clubbing, or edema Skin:  No rashes, lesions, ulcers palpation of skin: no induration or nodules Neurologic:  CN 2-12 intact Sensation all 4 extremities intact Psychiatric:  Mental status Mood, affect appropriate Orientation to person, place, time  judgment and insight appear intact   I have personally reviewed the following:   Today's Data   Vitals:   03/27/21 0559 03/27/21 1420  BP: 137/88 121/78  Pulse: 79 85  Resp: 20 15  Temp: (!) 97.5  F (36.4 C) (!) 97.4 F (36.3 C)  SpO2: 95% 97%     Lab Data  CBC    Component Value Date/Time   WBC 8.7 03/24/2021 0538   RBC 4.82 03/24/2021 0538   HGB 14.7 03/24/2021 0538   HCT 46.2 (H) 03/24/2021 0538   PLT 185 03/24/2021 0538   MCV 95.9 03/24/2021 0538   MCH 30.5 03/24/2021 0538   MCHC 31.8 03/24/2021 0538   RDW 15.0 03/24/2021 0538   LYMPHSABS 1.2 03/24/2021 0538   MONOABS 0.7 03/24/2021 0538   EOSABS 0.1 03/24/2021 0538   BASOSABS 0.1 03/24/2021 0538   BMP Latest Ref Rng & Units 03/26/2021 03/25/2021 03/25/2021  Glucose 70 - 99 mg/dL 98 116(H) 106(H)  BUN 8 - 23 mg/dL 25(H) 23 18  Creatinine 0.44 - 1.00 mg/dL 0.82 0.91 0.70  Sodium 135 - 145 mmol/L 135 135 136  Potassium 3.5 - 5.1 mmol/L 4.9 5.1 3.3(L)  Chloride 98 - 111 mmol/L 92(L) 91(L) 90(L)  CO2 22 - 32 mmol/L 30 28 33(H)  Calcium 8.9 - 10.3 mg/dL 9.7 10.1 9.7    Scheduled Meds:  apixaban  2.5 mg Oral BID   atenolol  25 mg Oral BID   benazepril  20 mg Oral Daily   doxycycline  100 mg Oral Q12H   gabapentin  100 mg Oral TID   melatonin  3 mg Oral QHS   pantoprazole  40 mg Oral Daily   rosuvastatin  10 mg Oral Daily   Continuous Infusions:  Principal Problem:   Pneumonia Active Problems:   Atrial fibrillation (HCC)   Essential hypertension   Esophageal stricture   Chronic anticoagulation   Pressure injury of skin   LOS: 7 days   A & P  Severe sepsis and acute hypoxic respiratory failure secondary to community-acquired pneumonia/possible aspiration pneumonia: Patient met severe sepsis criteria based on tachypnea, hypoxia and leukocytosis.  She is still requiring 1.5 L of oxygen.  When seen this morning, PT OT and her daughter and patient's primary nurse were all at the bedside.  Patient was getting ready to be seen by PT OT.  She stated that she was feeling much better than yesterday.  She did not complain of dizziness that she had yesterday afternoon.  Looks very comfortable.  Lungs clear to  auscultation.  Reportedly, she did not do well with the physical therapy, she became more hypoxic, tachycardic and PT has now updated their recommendation to discharge to SNF.  TOC is consulted.  Completed 5 days of IV antibiotics for pneumonia.   Acute pulmonary edema: Echo with normal ejection fraction.  Chest x-ray and lung exam improving.  She received IV Lasix 40 mg twice daily for last 2 days, due to elevated lactic acid yesterday, Lasix was discontinued starting today.  Chest x-ray repeated yesterday shows improved pulmonary edema and  lungs are also clear to auscultation.  No further diuretics needed.  Encouraged incentive spirometry.   Acute metabolic encephalopathy: Secondary to pneumonia and hypoxia.  Resolved.  She is fully alert and oriented.     Hypokalemia: Resolved.   essential hypertension: Controlled.  Continue home medications which include atenolol, chlorthalidone and Lotensin.   atrial fibrillation: Rates are mostly controlled when she is at rest but her heart rate goes up to 120 symptoms 140 whenever she is doing some sort of exertion.  We will double dose of atenolol to 25 mg twice daily.  Continue Eliquis.   esophageal stricture: Continue PPIs   GERD: Continue PPI.   hyperlipidemia: Continue Crestor.   chronic pain syndrome: Has been on Percocet at home.  Continue.   DVT prophylaxis: apixaban (ELIQUIS) tablet 2.5 mg Start: 03/21/21 0045   Code Status: Full Code  Family Communication: Daughter Laurie Pugh present at bedside.   Status is: Inpatient   Remains inpatient appropriate because:Inpatient level of care appropriate due to severity of illness   Dispo: The patient is from: Home              Anticipated d/c is to: SNF              Patient currently is not medically stable to d/c.              Difficult to place patient No   Kongmeng Santoro, DO [facility] Direct contact: see [email or web address]  7PM-7AM contact night coverage as above 03/27/2021, 6:56 PM  LOS: 7  days

## 2021-03-27 NOTE — Evaluation (Signed)
Clinical/Bedside Swallow Evaluation Patient Details  Name: Laurie Pugh MRN: [id number] Date of Birth: [birth year]  Today's Date: 03/27/2021 Time: SLP Start Time (ACUTE ONLY): 1320 SLP Stop Time (ACUTE ONLY): 1345 SLP Time Calculation (min) (ACUTE ONLY): 25 min  Past Medical History:  Past Medical History:  Diagnosis Date   Allergy    Angina pectoris, unstable (HCC)    Atrial fibrillation (HCC)    Cataract    Dyslipidemia    Dysrhythmia    Esophageal stricture    GERD (gastroesophageal reflux disease)    Hyperlipidemia    Hypertension    Neck pain    PAF (paroxysmal atrial fibrillation) (HCC)    PSVT (paroxysmal supraventricular tachycardia) (HCC)    Past Surgical History:  Past Surgical History:  Procedure Laterality Date   ABDOMINAL HYSTERECTOMY     APPENDECTOMY     BLADDER SURGERY     bladder surgery   BREAST EXCISIONAL BIOPSY     CARDIAC CATHETERIZATION  06/28/2004   no significant CAD (Dr. Laurell Josephs)   CATARACT EXTRACTION, BILATERAL  2008   CYSTOSCOPY W/ RETROGRADES Bilateral 04/11/2016   Procedure: CYSTOSCOPY WITH BILATERAL RETROGRADE PYELOGRAM;  Surgeon: Hildred Laser, MD;  Location: [facility];  Service: Urology;  Laterality: Bilateral;   ESOPHAGOGASTRODUODENOSCOPY N/A 01/30/2017   Procedure: ESOPHAGOGASTRODUODENOSCOPY (EGD);  Surgeon: Hilarie Fredrickson, MD;  Location: [facility];  Service: Endoscopy;  Laterality: N/A;   ESOPHAGOGASTRODUODENOSCOPY N/A 01/30/2017   Procedure: ESOPHAGOGASTRODUODENOSCOPY (EGD);  Surgeon: Napoleon Form, MD;  Location: [facility];  Service: Endoscopy;  Laterality: N/A;   IR VERTEBROPLASTY LUMBAR BX INC UNI/BIL INC/INJECT/IMAGING  03/15/2019   TRANSTHORACIC ECHOCARDIOGRAM  2009   borderline conc LVH; trace MR; mild-mod TR, RVSP 30-25mmHg   HPI:  87 y.o. female with medical history significant of paroxysmal atrial fibrillation, coronary artery disease, GERD, esophageal stricture, essential hypertension, hyperlipidemia, PSVT, who is  currently on Eliquis who was brought in by her daughter who noted that she was confused this morning.  She complained of epigastric pain in the ER and shortness of breath.  Denied any fever or sick contact.  Patient was evaluated and found to have evidence of pneumonia.  She is not on oxygen.Marland Kitchen  She was COVID-19 negative but chest x-ray confirming pneumonia so patient being admitted to the hospital with community-acquired pneumonia and metabolic encephalopathy as a result.03/25/21 CXR indicated Background of emphysema. No focal consolidation, pleural effusion,  pneumothorax. Mild cardiomegaly. Atherosclerotic calcification of  the aorta. No acute osseous pathology BSE generated.  Assessment / Plan / Recommendation Clinical Impression  Pt seen for a clinical swallowing evaluation with various consistencies with a normal oropharyngeal swallow noted despite decreased respiratory status/recent pneumonia.  Her SOB is primarily on exertion and O2 levels ranged from 89-95 throughout BSE without overt s/s of aspiration present.  Education provided re: breathing/swallowing reciprocity if SOB impacts swallowing in future.  Recommend continuing current diet of regular/thin liquids with general swallowing precautions in place.  ST will s/o at this time.  Thank you for this consult. SLP Visit Diagnosis: Dysphagia, unspecified (R13.10)    Aspiration Risk  Mild aspiration risk    Diet Recommendation   Regular/thin liquids  Medication Administration: Whole meds with liquid    Other  Recommendations Oral Care Recommendations: Oral care BID   Follow up Recommendations Skilled Nursing facility      Frequency and Duration  (Evaluation only)          Prognosis Prognosis for Safe Diet Advancement: Good  Swallow Study   General Date of Onset: 03/20/21 HPI: 87 y.o. female with medical history significant of paroxysmal atrial fibrillation, coronary artery disease, GERD, esophageal stricture, essential  hypertension, hyperlipidemia, PSVT, who is currently on Eliquis who was brought in by her daughter who noted that she was confused this morning.  She complained of epigastric pain in the ER and shortness of breath.  Denied any fever or sick contact.  Patient was evaluated and found to have evidence of pneumonia.  She is not on oxygen.Marland Kitchen  She was COVID-19 negative but chest x-ray confirming pneumonia so patient being admitted to the hospital with community-acquired pneumonia and metabolic encephalopathy as a result.03/25/21 CXR indicated Background of emphysema. No focal consolidation, pleural effusion,  pneumothorax. Mild cardiomegaly. Atherosclerotic calcification of  the aorta. No acute osseous pathology Type of Study: Bedside Swallow Evaluation Previous Swallow Assessment: n/a Diet Prior to this Study: Regular;Thin liquids Temperature Spikes Noted: No Respiratory Status: Nasal cannula History of Recent Intubation: No Behavior/Cognition: Alert;Cooperative;Pleasant mood Oral Cavity Assessment: Within Functional Limits Oral Care Completed by SLP: No (Pt eating when SLP arrived) Oral Cavity - Dentition: Adequate natural dentition;Missing dentition Vision: Functional for self-feeding Self-Feeding Abilities: Able to feed self Patient Positioning: Upright in bed Baseline Vocal Quality: Normal Volitional Cough: Strong Volitional Swallow: Able to elicit    Oral/Motor/Sensory Function Overall Oral Motor/Sensory Function: Within functional limits   Ice Chips Ice chips: Within functional limits Presentation: Spoon   Thin Liquid Thin Liquid: Within functional limits Presentation: Cup;Straw    Nectar Thick Nectar Thick Liquid: Not tested   Honey Thick Honey Thick Liquid: Not tested   Puree Puree: Within functional limits Presentation: Self Fed   Solid     Solid: Within functional limits Presentation: Self Fed      Tressie Stalker, M.S., CCC-SLP 03/27/2021,4:07 PM

## 2021-03-28 DIAGNOSIS — L89301 Pressure ulcer of unspecified buttock, stage 1: Secondary | ICD-10-CM

## 2021-03-28 DIAGNOSIS — Z7901 Long term (current) use of anticoagulants: Secondary | ICD-10-CM

## 2021-03-28 MED ORDER — DOXYCYCLINE HYCLATE 100 MG PO TABS: 100.0000 mg | ORAL_TABLET | Freq: Two times a day (BID) | ORAL | 0 refills | Status: AC

## 2021-03-28 MED ORDER — ATENOLOL 25 MG PO TABS: 25.0000 mg | ORAL_TABLET | Freq: Two times a day (BID) | ORAL | 0 refills | Status: DC

## 2021-03-28 MED ORDER — POLYVINYL ALCOHOL 1.4 % OP SOLN: 1.0000 [drp] | OPHTHALMIC | 0 refills | Status: AC | PRN

## 2021-03-28 NOTE — TOC Transition Note (Signed)
CSW contacted [facility] to inquire if patient could discharge to facility. Admissions agreeable to take patient. CSW faxed discharge summary in hub. Nurse agreeable to call report and complete med necessity. CSW contacted EMS. TOC signing off.

## 2021-03-28 NOTE — Progress Notes (Signed)
Occupational Therapy Treatment Patient Details Name: Laurie Pugh MRN: [id number] DOB: 28-Oct-1931 Today's Date: 03/28/2021    History of present illness Pt admitted from home with AMS and dx with severe sepsis 2* PNA.  Pt with hx of a-fib, CAD, and lumbar vertebroplasty in 2020.   OT comments  Treatment focused on self care tasks. Increased time for all tasks to monitor o2 sats. Patient found on 1 L [place]. [place] removed at side of bed. O2 sat maintaining at 95-96% on right hand and 87-89% with poor pleth signal on left continuous pulse ox. Patient ambulated to bathroom and perform toileting and standing at sink for grooming. No signs of shortness of breath. O2 at 94-96% on right hand. After oral care patient's hand wet and became colder resulting in difficulty assessing o2 sats. Multiple attempts - with new probe and attempt at ear lobe did not get good pleth signal due to cold fingers. O2 sats reading 87-89% which therapist found questionable. Eventually patient reading 91% and therapist left patient on RA and informed RN. Patient overall reports weak and concerned about needing o2. Patient is unable to manage house hold tasks and is too weak to return home alone therefore therapist recommends short term rehab at discharge. If patient had 24/7 assistance at home she would be able to return with [facility] as is her preference. Patient's daughter reports she is recovering from a shoulder surgery and is unable to help her.    Follow Up Recommendations  Home health OT;SNF    Equipment Recommendations  Tub/shower seat    Recommendations for Other Services      Precautions / Restrictions Precautions Precautions: Fall Precaution Comments: monitor RA and HR Restrictions Weight Bearing Restrictions: No       Mobility Bed Mobility Overal bed mobility: Needs Assistance Bed Mobility: Sit to Supine     Supine to sit: Supervision;HOB elevated     General bed mobility comments: increased time to  transfer to edge of bed.    Transfers Overall transfer level: Needs assistance Equipment used: Rolling walker (2 wheeled) Transfers: Sit to/from [employer] Sit to Stand: Min guard         General transfer comment: MIn guard to stand and ambulate in room. Use of RW for safety.    Balance Overall balance assessment: Mild deficits observed, not formally tested                                         ADL either performed or assessed with clinical judgement   ADL Overall ADL's : Needs assistance/impaired     Grooming: Standing;Min guard;Oral care Grooming Details (indicate cue type and reason): min guard for oral care standing at sink.                 Toilet Transfer: Insurance underwriter and Hygiene: Minimal assistance;Sit to/from stand Toileting - Clothing Manipulation Details (indicate cue type and reason): for clothing management.     Functional mobility during ADLs: Min guard;Rolling walker;Cueing for safety       Vision Patient Visual Report: No change from baseline     Perception     Praxis      Cognition Arousal/Alertness: Awake/alert Behavior During Therapy: WFL for tasks assessed/performed Overall Cognitive Status: Within Functional Limits for tasks assessed  Exercises     Shoulder Instructions       General Comments      Pertinent Vitals/ Pain       Pain Assessment: No/denies pain  Home Living                                          Prior Functioning/Environment              Frequency  Min 2X/week        Progress Toward Goals  OT Goals(current goals can now be found in the care plan section)  Progress towards OT goals: Progressing toward goals  Acute Rehab OT Goals Patient Stated Goal: patient wants to get back home OT Goal Formulation: With patient Time For  Goal Achievement: 04/05/21 Potential to Achieve Goals: Good  Plan Discharge plan needs to be updated    Co-evaluation                 AM-PAC OT "6 Clicks" Daily Activity     Outcome Measure   Help from another person eating meals?: None Help from another person taking care of personal grooming?: A Little Help from another person toileting, which includes using toliet, bedpan, or urinal?: A Little Help from another person bathing (including washing, rinsing, drying)?: A Little Help from another person to put on and taking off regular upper body clothing?: A Little Help from another person to put on and taking off regular lower body clothing?: A Little 6 Click Score: 19    End of Session Equipment Utilized During Treatment: Oxygen;Rolling walker  OT Visit Diagnosis: Muscle weakness (generalized) (M62.81)   Activity Tolerance Patient limited by fatigue   Patient Left in chair;with call bell/phone within reach   Nurse Communication Mobility status (o2 sats)        Time: 6734-1937 OT Time Calculation (min): 35 min  Charges: OT General Charges $OT Visit: 1 Visit OT Treatments $Self Care/Home Management : 23-37 mins  Borden Thune, OTR/L Acute Care Rehab Services  Office [phone] Pager: [phone]    Kelli Churn 03/28/2021, 1:22 PM

## 2021-03-28 NOTE — Discharge Summary (Signed)
Physician Discharge Summary  Laurie Pugh BHA:[id number] DOB: 1932-04-19 DOA: 03/20/2021  PCP: Crist Infante, MD  Admit date: 03/20/2021 Discharge date: 03/28/2021  Recommendations for Outpatient Follow-up:  Follow up with PCP in 7-10 days   Contact information for after-discharge care     Destination     [facility] .   Service: Skilled Nursing Contact information: [place] [phone]                      Discharge Diagnoses: Principal diagnosis is #1 Sepsis due to pneumonia Atrial fibrillation Hypertension Esophageal stricture Acute Pulmonary edema Acute metabolic encephalopathy Hypokalemia GERD Chronic pain syndrome hyperlipidemia  Discharge Condition: Fair Disposition: SNF  Diet recommendation: Heart healthy  Filed Weights   03/20/21 1446  Weight: 54.4 kg    History of present illness:  Laurie Pugh is a 87 y.o. female with medical history significant of paroxysmal atrial fibrillation, coronary artery disease, GERD, esophageal stricture, essential hypertension, hyperlipidemia, PSVT, who is currently on Eliquis who was brought in by her daughter who noted that she was confused this morning.  She complained of epigastric pain in the ER and shortness of breath.  Denied any fever or sick contact.  Patient was evaluated and found to have evidence of pneumonia.  She is not on oxygen.Marland Kitchen  She was COVID-19 negative but chest x-ray confirming pneumonia so patient being admitted to the hospital with community-acquired pneumonia and metabolic encephalopathy as a result.  Hospital Course:  Laurie Pugh is a 87 y.o. female with medical history significant of paroxysmal atrial fibrillation on Eliquis, coronary artery disease, GERD, esophageal stricture, essential hypertension, hyperlipidemia, PSVT, was brought in by her daughter due to observed confusion.  She complained of  epigastric pain in the ER and shortness of breath.  Denied any fever or sick contact.  Patient was evaluated and found to have evidence of pneumonia.  She also met severe sepsis criteria based on tachycardia, hypoxia and leukocytosis. she is not on oxygen.Marland Kitchen She was COVID-19 negative.  She was admitted to the hospital with community-acquired pneumonia and metabolic encephalopathy.  She was started on Rocephin and doxycycline.  Did not improve much and required oxygen.  BNP was elevated.  Chest x-ray also suggested possible acute pulmonary edema.  She was started on Lasix 40 mg IV twice daily and had decent diuresis and she started feeling better.  Transthoracic echo showed normal ejection fraction and no wall motion would not be.  BNP was elevated so she had combination of pneumonia and acute on chronic congestive heart failure with preserved ejection fraction.  She was evaluated by PT OT who recommended home health initially however she was noted to have more weakness since yesterday so PT was called back again today and they have updated their recommendations to SNF now.  TOC consulted.  The patient will be discharged to SNF today in fair condition.  Today's assessment: S: Awake, alert, and oriented x 3. O: Vitals:  Vitals:   03/28/21 0600 03/28/21 1237  BP: 110/71 119/64  Pulse: (!) 57   Resp: 18   Temp: (!) 97.4 F (36.3 C)   SpO2: 99%    Exam:   Constitutional:  The patient is awake, alert, and oriented x 3. No acute distress. Respiratory:  No increased work of breathing. No wheezes, rales, or rhonchi No tactile fremitus Cardiovascular:  Regular rate and rhythm No murmurs, ectopy, or gallups. No lateral  PMI. No thrills. Abdomen:  Abdomen is soft, non-tender, non-distended No hernias, masses, or organomegaly Normoactive bowel sounds. Musculoskeletal:  No cyanosis, clubbing, or edema Skin:  No rashes, lesions, ulcers palpation of skin: no induration or nodules Neurologic:  CN  2-12 intact Sensation all 4 extremities intact Psychiatric:  Mental status Mood, affect appropriate Orientation to person, place, time judgment and insight appear intact  Discharge Instructions  Discharge Instructions     Activity as tolerated - No restrictions   Complete by: As directed    Call MD for:  difficulty breathing, headache or visual disturbances   Complete by: As directed    Call MD for:  temperature >100.4   Complete by: As directed    Diet - low sodium heart healthy   Complete by: As directed    Discharge instructions   Complete by: As directed    Follow up with PCP in 7-10 days.   Increase activity slowly   Complete by: As directed    No wound care   Complete by: As directed       Allergies as of 03/28/2021       Reactions   Codeine Nausea And Vomiting   Erythromycin Other (See Comments)   Mycins cause severe nausea/vomiting and diarrhea (thinks it was erythromycin)        Medication List     TAKE these medications    apixaban 5 MG Tabs tablet Commonly known as: ELIQUIS Take 2.5 mg by mouth 2 (two) times daily.   atenolol 25 MG tablet Commonly known as: TENORMIN Take 1 tablet (25 mg total) by mouth 2 (two) times daily. What changed: when to take this   benazepril 20 MG tablet Commonly known as: LOTENSIN Take 20 mg by mouth daily.   chlorthalidone 25 MG tablet Commonly known as: HYGROTON Take 12.5-25 mg by mouth daily as needed (for SBP >140).   doxycycline 100 MG tablet Commonly known as: VIBRA-TABS Take 1 tablet (100 mg total) by mouth every 12 (twelve) hours for 5 days.   gabapentin 100 MG capsule Commonly known as: NEURONTIN Take 100 mg by mouth 3 (three) times daily.   LORazepam 1 MG tablet Commonly known as: ATIVAN Take 0.5-1 mg by mouth 2 (two) times daily as needed for anxiety.   multivitamin with minerals tablet Take 1 tablet by mouth daily.   omeprazole 40 MG capsule Commonly known as: PRILOSEC TAKE 1 CAPSULE BY  MOUTH  DAILY   oxyCODONE-acetaminophen 5-325 MG tablet Commonly known as: PERCOCET/ROXICET Take 1 tablet by mouth every 6 (six) hours as needed (pain).   polyvinyl alcohol 1.4 % ophthalmic solution Commonly known as: LIQUIFILM TEARS Place 1 drop into both eyes as needed for dry eyes.   rosuvastatin 10 MG tablet Commonly known as: CRESTOR Take 10 mg by mouth daily.               Durable Medical Equipment  (From admission, onward)           Start     Ordered   03/25/21 0808  For home use only DME Walker rolling  Once       Question Answer Comment  Walker: With 5 Inch Wheels   Patient needs a walker to treat with the following condition CHF (congestive heart failure) ([place])      03/25/21 0807           Allergies  Allergen Reactions   Codeine Nausea And Vomiting   Erythromycin Other (See Comments)  Mycins cause severe nausea/vomiting and diarrhea (thinks it was erythromycin)    The results of significant diagnostics from this hospitalization (including imaging, microbiology, ancillary and laboratory) are listed below for reference.    Significant Diagnostic Studies: CT Head Wo Contrast  Result Date: 03/20/2021 CLINICAL DATA:  Altered mental status for 2 days EXAM: CT HEAD WITHOUT CONTRAST TECHNIQUE: Contiguous axial images were obtained from the base of the skull through the vertex without intravenous contrast. COMPARISON:  06/21/2017 FINDINGS: Brain: No evidence of acute infarction, hemorrhage, hydrocephalus, extra-axial collection or mass lesion/mass effect. Mild periventricular white matter hypodensity and global cerebral volume loss. Vascular: No hyperdense vessel or unexpected calcification. Skull: Normal. Negative for fracture or focal lesion. Sinuses/Orbits: No acute finding. Other: None. IMPRESSION: No acute intracranial pathology. Small-vessel white matter disease and global cerebral volume loss in keeping with advanced patient age. Electronically Signed    By: Eddie Candle M.D.   On: 03/20/2021 16:26   CT ABDOMEN PELVIS W CONTRAST  Result Date: 03/20/2021 CLINICAL DATA:  Epigastric pain. EXAM: CT ABDOMEN AND PELVIS WITH CONTRAST TECHNIQUE: Multidetector CT imaging of the abdomen and pelvis was performed using the standard protocol following bolus administration of intravenous contrast. CONTRAST:  61m OMNIPAQUE IOHEXOL 300 MG/ML  SOLN COMPARISON:  CT abdomen pelvis dated July 01, 2019. FINDINGS: Lower chest: Mild ground-glass density in the right greater than left lower lobes. Trace right pleural effusion. Hepatobiliary: No focal liver abnormality. Multiple tiny gallstones again noted. Asymmetric ill-defined gallbladder wall thickening adjacent to the liver (series 2, image 32). No surrounding inflammatory changes. No biliary dilatation. Pancreas: Unremarkable. No pancreatic ductal dilatation or surrounding inflammatory changes. Spleen: Normal in size without focal abnormality. Adrenals/Urinary Tract: The adrenal glands are unremarkable. Slight interval increase in size of the 3.0 cm left renal cyst. Other subcentimeter low-density lesions in both kidneys are too small to characterize. No renal calculi or hydronephrosis. Punctate calculus in the bladder. Stomach/Bowel: Unchanged large hiatal hernia. No obstruction or bowel wall thickening. Vascular/Lymphatic: Aortic atherosclerosis. No enlarged abdominal or pelvic lymph nodes. Reproductive: Status post hysterectomy. No adnexal masses. Other: Interval increase in size of the heterogeneous fat containing mass in the presacral space, currently measuring 3.1 x 3.6 cm (series 2, image 59), previously 2.3 x 2.7 cm. No free fluid or pneumoperitoneum. Musculoskeletal: No acute or significant osseous findings. Chronic L1 compression deformity. IMPRESSION: 1. Cholelithiasis. Asymmetric ill-defined gallbladder wall thickening adjacent to the liver. Further evaluation with right upper quadrant ultrasound is recommended to  exclude acute cholecystitis. 2. Interval increase in size of the heterogeneous fat containing mass in the presacral space, currently measuring 3.1 x 3.6 cm, previously 2.3 x 2.7 cm. Differential considerations include myelolipoma or liposarcoma. 3. Mild ground-glass density in the right greater than left lower lobes, concerning for aspiration in the setting of a large hiatal hernia. Trace right pleural effusion. 4. Punctate bladder calculus. 5. Aortic Atherosclerosis (ICD10-I70.0). Electronically Signed   By: WTitus DubinM.D.   On: 03/20/2021 18:39   DG CHEST PORT 1 VIEW  Result Date: 03/25/2021 CLINICAL DATA:  [age]year old female with hypoxia. EXAM: PORTABLE CHEST 1 VIEW COMPARISON:  Chest radiograph dated 06/26/2004. FINDINGS: Background of emphysema. No focal consolidation, pleural effusion, pneumothorax. Mild cardiomegaly. Atherosclerotic calcification of the aorta. No acute osseous pathology. IMPRESSION: No active disease. Electronically Signed   By: AAnner CreteM.D.   On: 03/25/2021 19:30   DG CHEST PORT 1 VIEW  Result Date: 03/24/2021 CLINICAL DATA:  87year old female with altered mental status. Sepsis.  EXAM: PORTABLE CHEST 1 VIEW COMPARISON:  Portable chest 03/20/2021 and earlier. FINDINGS: Portable AP semi upright view at 0806 hours. Chronic left diaphragmatic hernia. Stable cardiac size and mediastinal contours. Stable lung volumes. Small right pleural effusion is more apparent. Small left pleural effusion is possible. But lung markings appear stable since 2018 with chronic interstitial prominence, and emphysema demonstrated on a 2020 chest CT. No pneumothorax. No air bronchograms. No acute osseous abnormality identified. Negative visible bowel gas pattern. IMPRESSION: 1. Small pleural effusions suspected but no other acute cardiopulmonary abnormality. 2. Chronic left diaphragmatic hernia and pulmonary interstitial changes. Emphysema (ICD10-J43.9). Electronically Signed   By: Genevie Ann M.D.    On: 03/24/2021 08:50   DG Chest Port 1 View  Result Date: 03/20/2021 CLINICAL DATA:  Altered mental status. EXAM: PORTABLE CHEST 1 VIEW COMPARISON:  Jan 29, 2017 FINDINGS: Mild to moderate severity diffusely increased interstitial lung markings are seen. Mild atelectasis and/or infiltrate is noted within the bilateral lung bases. There is no evidence of a pleural effusion or pneumothorax. The cardiac silhouette is mildly enlarged. Marked severity calcification of the aortic arch is seen. There is a large hiatal hernia. The visualized skeletal structures are unremarkable. IMPRESSION: 1. Cardiomegaly and diffusely increased interstitial lung markings which are likely, in part, chronic in nature. A superimposed component of interstitial edema cannot be excluded. 2. Mild bibasilar atelectasis and/or infiltrate. 3. Large hiatal. Electronically Signed   By: Virgina Norfolk M.D.   On: 03/20/2021 16:47   ECHOCARDIOGRAM COMPLETE  Result Date: 03/22/2021    ECHOCARDIOGRAM REPORT   Patient Name:   Laurie Pugh Sarris Date of Exam: 03/22/2021 Medical Rec #:  [id number]         Height:       63.0 in Accession #:    [id number]        Weight:       120.0 lb Date of Birth:  01/25/1932        BSA:          1.556 m Patient Age:    89 years          BP:           170/94 mmHg Patient Gender: F                 HR:           84 bpm. Exam Location:  Inpatient Procedure: 2D Echo, Cardiac Doppler and Color Doppler Indications:    CHF  History:        Patient has prior history of Echocardiogram examinations, most                 recent 08/27/2008. CHF, CAD, Arrythmias:Atrial Fibrillation,                 Signs/Symptoms:Shortness of Breath and Pneumonia, sepsis; Risk                 Factors:Hypertension and Dyslipidemia.  Sonographer:    Dustin Flock Referring Phys: [id number] RAVI PAHWANI  Sonographer Comments: Image acquisition challenging due to respiratory motion. IMPRESSIONS  1. Left ventricular ejection fraction, by estimation,  is 60 to 65%. The left ventricle has normal function. The left ventricle has no regional wall motion abnormalities. There is mild concentric left ventricular hypertrophy. Left ventricular diastolic parameters are indeterminate. Elevated left atrial pressure.  2. Right ventricular systolic function was not well visualized, but is likely low normal. The right ventricular size is normal. There is moderately  elevated pulmonary artery systolic pressure.  3. Left atrial size was severely dilated.  4. Right atrial size was mildly dilated.  5. The mitral valve is normal in structure. No evidence of mitral valve regurgitation.  6. Tricuspid valve regurgitation is mild to moderate; valve morphology is not well visualized but a mechanism of valve prolapse is suggestive by Spectral Doppler signal.  7. The aortic valve is tricuspid. Aortic valve regurgitation is mild. Aortic regurgitation PHT measures 694 msec. Comparison(s): A prior study was performed on 06/28/2004. Prior images reviewed side by side. Similar study, aortic regurgitation is new from prior. FINDINGS  Left Ventricle: Left ventricular ejection fraction, by estimation, is 60 to 65%. The left ventricle has normal function. The left ventricle has no regional wall motion abnormalities. The left ventricular internal cavity size was small. There is mild concentric left ventricular hypertrophy. Left ventricular diastolic parameters are indeterminate. Elevated left atrial pressure. Right Ventricle: The right ventricular size is normal. No increase in right ventricular wall thickness. Right ventricular systolic function was not well visualized. There is moderately elevated pulmonary artery systolic pressure. The tricuspid regurgitant velocity is 3.71 m/s, and with an assumed right atrial pressure of 3 mmHg, the estimated right ventricular systolic pressure is 56.3 mmHg. Left Atrium: Left atrial size was severely dilated. Right Atrium: Right atrial size was mildly dilated.  Pericardium: There is no evidence of pericardial effusion. Mitral Valve: The mitral valve is normal in structure. No evidence of mitral valve regurgitation. MV peak gradient, 9.1 mmHg. The mean mitral valve gradient is 3.0 mmHg. Tricuspid Valve: The tricuspid valve is not well visualized. Tricuspid valve regurgitation is mild to moderate. Aortic Valve: The aortic valve is tricuspid. Aortic valve regurgitation is mild. Aortic regurgitation PHT measures 694 msec. Pulmonic Valve: The pulmonic valve was grossly normal. Pulmonic valve regurgitation is trivial. No evidence of pulmonic stenosis. Aorta: The aortic root is normal in size and structure. IAS/Shunts: The atrial septum is grossly normal.  LEFT VENTRICLE PLAX 2D LVIDd:         2.70 cm  Diastology LVIDs:         1.70 cm  LV e' medial:    5.11 cm/s LV PW:         1.10 cm  LV E/e' medial:  21.9 LV IVS:        1.10 cm  LV e' lateral:   4.24 cm/s LVOT diam:     2.10 cm  LV E/e' lateral: 26.4 LV SV:         44 LV SV Index:   28 LVOT Area:     3.46 cm  RIGHT VENTRICLE RV Basal diam:  3.10 cm RV S prime:     8.81 cm/s TAPSE (M-mode): 0.8 cm LEFT ATRIUM             Index       RIGHT ATRIUM           Index LA diam:        2.70 cm 1.73 cm/m  RA Area:     19.70 cm LA Vol (A2C):   64.5 ml 41.44 ml/m RA Volume:   54.00 ml  34.70 ml/m LA Vol (A4C):   85.1 ml 54.68 ml/m LA Biplane Vol: 74.0 ml 47.55 ml/m  AORTIC VALVE LVOT Vmax:   71.70 cm/s LVOT Vmean:  44.400 cm/s LVOT VTI:    0.126 m AI PHT:      694 msec  AORTA Ao Root diam: 3.30 cm MITRAL VALVE  TRICUSPID VALVE MV Area (PHT): 12.24 cm    TR Peak grad:   55.1 mmHg MV Area VTI:   2.00 cm     TR Vmax:        371.00 cm/s MV Peak grad:  9.1 mmHg MV Mean grad:  3.0 mmHg     SHUNTS MV Vmax:       1.51 m/s     Systemic VTI:  0.13 m MV Vmean:      80.1 cm/s    Systemic Diam: 2.10 cm MV Decel Time: 62 msec MV E velocity: 112.00 cm/s MV A velocity: 88.30 cm/s MV E/A ratio:  1.27 Rudean Haskell MD  Electronically signed by Rudean Haskell MD Signature Date/Time: 03/22/2021/3:30:00 PM    Final    VAS [place] LOWER EXTREMITY VENOUS (DVT) (ONLY [facility] & [facility])  Result Date: 03/21/2021  Lower Venous DVT Study Patient Name:  Laurie Pugh  Date of Exam:   03/20/2021 Medical Rec #: [id number]          Accession #:    [id number] Date of Birth: 1932-04-14         Patient Gender: F Patient Age:   88Y Exam Location:  [facility] Procedure:      VAS [place] LOWER EXTREMITY VENOUS (DVT) Referring Phys: [id number] Rudell Cobb BADALAMENTE --------------------------------------------------------------------------------  Indications: Swelling.  Comparison Study: Prior negative Right LEV done 01/05/21 Performing Technologist: Sharion Dove RVS  Examination Guidelines: A complete evaluation includes B-mode imaging, spectral Doppler, color Doppler, and power Doppler as needed of all accessible portions of each vessel. Bilateral testing is considered an integral part of a complete examination. Limited examinations for reoccurring indications may be performed as noted. The reflux portion of the exam is performed with the patient in reverse Trendelenburg.  +---------+---------------+---------+-----------+----------+--------------+ RIGHT    CompressibilityPhasicitySpontaneityPropertiesThrombus Aging +---------+---------------+---------+-----------+----------+--------------+ CFV      Full           Yes      Yes                                 +---------+---------------+---------+-----------+----------+--------------+ SFJ      Full                                                        +---------+---------------+---------+-----------+----------+--------------+ FV Prox  Full                                                        +---------+---------------+---------+-----------+----------+--------------+ FV Mid   Full                                                         +---------+---------------+---------+-----------+----------+--------------+ FV DistalFull                                                        +---------+---------------+---------+-----------+----------+--------------+  PFV      Full                                                        +---------+---------------+---------+-----------+----------+--------------+ POP      Full           Yes      Yes                                 +---------+---------------+---------+-----------+----------+--------------+ PTV      Full                                                        +---------+---------------+---------+-----------+----------+--------------+ PERO     Full                                                        +---------+---------------+---------+-----------+----------+--------------+   +----+---------------+---------+-----------+----------+--------------+ LEFTCompressibilityPhasicitySpontaneityPropertiesThrombus Aging +----+---------------+---------+-----------+----------+--------------+ CFV Full           Yes      Yes                                 +----+---------------+---------+-----------+----------+--------------+     Summary: RIGHT: - Findings appear essentially unchanged compared to previous examination. - There is no evidence of deep vein thrombosis in the lower extremity.  LEFT: - No evidence of common femoral vein obstruction.  *See table(s) above for measurements and observations. Electronically signed by Servando Snare MD on 03/21/2021 at 12:25:40 PM.    Final    US Abdomen Limited RUQ (LIVER/GB)  Result Date: 03/20/2021 CLINICAL DATA:  Cholelithiasis. EXAM: ULTRASOUND ABDOMEN LIMITED RIGHT UPPER QUADRANT COMPARISON:  None. FINDINGS: Gallbladder: A 1.4 cm lobulated echogenic area is seen within the dependent portion of the gallbladder lumen. A mild amount of surrounding heterogeneous gallbladder sludge is also noted. The gallbladder wall measures  2.6 mm in thickness. No sonographic Murphy sign noted by sonographer. Common bile duct: Diameter: 3.6 mm Liver: No focal lesion identified. Mild, diffusely increased echogenicity of the liver parenchyma is noted. Portal vein is patent on color Doppler imaging with normal direction of blood flow towards the liver. Other: Of incidental note is the presence of a small right-sided pleural effusion. IMPRESSION: 1. Gallbladder sludge and suspected cholelithiasis, without acute cholecystitis. 2. Fatty liver. Electronically Signed   By: Virgina Norfolk M.D.   On: 03/20/2021 20:19    Microbiology: Recent Results (from the past 240 hour(s))  Urine culture     Status: Abnormal   Collection Time: 03/20/21  3:38 PM   Specimen: Urine, Random  Result Value Ref Range Status   Specimen Description   Final    URINE, RANDOM Performed at [facility] [address], [place], [place] [address]    Special Requests   Final    NONE Performed at [facility], [address] [place],  [place] [address]    Culture MULTIPLE SPECIES PRESENT, SUGGEST RECOLLECTION (A)  Final   Report Status 03/22/2021 FINAL  Final  Resp Panel by RT-PCR (Flu A&B, Covid) Nasopharyngeal Swab     Status: None   Collection Time: 03/20/21  3:38 PM   Specimen: Nasopharyngeal Swab; Nasopharyngeal(NP) swabs in vial transport medium  Result Value Ref Range Status   SARS Coronavirus 2 by RT PCR NEGATIVE NEGATIVE Final    Comment: (NOTE) SARS-CoV-2 target nucleic acids are NOT DETECTED.  The SARS-CoV-2 RNA is generally detectable in upper respiratory specimens during the acute phase of infection. The lowest concentration of SARS-CoV-2 viral copies this assay can detect is 138 copies/mL. A negative result does not preclude SARS-Cov-2 infection and should not be used as the sole basis for treatment or other patient management decisions. A negative result may occur with  improper specimen  collection/handling, submission of specimen other than nasopharyngeal swab, presence of viral mutation(s) within the areas targeted by this assay, and inadequate number of viral copies(<138 copies/mL). A negative result must be combined with clinical observations, patient history, and epidemiological information. The expected result is Negative.  Fact Sheet for Patients:  EntrepreneurPulse.com.au  Fact Sheet for Healthcare Providers:  IncredibleEmployment.be  This test is no t yet approved or cleared by the [place] FDA and  has been authorized for detection and/or diagnosis of SARS-CoV-2 by FDA under an Emergency Use Authorization (EUA). This EUA will remain  in effect (meaning this test can be used) for the duration of the COVID-19 declaration under Section 564(b)(1) of the Act, 21 U.S.C.section 360bbb-3(b)(1), unless the authorization is terminated  or revoked sooner.       Influenza A by PCR NEGATIVE NEGATIVE Final   Influenza B by PCR NEGATIVE NEGATIVE Final    Comment: (NOTE) The Xpert Xpress SARS-CoV-2/FLU/RSV plus assay is intended as an aid in the diagnosis of influenza from Nasopharyngeal swab specimens and should not be used as a sole basis for treatment. Nasal washings and aspirates are unacceptable for Xpert Xpress SARS-CoV-2/FLU/RSV testing.  Fact Sheet for Patients: EntrepreneurPulse.com.au  Fact Sheet for Healthcare Providers: IncredibleEmployment.be  This test is not yet approved or cleared by the [place] FDA and has been authorized for detection and/or diagnosis of SARS-CoV-2 by FDA under an Emergency Use Authorization (EUA). This EUA will remain in effect (meaning this test can be used) for the duration of the COVID-19 declaration under Section 564(b)(1) of the Act, 21 U.S.C. section 360bbb-3(b)(1), unless the authorization is terminated or revoked.  Performed at [facility], [place] [address], [place], [place] [address]   Culture, blood (routine x 2) Call MD if unable to obtain prior to antibiotics being given     Status: None   Collection Time: 03/21/21 12:24 AM   Specimen: BLOOD  Result Value Ref Range Status   Specimen Description   Final    BLOOD RIGHT ANTECUBITAL Performed at [facility] [address] [place], [place] [address]    Special Requests   Final    BOTTLES DRAWN AEROBIC ONLY Blood Culture adequate volume Performed at [facility] [address] [place], [place] [address]    Culture   Final    NO GROWTH 5 DAYS Performed at [facility], [place] [address], [place], [place] [address]    Report Status 03/26/2021 FINAL  Final  Culture, blood (routine x 2) Call MD if unable to obtain prior to antibiotics being given  Status: Abnormal   Collection Time: 03/21/21 12:24 AM   Specimen: BLOOD  Result Value Ref Range Status   Specimen Description   Final    BLOOD LEFT HAND Performed at [facility] [address], [place], [place] [address]    Special Requests   Final    BOTTLES DRAWN AEROBIC ONLY Blood Culture adequate volume Performed at [facility] [address], [place], [place] [address]    Culture  Setup Time (A)  Final    GRAM VARIABLE ROD AEROBIC BOTTLE ONLY CRITICAL RESULT CALLED TO, READ BACK BY AND VERIFIED WITH: E,WILLIAMSON PHARMD _0  03/21/21 EB    Culture (A)  Final    BACILLUS SPECIES Standardized susceptibility testing for this organism is not available. Performed at [facility], [place] [address] [place], [place] [address]    Report Status 03/22/2021 FINAL  Final  Blood Culture ID Panel (Reflexed)     Status: None   Collection Time: 03/21/21 12:24 AM  Result Value Ref Range Status   Enterococcus faecalis NOT DETECTED NOT DETECTED Final   Enterococcus Faecium NOT DETECTED NOT DETECTED Final    Listeria monocytogenes NOT DETECTED NOT DETECTED Final   Staphylococcus species NOT DETECTED NOT DETECTED Final   Staphylococcus aureus (BCID) NOT DETECTED NOT DETECTED Final   Staphylococcus epidermidis NOT DETECTED NOT DETECTED Final   Staphylococcus lugdunensis NOT DETECTED NOT DETECTED Final   Streptococcus species NOT DETECTED NOT DETECTED Final   Streptococcus agalactiae NOT DETECTED NOT DETECTED Final   Streptococcus pneumoniae NOT DETECTED NOT DETECTED Final   Streptococcus pyogenes NOT DETECTED NOT DETECTED Final   A.calcoaceticus-baumannii NOT DETECTED NOT DETECTED Final   Bacteroides fragilis NOT DETECTED NOT DETECTED Final   Enterobacterales NOT DETECTED NOT DETECTED Final   Enterobacter cloacae complex NOT DETECTED NOT DETECTED Final   Escherichia coli NOT DETECTED NOT DETECTED Final   Klebsiella aerogenes NOT DETECTED NOT DETECTED Final   Klebsiella oxytoca NOT DETECTED NOT DETECTED Final   Klebsiella pneumoniae NOT DETECTED NOT DETECTED Final   Proteus species NOT DETECTED NOT DETECTED Final   Salmonella species NOT DETECTED NOT DETECTED Final   Serratia marcescens NOT DETECTED NOT DETECTED Final   Haemophilus influenzae NOT DETECTED NOT DETECTED Final   Neisseria meningitidis NOT DETECTED NOT DETECTED Final   Pseudomonas aeruginosa NOT DETECTED NOT DETECTED Final   Stenotrophomonas maltophilia NOT DETECTED NOT DETECTED Final   Candida albicans NOT DETECTED NOT DETECTED Final   Candida auris NOT DETECTED NOT DETECTED Final   Candida glabrata NOT DETECTED NOT DETECTED Final   Candida krusei NOT DETECTED NOT DETECTED Final   Candida parapsilosis NOT DETECTED NOT DETECTED Final   Candida tropicalis NOT DETECTED NOT DETECTED Final   Cryptococcus neoformans/gattii NOT DETECTED NOT DETECTED Final    Comment: Performed at [facility], [place] [address], [place], [place] [address]  SARS CORONAVIRUS 2 (TAT 6-24 HRS) Nasopharyngeal Nasopharyngeal Swab     Status: None    Collection Time: 03/26/21  5:09 PM   Specimen: Nasopharyngeal Swab  Result Value Ref Range Status   SARS Coronavirus 2 NEGATIVE NEGATIVE Final    Comment: (NOTE) SARS-CoV-2 target nucleic acids are NOT DETECTED.  The SARS-CoV-2 RNA is generally detectable in upper and lower respiratory specimens during the acute phase of infection. Negative results do not preclude SARS-CoV-2 infection, do not rule out co-infections with other pathogens, and should not be used as the sole basis for treatment or other patient management decisions.  Negative results must be combined with clinical observations, patient history, and epidemiological information. The expected result is Negative.  Fact Sheet for Patients: SugarRoll.be  Fact Sheet for Healthcare Providers: https://www.woods-mathews.com/  This test is not yet approved or cleared by the [place] FDA and  has been authorized for detection and/or diagnosis of SARS-CoV-2 by FDA under an Emergency Use Authorization (EUA). This EUA will remain  in effect (meaning this test can be used) for the duration of the COVID-19 declaration under Se ction 564(b)(1) of the Act, 21 U.S.C. section 360bbb-3(b)(1), unless the authorization is terminated or revoked sooner.  Performed at [facility], [place] [address], [place], [place] [address]      Labs: Basic Metabolic Panel: Recent Labs  Lab 03/23/21 0507 03/24/21 0538 03/25/21 0543 03/25/21 1651 03/26/21 0456  NA 139 139 136 135 135  K 3.2* 4.1 3.3* 5.1 4.9  CL 101 101 90* 91* 92*  CO2 27 26 33* 28 30  GLUCOSE 80 81 106* 116* 98  BUN _0 25*  CREATININE 0.64 0.58 0.70 0.91 0.82  CALCIUM 8.4* 9.2 9.7 10.1 9.7  MG  --   --   --   --  1.7   Liver Function Tests: No results for input(s): AST, ALT, ALKPHOS, BILITOT, PROT, ALBUMIN in the last 168 hours. No results for input(s): LIPASE, AMYLASE in the last 168 hours. No results for  input(s): AMMONIA in the last 168 hours. CBC: Recent Labs  Lab 03/23/21 0507 03/24/21 0538  WBC 10.2 8.7  NEUTROABS 8.2* 6.6  HGB 13.5 14.7  HCT 43.7 46.2*  MCV 97.8 95.9  PLT 149* 185   Cardiac Enzymes: No results for input(s): CKTOTAL, CKMB, CKMBINDEX, TROPONINI in the last 168 hours. BNP: BNP (last 3 results) Recent Labs    03/20/21 1538 03/23/21 1016 03/25/21 1651  BNP 747.0* 393.7* 169.7*    ProBNP (last 3 results) No results for input(s): PROBNP in the last 8760 hours.  CBG: Recent Labs  Lab 03/25/21 1207  GLUCAP 150*    Principal Problem:   Pneumonia Active Problems:   Atrial fibrillation (HCC)   Essential hypertension   Esophageal stricture   Chronic anticoagulation   Pressure injury of skin   Time coordinating discharge: 38 minutes.  Signed:        Cheryle Dark, DO [facility]  03/28/2021, 12:54 PM

## 2021-03-28 NOTE — Progress Notes (Signed)
Report called to [facility]. Daughter with Patient waiting for [facility] to come pick her up and take her [facility]. O2 sats 92-98% on room air. Medical necessity foam ready for PTAR.

## 2021-03-29 DIAGNOSIS — J810 Acute pulmonary edema: Secondary | ICD-10-CM

## 2021-03-29 NOTE — Progress Notes (Signed)
Physical Therapy Treatment Patient Details Name: Laurie Pugh MRN: [id number] DOB: October 09, 1931 Today's Date: 03/29/2021    History of Present Illness Pt admitted from home with AMS and dx with severe sepsis 2* PNA.  Pt with hx of a-fib, CAD, and lumbar vertebroplasty in 2020.    PT Comments    Pt participated well. O2 >90% on RA once back to bed. Plan is for d/c to SNF today.    Follow Up Recommendations  SNF     Equipment Recommendations   (TBD at next venue)    Recommendations for Other Services       Precautions / Restrictions Precautions Precautions: Fall Precaution Comments: monitor RA and HR Restrictions Weight Bearing Restrictions: No    Mobility  Bed Mobility Overal bed mobility: Needs Assistance Bed Mobility: Supine to Sit;Sit to Supine     Supine to sit: Supervision Sit to supine: Supervision        Transfers Overall transfer level: Needs assistance Equipment used: 4-wheeled walker Transfers: Sit to/from Stand Sit to Stand: Supervision         General transfer comment: Supv for safety. cues for proper operation of rollator  Ambulation/Gait Ambulation/Gait assistance: Min guard Gait Distance (Feet): 75 Feet Assistive device: Rolling walker (2 wheeled) Gait Pattern/deviations: Step-through pattern;Decreased stride length     General Gait Details: Min guard for safety. Pt denied dyspnea. Tolerated distance well.   Stairs             Wheelchair Mobility    Modified Rankin (Stroke Patients Only)       Balance Overall balance assessment: Needs assistance         Standing balance support: Bilateral upper extremity supported Standing balance-Leahy Scale: Fair                              Cognition Arousal/Alertness: Awake/alert Behavior During Therapy: WFL for tasks assessed/performed Overall Cognitive Status: Within Functional Limits for tasks assessed                                         Exercises General Exercises - Lower Extremity Long Arc Quad: AROM;Both;15 reps;Seated    General Comments        Pertinent Vitals/Pain Pain Assessment: No/denies pain    Home Living                      Prior Function            PT Goals (current goals can now be found in the care plan section) Progress towards PT goals: Progressing toward goals    Frequency    Min 3X/week      PT Plan Current plan remains appropriate    Co-evaluation              AM-PAC PT "6 Clicks" Mobility   Outcome Measure  Help needed turning from your back to your side while in a flat bed without using bedrails?: A Little Help needed moving from lying on your back to sitting on the side of a flat bed without using bedrails?: A Little Help needed moving to and from a bed to a chair (including a wheelchair)?: A Little Help needed standing up from a chair using your arms (e.g., wheelchair or bedside chair)?: A Little Help needed to walk in hospital room?:  A Little Help needed climbing 3-5 steps with a railing? : A Lot 6 Click Score: 17    End of Session Equipment Utilized During Treatment: Gait belt Activity Tolerance: Patient tolerated treatment well Patient left: in bed;with call bell/phone within reach;with bed alarm set;with family/visitor present   PT Visit Diagnosis: Muscle weakness (generalized) (M62.81);Difficulty in walking, not elsewhere classified (R26.2)     Time: 6440-3474 PT Time Calculation (min) (ACUTE ONLY): 17 min  Charges:  $Gait Training: 8-22 mins                         Faye Ramsay, PT Acute Rehabilitation  Office: [phone] Pager: [phone]

## 2021-03-29 NOTE — TOC Progression Note (Addendum)
Transition of Care [facility] - Progression Note    Patient Details  Name: JUDA LAJEUNESSE MRN: [id number] Date of Birth: August 01, 1932  Transition of Care [facility]) CM/SW Contact  Golda Acre, RN Phone Number: 03/29/2021, 9:38 AM  Clinical Narrative:    Per Dr. Isidoro Donning patient is ready for dc to [facility].  [id number] to see if they can accept due to holiday 1034/tct-Trcey harden at [facility] message left to please call back. 1038/[facility]ct-[facility]-left message on voice mail of [facility] that patient is ready to come to please call me back. Patient sent to [facility] snf in [place] via ptar. Expected Discharge Plan: Home/Self Care Barriers to Discharge: Continued Medical Work up  Expected Discharge Plan and Services Expected Discharge Plan: Home/Self Care   Discharge Planning Services: CM Consult Post Acute Care Choice: Home Health Living arrangements for the past 2 months: Mobile Home Expected Discharge Date: 03/28/21                                     Social Determinants of Health (SDOH) Interventions    Readmission Risk Interventions No flowsheet data found.

## 2021-03-29 NOTE — Care Management Important Message (Signed)
Important Message  Patient Details IM Letter given to the Patient. Name: Laurie Pugh MRN: [id number] Date of Birth: December 25, 1931   Medicare Important Message Given:  Yes     Caren Macadam 03/29/2021, 11:05 AM

## 2021-03-29 NOTE — Progress Notes (Signed)
[facility]                                                                              Patient Demographics  Laurie Pugh, is a 87 y.o. female, DOB - 1932-07-10, ZOX:[id number]  Admit date - 03/20/2021   Admitting Physician Rometta Emery, MD  Outpatient Primary MD for the patient is Rodrigo Ran, MD  Outpatient specialists:   LOS - 9  days   Medical records reviewed and are as summarized below:    Chief Complaint  Patient presents with   Altered Mental Status       Brief summary   Laurie Pugh is a 87 y.o. female with medical history significant of paroxysmal atrial fibrillation, coronary artery disease, GERD, esophageal stricture, essential hypertension, hyperlipidemia, PSVT, who is currently on Eliquis who was brought in by her daughter who noted that she was confused this morning.  She complained of epigastric pain in the ER and shortness of breath.  Denied any fever or sick contact.  Patient was evaluated and found to have evidence of pneumonia.  She is not on oxygen.Marland Kitchen  She was COVID-19 negative but chest x-ray confirming pneumonia so patient being admitted to the hospital with community-acquired pneumonia and metabolic encephalopathy as a result.   Assessment & Plan     Community-acquired pneumonia with severe sepsis, acute hypoxic respiratory failure present on admission -Patient has completed 5 days of IV antibiotics, currently medically stable to be discharged to skilled nursing facility -SLP evaluation on 7/2 done, recommended regular diet with thin liquids -O2 sats 94% on room air, sitting up in the chair, tolerating diet -Was not discharged yesterday due to lack of transportation.  DC summary done by Dr. Gerri Lins on 7/3 still stands, no changes  Acute pulmonary edema -Patient received IV Lasix for 2 days, chest x-ray repeated showed improved pulmonary edema and lungs clear to auscultation.  No further diuretics needed  Acute  metabolic encephalopathy -Resolved, currently fully alert and oriented    Atrial fibrillation (HCC) -Rate controlled, continue atenolol, eliquis    Essential hypertension -Controlled, continue atenolol, chlorthalidone, Lotensin  GERD, esophageal stricture -Continue PPI  Hyperlipidemia -Continue Crestor    Pressure injury of skin Stage I, sacrum   Code Status: Full code DVT Prophylaxis:  apixaban (ELIQUIS) tablet 2.5 mg Start: 03/21/21 0045 apixaban (ELIQUIS) tablet 2.5 mg   Level of Care: Level of care: Telemetry Family Communication: Discussed all imaging results, lab results, explained to the patient    Disposition Plan:     Status is: Inpatient.  Medically stable for discharge to skilled nursing facility.  DC summary done by Dr. Gerri Lins on 7/3.  No changes  Time Spent in minutes 20 minutes  Procedures:  None  Consultants:   None  Antimicrobials:   Anti-infectives (From admission, onward)    Start     Dose/Rate Route Frequency Ordered Stop   03/28/21 0000  doxycycline (VIBRA-TABS) 100 MG tablet        100 mg Oral Every 12 hours 03/28/21 1250 04/02/21 2359   03/22/21 1800  doxycycline (VIBRA-TABS) tablet 100 mg  100 mg Oral Every 12 hours 03/22/21 1447     03/21/21 1800  doxycycline (VIBRAMYCIN) 100 mg in sodium chloride 0.9 % 250 mL IVPB  Status:  Discontinued        100 mg 125 mL/hr over 120 Minutes Intravenous Every 12 hours 03/20/21 2224 03/22/21 1447   03/21/21 1700  cefTRIAXone (ROCEPHIN) 2 g in sodium chloride 0.9 % 100 mL IVPB        2 g 200 mL/hr over 30 Minutes Intravenous Every 24 hours 03/20/21 2224 03/25/21 1821   03/20/21 1715  cefTRIAXone (ROCEPHIN) 1 g in sodium chloride 0.9 % 100 mL IVPB        1 g 200 mL/hr over 30 Minutes Intravenous  Once 03/20/21 1701 03/20/21 1919   03/20/21 1715  azithromycin (ZITHROMAX) 500 mg in sodium chloride 0.9 % 250 mL IVPB        500 mg 250 mL/hr over 60 Minutes Intravenous  Once 03/20/21 1701 03/20/21 1919           Medications  Scheduled Meds:  apixaban  2.5 mg Oral BID   atenolol  25 mg Oral BID   benazepril  20 mg Oral Daily   doxycycline  100 mg Oral Q12H   gabapentin  100 mg Oral TID   melatonin  3 mg Oral QHS   pantoprazole  40 mg Oral Daily   rosuvastatin  10 mg Oral Daily   Continuous Infusions: PRN Meds:.acetaminophen, bisacodyl, chlorthalidone, LORazepam, oxyCODONE-acetaminophen, polyethylene glycol, polyvinyl alcohol      Subjective:   Laurie Pugh was seen and examined today.  Sitting up in the chair.  No fevers or chills.  O2 sats 94% on room air.  No acute chest pain, shortness of breath, nausea vomiting or abdominal pain.  Objective:   Vitals:   03/28/21 0600 03/28/21 1237 03/28/21 2103 03/29/21 0516  BP: 110/71 119/64 (!) 106/94 109/69  Pulse: (!) 57  89 78  Resp: 18  18 16   Temp: (!) 97.4 F (36.3 C)  97.8 F (36.6 C) (!) 97.5 F (36.4 C)  TempSrc: Oral  Oral Oral  SpO2: 99%  90% 94%  Weight:      Height:        Intake/Output Summary (Last 24 hours) at 03/29/2021 0944 Last data filed at 03/29/2021 0818 Gross per 24 hour  Intake 595 ml  Output 200 ml  Net 395 ml     Wt Readings from Last 3 Encounters:  03/20/21 54.4 kg  11/09/19 56.7 kg  09/04/19 51.3 kg     Exam General: Alert and oriented x 3, NAD Cardiovascular: S1 S2 auscultated, RRR Respiratory: Fairly CTA B Gastrointestinal: Soft, nontender, nondistended, + bowel sounds Ext: no pedal edema bilaterally Neuro: no new FND's Psych: Normal affect and demeanor, alert and oriented x3    Data Reviewed:  I have personally reviewed following labs and imaging studies  Micro Results Recent Results (from the past 240 hour(s))  Urine culture     Status: Abnormal   Collection Time: 03/20/21  3:38 PM   Specimen: Urine, Random  Result Value Ref Range Status   Specimen Description   Final    URINE, RANDOM Performed at [facility], [address] [place],  [place]    Special Requests   Final    NONE Performed at [facility], [address] [place], [place]    Culture MULTIPLE SPECIES PRESENT, SUGGEST RECOLLECTION (A)  Final  Report Status 03/22/2021 FINAL  Final  Resp Panel by RT-PCR (Flu A&B, Covid) Nasopharyngeal Swab     Status: None   Collection Time: 03/20/21  3:38 PM   Specimen: Nasopharyngeal Swab; Nasopharyngeal(NP) swabs in vial transport medium  Result Value Ref Range Status   SARS Coronavirus 2 by RT PCR NEGATIVE NEGATIVE Final    Comment: (NOTE) SARS-CoV-2 target nucleic acids are NOT DETECTED.  The SARS-CoV-2 RNA is generally detectable in upper respiratory specimens during the acute phase of infection. The lowest concentration of SARS-CoV-2 viral copies this assay can detect is 138 copies/mL. A negative result does not preclude SARS-Cov-2 infection and should not be used as the sole basis for treatment or other patient management decisions. A negative result may occur with  improper specimen collection/handling, submission of specimen other than nasopharyngeal swab, presence of viral mutation(s) within the areas targeted by this assay, and inadequate number of viral copies(<138 copies/mL). A negative result must be combined with clinical observations, patient history, and epidemiological information. The expected result is Negative.  Fact Sheet for Patients:  BloggerCourse.com  Fact Sheet for Healthcare Providers:  SeriousBroker.it  This test is no t yet approved or cleared by the [place] FDA and  has been authorized for detection and/or diagnosis of SARS-CoV-2 by FDA under an Emergency Use Authorization (EUA). This EUA will remain  in effect (meaning this test can be used) for the duration of the COVID-19 declaration under Section 564(b)(1) of the Act, 21 U.S.C.section 360bbb-3(b)(1), unless the authorization is terminated  or  revoked sooner.       Influenza A by PCR NEGATIVE NEGATIVE Final   Influenza B by PCR NEGATIVE NEGATIVE Final    Comment: (NOTE) The Xpert Xpress SARS-CoV-2/FLU/RSV plus assay is intended as an aid in the diagnosis of influenza from Nasopharyngeal swab specimens and should not be used as a sole basis for treatment. Nasal washings and aspirates are unacceptable for Xpert Xpress SARS-CoV-2/FLU/RSV testing.  Fact Sheet for Patients: BloggerCourse.com  Fact Sheet for Healthcare Providers: SeriousBroker.it  This test is not yet approved or cleared by the [place] FDA and has been authorized for detection and/or diagnosis of SARS-CoV-2 by FDA under an Emergency Use Authorization (EUA). This EUA will remain in effect (meaning this test can be used) for the duration of the COVID-19 declaration under Section 564(b)(1) of the Act, 21 U.S.C. section 360bbb-3(b)(1), unless the authorization is terminated or revoked.  Performed at [facility], [address], [place], [place] [address]   Culture, blood (routine x 2) Call MD if unable to obtain prior to antibiotics being given     Status: None   Collection Time: 03/21/21 12:24 AM   Specimen: BLOOD  Result Value Ref Range Status   Specimen Description   Final    BLOOD RIGHT ANTECUBITAL Performed at [facility], [address] [place], [place] [address]    Special Requests   Final    BOTTLES DRAWN AEROBIC ONLY Blood Culture adequate volume Performed at [facility], [address] [place], [place] [address]    Culture   Final    NO GROWTH 5 DAYS Performed at [facility], [address], [place], [place] [address]    Report Status 03/26/2021 FINAL  Final  Culture, blood (routine x 2) Call MD if unable to obtain prior to antibiotics being given     Status: Abnormal   Collection Time: 03/21/21 12:24 AM   Specimen:  BLOOD  Result Value Ref Range Status   Specimen Description   Final    BLOOD LEFT HAND Performed at [facility], [address] [place], [place] [address]    Special Requests   Final    BOTTLES DRAWN AEROBIC ONLY Blood Culture adequate volume Performed at [facility], [address] [place], [place] [address]    Culture  Setup Time (A)  Final    GRAM VARIABLE ROD AEROBIC BOTTLE ONLY CRITICAL RESULT CALLED TO, READ BACK BY AND VERIFIED WITH: E,WILLIAMSON PHARMD  03/21/21 EB    Culture (A)  Final    BACILLUS SPECIES Standardized susceptibility testing for this organism is not available. Performed at [facility], [address], [place], [place] [address]    Report Status 03/22/2021 FINAL  Final  Blood Culture ID Panel (Reflexed)     Status: None   Collection Time: 03/21/21 12:24 AM  Result Value Ref Range Status   Enterococcus faecalis NOT DETECTED NOT DETECTED Final   Enterococcus Faecium NOT DETECTED NOT DETECTED Final   Listeria monocytogenes NOT DETECTED NOT DETECTED Final   Staphylococcus species NOT DETECTED NOT DETECTED Final   Staphylococcus aureus (BCID) NOT DETECTED NOT DETECTED Final   Staphylococcus epidermidis NOT DETECTED NOT DETECTED Final   Staphylococcus lugdunensis NOT DETECTED NOT DETECTED Final   Streptococcus species NOT DETECTED NOT DETECTED Final   Streptococcus agalactiae NOT DETECTED NOT DETECTED Final   Streptococcus pneumoniae NOT DETECTED NOT DETECTED Final   Streptococcus pyogenes NOT DETECTED NOT DETECTED Final   A.calcoaceticus-baumannii NOT DETECTED NOT DETECTED Final   Bacteroides fragilis NOT DETECTED NOT DETECTED Final   Enterobacterales NOT DETECTED NOT DETECTED Final   Enterobacter cloacae complex NOT DETECTED NOT DETECTED Final   Escherichia coli NOT DETECTED NOT DETECTED Final   Klebsiella aerogenes NOT DETECTED NOT DETECTED Final   Klebsiella oxytoca NOT DETECTED NOT DETECTED Final    Klebsiella pneumoniae NOT DETECTED NOT DETECTED Final   Proteus species NOT DETECTED NOT DETECTED Final   Salmonella species NOT DETECTED NOT DETECTED Final   Serratia marcescens NOT DETECTED NOT DETECTED Final   Haemophilus influenzae NOT DETECTED NOT DETECTED Final   Neisseria meningitidis NOT DETECTED NOT DETECTED Final   Pseudomonas aeruginosa NOT DETECTED NOT DETECTED Final   Stenotrophomonas maltophilia NOT DETECTED NOT DETECTED Final   Candida albicans NOT DETECTED NOT DETECTED Final   Candida auris NOT DETECTED NOT DETECTED Final   Candida glabrata NOT DETECTED NOT DETECTED Final   Candida krusei NOT DETECTED NOT DETECTED Final   Candida parapsilosis NOT DETECTED NOT DETECTED Final   Candida tropicalis NOT DETECTED NOT DETECTED Final   Cryptococcus neoformans/gattii NOT DETECTED NOT DETECTED Final    Comment: Performed at [facility], [address], [place], [place] [address]  SARS CORONAVIRUS 2 (TAT 6-24 HRS) Nasopharyngeal Nasopharyngeal Swab     Status: None   Collection Time: 03/26/21  5:09 PM   Specimen: Nasopharyngeal Swab  Result Value Ref Range Status   SARS Coronavirus 2 NEGATIVE NEGATIVE Final    Comment: (NOTE) SARS-CoV-2 target nucleic acids are NOT DETECTED.  The SARS-CoV-2 RNA is generally detectable in upper and lower respiratory specimens during the acute phase of infection. Negative results do not preclude SARS-CoV-2 infection, do not rule out co-infections with other pathogens, and should not be used as the sole basis for treatment or other patient management decisions. Negative results must be combined with clinical observations, patient history, and epidemiological information.  The expected result is Negative.  Fact Sheet for Patients: HairSlick.no  Fact Sheet for Healthcare Providers: quierodirigir.com  This test is not yet approved or cleared by the [place] FDA and  has been  authorized for detection and/or diagnosis of SARS-CoV-2 by FDA under an Emergency Use Authorization (EUA). This EUA will remain  in effect (meaning this test can be used) for the duration of the COVID-19 declaration under Se ction 564(b)(1) of the Act, 21 U.S.C. section 360bbb-3(b)(1), unless the authorization is terminated or revoked sooner.  Performed at [facility], [address] [place], [place] [address]     Radiology Reports CT Head Wo Contrast  Result Date: 03/20/2021 CLINICAL DATA:  Altered mental status for 2 days EXAM: CT HEAD WITHOUT CONTRAST TECHNIQUE: Contiguous axial images were obtained from the base of the skull through the vertex without intravenous contrast. COMPARISON:  06/21/2017 FINDINGS: Brain: No evidence of acute infarction, hemorrhage, hydrocephalus, extra-axial collection or mass lesion/mass effect. Mild periventricular white matter hypodensity and global cerebral volume loss. Vascular: No hyperdense vessel or unexpected calcification. Skull: Normal. Negative for fracture or focal lesion. Sinuses/Orbits: No acute finding. Other: None. IMPRESSION: No acute intracranial pathology. Small-vessel white matter disease and global cerebral volume loss in keeping with advanced patient age. Electronically Signed   By: Lauralyn Primes M.D.   On: 03/20/2021 16:26   CT ABDOMEN PELVIS W CONTRAST  Result Date: 03/20/2021 CLINICAL DATA:  Epigastric pain. EXAM: CT ABDOMEN AND PELVIS WITH CONTRAST TECHNIQUE: Multidetector CT imaging of the abdomen and pelvis was performed using the standard protocol following bolus administration of intravenous contrast. CONTRAST:  80mL OMNIPAQUE IOHEXOL 300 MG/ML  SOLN COMPARISON:  CT abdomen pelvis dated July 01, 2019. FINDINGS: Lower chest: Mild ground-glass density in the right greater than left lower lobes. Trace right pleural effusion. Hepatobiliary: No focal liver abnormality. Multiple tiny gallstones again noted. Asymmetric ill-defined  gallbladder wall thickening adjacent to the liver (series 2, image 32). No surrounding inflammatory changes. No biliary dilatation. Pancreas: Unremarkable. No pancreatic ductal dilatation or surrounding inflammatory changes. Spleen: Normal in size without focal abnormality. Adrenals/Urinary Tract: The adrenal glands are unremarkable. Slight interval increase in size of the 3.0 cm left renal cyst. Other subcentimeter low-density lesions in both kidneys are too small to characterize. No renal calculi or hydronephrosis. Punctate calculus in the bladder. Stomach/Bowel: Unchanged large hiatal hernia. No obstruction or bowel wall thickening. Vascular/Lymphatic: Aortic atherosclerosis. No enlarged abdominal or pelvic lymph nodes. Reproductive: Status post hysterectomy. No adnexal masses. Other: Interval increase in size of the heterogeneous fat containing mass in the presacral space, currently measuring 3.1 x 3.6 cm (series 2, image 59), previously 2.3 x 2.7 cm. No free fluid or pneumoperitoneum. Musculoskeletal: No acute or significant osseous findings. Chronic L1 compression deformity. IMPRESSION: 1. Cholelithiasis. Asymmetric ill-defined gallbladder wall thickening adjacent to the liver. Further evaluation with right upper quadrant ultrasound is recommended to exclude acute cholecystitis. 2. Interval increase in size of the heterogeneous fat containing mass in the presacral space, currently measuring 3.1 x 3.6 cm, previously 2.3 x 2.7 cm. Differential considerations include myelolipoma or liposarcoma. 3. Mild ground-glass density in the right greater than left lower lobes, concerning for aspiration in the setting of a large hiatal hernia. Trace right pleural effusion. 4. Punctate bladder calculus. 5. Aortic Atherosclerosis (ICD10-I70.0). Electronically Signed   By: Obie Dredge M.D.   On: 03/20/2021 18:39   DG CHEST PORT 1 VIEW  Result Date: 03/25/2021 CLINICAL DATA:  87 year old female with  hypoxia. EXAM:  PORTABLE CHEST 1 VIEW COMPARISON:  Chest radiograph dated 06/26/2004. FINDINGS: Background of emphysema. No focal consolidation, pleural effusion, pneumothorax. Mild cardiomegaly. Atherosclerotic calcification of the aorta. No acute osseous pathology. IMPRESSION: No active disease. Electronically Signed   By: Elgie Collard M.D.   On: 03/25/2021 19:30   DG CHEST PORT 1 VIEW  Result Date: 03/24/2021 CLINICAL DATA:  87 year old female with altered mental status. Sepsis. EXAM: PORTABLE CHEST 1 VIEW COMPARISON:  Portable chest 03/20/2021 and earlier. FINDINGS: Portable AP semi upright view at 0806 hours. Chronic left diaphragmatic hernia. Stable cardiac size and mediastinal contours. Stable lung volumes. Small right pleural effusion is more apparent. Small left pleural effusion is possible. But lung markings appear stable since 2018 with chronic interstitial prominence, and emphysema demonstrated on a 2020 chest CT. No pneumothorax. No air bronchograms. No acute osseous abnormality identified. Negative visible bowel gas pattern. IMPRESSION: 1. Small pleural effusions suspected but no other acute cardiopulmonary abnormality. 2. Chronic left diaphragmatic hernia and pulmonary interstitial changes. Emphysema (ICD10-J43.9). Electronically Signed   By: Odessa Fleming M.D.   On: 03/24/2021 08:50   DG Chest Port 1 View  Result Date: 03/20/2021 CLINICAL DATA:  Altered mental status. EXAM: PORTABLE CHEST 1 VIEW COMPARISON:  Jan 29, 2017 FINDINGS: Mild to moderate severity diffusely increased interstitial lung markings are seen. Mild atelectasis and/or infiltrate is noted within the bilateral lung bases. There is no evidence of a pleural effusion or pneumothorax. The cardiac silhouette is mildly enlarged. Marked severity calcification of the aortic arch is seen. There is a large hiatal hernia. The visualized skeletal structures are unremarkable. IMPRESSION: 1. Cardiomegaly and diffusely increased interstitial lung markings  which are likely, in part, chronic in nature. A superimposed component of interstitial edema cannot be excluded. 2. Mild bibasilar atelectasis and/or infiltrate. 3. Large hiatal. Electronically Signed   By: Aram Candela M.D.   On: 03/20/2021 16:47   ECHOCARDIOGRAM COMPLETE  Result Date: 03/22/2021    ECHOCARDIOGRAM REPORT   Patient Name:   SARAH BAEZ Dinan Date of Exam: 03/22/2021 Medical Rec #:  [id number]         Height:       63.0 in Accession #:    [id number]        Weight:       120.0 lb Date of Birth:  [birth year]        BSA:          1.556 m Patient Age:    88 years          BP:           170/94 mmHg Patient Gender: F                 HR:           84 bpm. Exam Location:  Inpatient Procedure: 2D Echo, Cardiac Doppler and Color Doppler Indications:    CHF  History:        Patient has prior history of Echocardiogram examinations, most                 recent 08/27/2008. CHF, CAD, Arrythmias:Atrial Fibrillation,                 Signs/Symptoms:Shortness of Breath and Pneumonia, sepsis; Risk                 Factors:Hypertension and Dyslipidemia.  Sonographer:    Lavenia Atlas Referring Phys: [id number] RAVI PAHWANI  Sonographer Comments: Image acquisition challenging due  to respiratory motion. IMPRESSIONS  1. Left ventricular ejection fraction, by estimation, is 60 to 65%. The left ventricle has normal function. The left ventricle has no regional wall motion abnormalities. There is mild concentric left ventricular hypertrophy. Left ventricular diastolic parameters are indeterminate. Elevated left atrial pressure.  2. Right ventricular systolic function was not well visualized, but is likely low normal. The right ventricular size is normal. There is moderately elevated pulmonary artery systolic pressure.  3. Left atrial size was severely dilated.  4. Right atrial size was mildly dilated.  5. The mitral valve is normal in structure. No evidence of mitral valve regurgitation.  6. Tricuspid valve regurgitation  is mild to moderate; valve morphology is not well visualized but a mechanism of valve prolapse is suggestive by Spectral Doppler signal.  7. The aortic valve is tricuspid. Aortic valve regurgitation is mild. Aortic regurgitation PHT measures 694 msec. Comparison(s): A prior study was performed on 06/28/2004. Prior images reviewed side by side. Similar study, aortic regurgitation is new from prior. FINDINGS  Left Ventricle: Left ventricular ejection fraction, by estimation, is 60 to 65%. The left ventricle has normal function. The left ventricle has no regional wall motion abnormalities. The left ventricular internal cavity size was small. There is mild concentric left ventricular hypertrophy. Left ventricular diastolic parameters are indeterminate. Elevated left atrial pressure. Right Ventricle: The right ventricular size is normal. No increase in right ventricular wall thickness. Right ventricular systolic function was not well visualized. There is moderately elevated pulmonary artery systolic pressure. The tricuspid regurgitant velocity is 3.71 m/s, and with an assumed right atrial pressure of 3 mmHg, the estimated right ventricular systolic pressure is 58.1 mmHg. Left Atrium: Left atrial size was severely dilated. Right Atrium: Right atrial size was mildly dilated. Pericardium: There is no evidence of pericardial effusion. Mitral Valve: The mitral valve is normal in structure. No evidence of mitral valve regurgitation. MV peak gradient, 9.1 mmHg. The mean mitral valve gradient is 3.0 mmHg. Tricuspid Valve: The tricuspid valve is not well visualized. Tricuspid valve regurgitation is mild to moderate. Aortic Valve: The aortic valve is tricuspid. Aortic valve regurgitation is mild. Aortic regurgitation PHT measures 694 msec. Pulmonic Valve: The pulmonic valve was grossly normal. Pulmonic valve regurgitation is trivial. No evidence of pulmonic stenosis. Aorta: The aortic root is normal in size and structure.  IAS/Shunts: The atrial septum is grossly normal.  LEFT VENTRICLE PLAX 2D LVIDd:         2.70 cm  Diastology LVIDs:         1.70 cm  LV e' medial:    5.11 cm/s LV PW:         1.10 cm  LV E/e' medial:  21.9 LV IVS:        1.10 cm  LV e' lateral:   4.24 cm/s LVOT diam:     2.10 cm  LV E/e' lateral: 26.4 LV SV:         44 LV SV Index:   28 LVOT Area:     3.46 cm  RIGHT VENTRICLE RV Basal diam:  3.10 cm RV S prime:     8.81 cm/s TAPSE (M-mode): 0.8 cm LEFT ATRIUM             Index       RIGHT ATRIUM           Index LA diam:        2.70 cm 1.73 cm/m  RA Area:     19.70 cm  LA Vol (A2C):   64.5 ml 41.44 ml/m RA Volume:   54.00 ml  34.70 ml/m LA Vol (A4C):   85.1 ml 54.68 ml/m LA Biplane Vol: 74.0 ml 47.55 ml/m  AORTIC VALVE LVOT Vmax:   71.70 cm/s LVOT Vmean:  44.400 cm/s LVOT VTI:    0.126 m AI PHT:      694 msec  AORTA Ao Root diam: 3.30 cm MITRAL VALVE                TRICUSPID VALVE MV Area (PHT): 12.24 cm    TR Peak grad:   55.1 mmHg MV Area VTI:   2.00 cm     TR Vmax:        371.00 cm/s MV Peak grad:  9.1 mmHg MV Mean grad:  3.0 mmHg     SHUNTS MV Vmax:       1.51 m/s     Systemic VTI:  0.13 m MV Vmean:      80.1 cm/s    Systemic Diam: 2.10 cm MV Decel Time: 62 msec MV E velocity: 112.00 cm/s MV A velocity: 88.30 cm/s MV E/A ratio:  1.27 Riley Lam MD Electronically signed by Riley Lam MD Signature Date/Time: 03/22/2021/3:30:00 PM    Final    VAS [place] LOWER EXTREMITY VENOUS (DVT) (ONLY [facility] & [facility])  Result Date: 03/21/2021  Lower Venous DVT Study Patient Name:  ALAYJA ARMAS  Date of Exam:   03/20/2021 Medical Rec #: [id number]          Accession #:    [id number] Date of Birth: Aug 06, 1932         Patient Gender: F Patient Age:   88Y Exam Location:  [facility] Procedure:      VAS [place] LOWER EXTREMITY VENOUS (DVT) Referring Phys: [id number] Rose Phi BADALAMENTE --------------------------------------------------------------------------------  Indications: Swelling.  Comparison Study:  Prior negative Right LEV done 01/05/21 Performing Technologist: Sherren Kerns RVS  Examination Guidelines: A complete evaluation includes B-mode imaging, spectral Doppler, color Doppler, and power Doppler as needed of all accessible portions of each vessel. Bilateral testing is considered an integral part of a complete examination. Limited examinations for reoccurring indications may be performed as noted. The reflux portion of the exam is performed with the patient in reverse Trendelenburg.  +---------+---------------+---------+-----------+----------+--------------+ RIGHT    CompressibilityPhasicitySpontaneityPropertiesThrombus Aging +---------+---------------+---------+-----------+----------+--------------+ CFV      Full           Yes      Yes                                 +---------+---------------+---------+-----------+----------+--------------+ SFJ      Full                                                        +---------+---------------+---------+-----------+----------+--------------+ FV Prox  Full                                                        +---------+---------------+---------+-----------+----------+--------------+ FV Mid   Full                                                        +---------+---------------+---------+-----------+----------+--------------+  FV DistalFull                                                        +---------+---------------+---------+-----------+----------+--------------+ PFV      Full                                                        +---------+---------------+---------+-----------+----------+--------------+ POP      Full           Yes      Yes                                 +---------+---------------+---------+-----------+----------+--------------+ PTV      Full                                                        +---------+---------------+---------+-----------+----------+--------------+ PERO      Full                                                        +---------+---------------+---------+-----------+----------+--------------+   +----+---------------+---------+-----------+----------+--------------+ LEFTCompressibilityPhasicitySpontaneityPropertiesThrombus Aging +----+---------------+---------+-----------+----------+--------------+ CFV Full           Yes      Yes                                 +----+---------------+---------+-----------+----------+--------------+     Summary: RIGHT: - Findings appear essentially unchanged compared to previous examination. - There is no evidence of deep vein thrombosis in the lower extremity.  LEFT: - No evidence of common femoral vein obstruction.  *See table(s) above for measurements and observations. Electronically signed by Lemar Livings MD on 03/21/2021 at 12:25:40 PM.    Final    US Abdomen Limited RUQ (LIVER/GB)  Result Date: 03/20/2021 CLINICAL DATA:  Cholelithiasis. EXAM: ULTRASOUND ABDOMEN LIMITED RIGHT UPPER QUADRANT COMPARISON:  None. FINDINGS: Gallbladder: A 1.4 cm lobulated echogenic area is seen within the dependent portion of the gallbladder lumen. A mild amount of surrounding heterogeneous gallbladder sludge is also noted. The gallbladder wall measures 2.6 mm in thickness. No sonographic Murphy sign noted by sonographer. Common bile duct: Diameter: 3.6 mm Liver: No focal lesion identified. Mild, diffusely increased echogenicity of the liver parenchyma is noted. Portal vein is patent on color Doppler imaging with normal direction of blood flow towards the liver. Other: Of incidental note is the presence of a small right-sided pleural effusion. IMPRESSION: 1. Gallbladder sludge and suspected cholelithiasis, without acute cholecystitis. 2. Fatty liver. Electronically Signed   By: Aram Candela M.D.   On: 03/20/2021 20:19    Lab Data:  CBC: Recent Labs  Lab 03/23/21 0507 03/24/21 0538  WBC 10.2 8.7  NEUTROABS 8.2* 6.6   HGB 13.5 14.7  HCT 43.7 46.2*  MCV 97.8 95.9  PLT 149* 185   Basic Metabolic Panel: Recent Labs  Lab 03/23/21 0507 03/24/21 0538 03/25/21 0543 03/25/21 1651 03/26/21 0456  NA 139 139 136 135 135  K 3.2* 4.1 3.3* 5.1 4.9  CL 101 101 90* 91* 92*  CO2 27 26 33* 28 30  GLUCOSE 80 81 106* 116* 98  BUN 25*  CREATININE 0.64 0.58 0.70 0.91 0.82  CALCIUM 8.4* 9.2 9.7 10.1 9.7  MG  --   --   --   --  1.7   GFR: Estimated Creatinine Clearance: 40.7 mL/min (by C-G formula based on SCr of 0.82 mg/dL). Liver Function Tests: No results for input(s): AST, ALT, ALKPHOS, BILITOT, PROT, ALBUMIN in the last 168 hours. No results for input(s): LIPASE, AMYLASE in the last 168 hours. No results for input(s): AMMONIA in the last 168 hours. Coagulation Profile: No results for input(s): INR, PROTIME in the last 168 hours. Cardiac Enzymes: No results for input(s): CKTOTAL, CKMB, CKMBINDEX, TROPONINI in the last 168 hours. BNP (last 3 results) No results for input(s): PROBNP in the last 8760 hours. HbA1C: No results for input(s): HGBA1C in the last 72 hours. CBG: Recent Labs  Lab 03/25/21 1207  GLUCAP 150*   Lipid Profile: No results for input(s): CHOL, HDL, LDLCALC, TRIG, CHOLHDL, LDLDIRECT in the last 72 hours. Thyroid Function Tests: No results for input(s): TSH, T4TOTAL, FREET4, T3FREE, THYROIDAB in the last 72 hours. Anemia Panel: No results for input(s): VITAMINB12, FOLATE, FERRITIN, TIBC, IRON, RETICCTPCT in the last 72 hours. Urine analysis:    Component Value Date/Time   COLORURINE AMBER (A) 03/20/2021 1538   APPEARANCEUR HAZY (A) 03/20/2021 1538   LABSPEC 1.017 03/20/2021 1538   PHURINE 7.0 03/20/2021 1538   GLUCOSEU NEGATIVE 03/20/2021 1538   HGBUR NEGATIVE 03/20/2021 1538   BILIRUBINUR NEGATIVE 03/20/2021 1538   KETONESUR NEGATIVE 03/20/2021 1538   PROTEINUR NEGATIVE 03/20/2021 1538   NITRITE NEGATIVE 03/20/2021 1538   LEUKOCYTESUR NEGATIVE 03/20/2021 1538      Tabitha Tupper M.D. [facility] 03/29/2021, 9:44 AM  Available via Epic secure chat 7am-7pm After 7 pm, please refer to night coverage provider listed on amion.

## 2021-03-29 NOTE — Progress Notes (Signed)
[facility] just arrived to pick up patient, but were unable to transport due to a 9 pm admission cut off rule at [facility] facility. [facility] was called and I spoke with a staff member that verified this rule.  [facility] will be called in am when they are opened to get patient on the list again. We will specify to [facility] that there is a cutoff time and that patient waited all day Sunday for pickup. Patient's daughter was called just now and updated. Will pass this along to day shift.Priscille Kluver

## 2021-05-16 ENCOUNTER — Other Ambulatory Visit: Payer: Self-pay | Admitting: Internal Medicine

## 2021-06-24 ENCOUNTER — Emergency Department (HOSPITAL_BASED_OUTPATIENT_CLINIC_OR_DEPARTMENT_OTHER): Payer: Medicare Other

## 2021-06-24 ENCOUNTER — Emergency Department (HOSPITAL_BASED_OUTPATIENT_CLINIC_OR_DEPARTMENT_OTHER): Payer: Medicare Other | Admitting: Radiology

## 2021-06-24 ENCOUNTER — Other Ambulatory Visit: Payer: Self-pay

## 2021-06-24 ENCOUNTER — Encounter (HOSPITAL_BASED_OUTPATIENT_CLINIC_OR_DEPARTMENT_OTHER): Payer: Self-pay | Admitting: *Deleted

## 2021-06-24 ENCOUNTER — Emergency Department (HOSPITAL_BASED_OUTPATIENT_CLINIC_OR_DEPARTMENT_OTHER)
Admission: EM | Admit: 2021-06-24 | Discharge: 2021-06-24 | Disposition: A | Discharge status: Home / Self Care | Payer: Medicare Other | Attending: Emergency Medicine | Admitting: Emergency Medicine

## 2021-06-24 DIAGNOSIS — N211 Calculus in urethra: Secondary | ICD-10-CM | POA: Insufficient documentation

## 2021-06-24 DIAGNOSIS — N3001 Acute cystitis with hematuria: Secondary | ICD-10-CM | POA: Diagnosis not present

## 2021-06-24 DIAGNOSIS — Z79899 Other long term (current) drug therapy: Secondary | ICD-10-CM | POA: Insufficient documentation

## 2021-06-24 DIAGNOSIS — Z87891 Personal history of nicotine dependence: Secondary | ICD-10-CM | POA: Diagnosis not present

## 2021-06-24 DIAGNOSIS — R300 Dysuria: Secondary | ICD-10-CM | POA: Diagnosis present

## 2021-06-24 DIAGNOSIS — I10 Essential (primary) hypertension: Secondary | ICD-10-CM | POA: Diagnosis not present

## 2021-06-24 DIAGNOSIS — E876 Hypokalemia: Secondary | ICD-10-CM | POA: Insufficient documentation

## 2021-06-24 DIAGNOSIS — Z7901 Long term (current) use of anticoagulants: Secondary | ICD-10-CM | POA: Diagnosis not present

## 2021-06-24 DIAGNOSIS — B965 Pseudomonas (aeruginosa) (mallei) (pseudomallei) as the cause of diseases classified elsewhere: Secondary | ICD-10-CM | POA: Insufficient documentation

## 2021-06-24 DIAGNOSIS — Z20822 Contact with and (suspected) exposure to covid-19: Secondary | ICD-10-CM | POA: Diagnosis not present

## 2021-06-24 LAB — CBC WITH DIFFERENTIAL/PLATELET
Abs Immature Granulocytes: 0.02 10*3/uL (ref 0.00–0.07)
Basophils Absolute: 0.1 10*3/uL (ref 0.0–0.1)
Basophils Relative: 1 %
Eosinophils Absolute: 0.1 10*3/uL (ref 0.0–0.5)
Eosinophils Relative: 1 %
HCT: 43.9 % (ref 36.0–46.0)
Hemoglobin: 14.4 g/dL (ref 12.0–15.0)
Immature Granulocytes: 0 %
Lymphocytes Relative: 14 %
Lymphs Abs: 1.3 10*3/uL (ref 0.7–4.0)
MCH: 30.8 pg (ref 26.0–34.0)
MCHC: 32.8 g/dL (ref 30.0–36.0)
MCV: 94 fL (ref 80.0–100.0)
Monocytes Absolute: 0.8 10*3/uL (ref 0.1–1.0)
Monocytes Relative: 8 %
Neutro Abs: 7.2 10*3/uL (ref 1.7–7.7)
Neutrophils Relative %: 76 %
Platelets: 225 10*3/uL (ref 150–400)
RBC: 4.67 MIL/uL (ref 3.87–5.11)
RDW: 14.8 % (ref 11.5–15.5)
WBC: 9.5 10*3/uL (ref 4.0–10.5)
nRBC: 0 % (ref 0.0–0.2)

## 2021-06-24 LAB — URINALYSIS, ROUTINE W REFLEX MICROSCOPIC
Bilirubin Urine: NEGATIVE
Glucose, UA: NEGATIVE mg/dL
Ketones, ur: NEGATIVE mg/dL
Nitrite: POSITIVE — AB
Protein, ur: >300 mg/dL — AB
RBC / HPF: >50 RBC/hpf — ABNORMAL HIGH (ref 0–5)
Specific Gravity, Urine: 1.013 (ref 1.005–1.030)
WBC, UA: >50 WBC/hpf — ABNORMAL HIGH (ref 0–5)
pH: 6 (ref 5.0–8.0)

## 2021-06-24 LAB — COMPREHENSIVE METABOLIC PANEL
ALT: 6 U/L (ref 0–44)
AST: 15 U/L (ref 15–41)
Albumin: 4.2 g/dL (ref 3.5–5.0)
Alkaline Phosphatase: 74 U/L (ref 38–126)
Anion gap: 11 (ref 5–15)
BUN: 12 mg/dL (ref 8–23)
CO2: 28 mmol/L (ref 22–32)
Calcium: 9.3 mg/dL (ref 8.9–10.3)
Chloride: 102 mmol/L (ref 98–111)
Creatinine, Ser: 0.68 mg/dL (ref 0.44–1.00)
GFR, Estimated: >60 mL/min (ref >60)
Glucose, Bld: 106 mg/dL — ABNORMAL HIGH (ref 70–99)
Potassium: 3.1 mmol/L — ABNORMAL LOW (ref 3.5–5.1)
Sodium: 141 mmol/L (ref 135–145)
Total Bilirubin: 0.9 mg/dL (ref 0.3–1.2)
Total Protein: 7.4 g/dL (ref 6.5–8.1)

## 2021-06-24 LAB — RESP PANEL BY RT-PCR (FLU A&B, COVID) ARPGX2
Influenza A by PCR: NEGATIVE
Influenza B by PCR: NEGATIVE
SARS Coronavirus 2 by RT PCR: NEGATIVE

## 2021-06-24 MED ORDER — ALBUTEROL SULFATE HFA 108 (90 BASE) MCG/ACT IN AERS
2.0000 | INHALATION_SPRAY | Freq: Once | RESPIRATORY_TRACT | Status: DC
  Filled 2021-06-24 (×2): qty 6.7

## 2021-06-24 MED ORDER — CEPHALEXIN 500 MG PO CAPS: 500.0000 mg | ORAL_CAPSULE | Freq: Four times a day (QID) | ORAL | 0 refills | Status: AC

## 2021-06-24 MED ORDER — POTASSIUM CHLORIDE CRYS ER 20 MEQ PO TBCR
40.0000 meq | EXTENDED_RELEASE_TABLET | Freq: Once | ORAL | Status: AC
  Administered 2021-06-24: 40 meq via ORAL
  Filled 2021-06-24: qty 2

## 2021-06-24 MED ORDER — SODIUM CHLORIDE 0.9 % IV SOLN
1.0000 g | Freq: Once | INTRAVENOUS | Status: AC
  Administered 2021-06-24: 1 g via INTRAVENOUS
  Filled 2021-06-24: qty 10

## 2021-06-24 MED ORDER — IOHEXOL 350 MG/ML SOLN
50.0000 mL | Freq: Once | INTRAVENOUS | Status: AC | PRN
  Administered 2021-06-24: 50 mL via INTRAVENOUS

## 2021-06-24 NOTE — ED Notes (Signed)
Patient transported to CT 

## 2021-06-24 NOTE — Discharge Instructions (Addendum)
You were given a prescription for antibiotics. Please take the antibiotic prescription fully.   A culture was sent of your urine today to determine if there is any bacterial growth. If the results of the culture are positive and you require an antibiotic or a change of your prescribed antibiotic you will be contacted by the hospital. If the results are negative you will not be contacted.  Please follow up with your PCP and with urology in regards to your symptoms.   Return to the ED for new or worsening symptoms.

## 2021-06-24 NOTE — ED Provider Notes (Signed)
Patient seen by me along with the physician assistant  I provided a substantive portion of the care of this patient.  I personally performed the entirety of the history, exam, and medical decision making for this encounter.      Patient followed by Dr. Sherrye Payor.  Patient apparently has been struggling with multiple recurrent urinary tract infections.  Unfortunately we do not have in our system any of the cultures.  Patient also not clear on what antibiotic she is currently taking.  It is a twice daily type antibiotic which would make it most likely to be either Bactrim Macrobid or perhaps Cipro type antibiotic.  Patient nontoxic no acute distress.  No nausea no vomiting.  COVID-negative no leukocytosis electrolytes other than a mild hypokalemia with a potassium of 3.1 without any significant abnormalities.  Renal functions normal.  GFR is greater than 60.  Urinalysis does seem to be consistent with urinary tract infection.  But only few bacteria.  Urine culture has been sent and this will help clarify.  Patient received 1 g of Rocephin here and will probably be discharged home on Keflex.  CT scan was done which showed a lot of bladder inflammation.  Also showed a urethral Vuolo.  The patient is urinating.  Discussed with urology they said they would follow her up as an outpatient no indication for admission.  Patient will return for any new or worse symptoms.  Also possible patient could have interstitial cystitis.  But without knowledge of past cultures it is really hard to make a comment about that.  Certainly today's urine without a lot of bacteria.  And patient is currently still on that oral antibiotic whichever 1 that is.  Patient nontoxic no acute distress.   Vanetta Mulders, MD 06/24/21 [phone]

## 2021-06-24 NOTE — ED Triage Notes (Signed)
Was seen by her Primary Care MD and was told she had an UTI, cont to having a lot of burning upon urination. Denies having any fevers. Has noted streaks of blood in her urine as well.

## 2021-06-24 NOTE — ED Provider Notes (Signed)
[facility] EMERGENCY DEPT Provider Note   CSN: [id number] Arrival date & time: 06/24/21  1042     History Chief Complaint  Patient presents with   Urinary Frequency    Laurie Pugh is a 87 y.o. female.  HPI  87 year old female with a history of allergies, angina pectoris, A. fib, dyslipidemia, dysrhythmia, esophageal stricture, GERD, hyperlipidemia, hypertension, SVT, who presents to the emergency department today for evaluation of dysuria.  Patient has had dysuria, urinary frequency and hematuria for the last 2 months.  She is been seen by her PCP and states that she has been on 3 different antibiotics and 2 medications for a yeast infection but she continues to have dysuria.  She states that hematuria has improved.  She is had no fevers nausea or vomiting.  She has had some abdominal pain and back pain with this illness.  She called her PCP about her symptoms and was sent here for further evaluation.  Past Medical History:  Diagnosis Date   Allergy    Angina pectoris, unstable (HCC)    Atrial fibrillation (HCC)    Cataract    Dyslipidemia    Dysrhythmia    Esophageal stricture    GERD (gastroesophageal reflux disease)    Hyperlipidemia    Hypertension    Neck pain    PAF (paroxysmal atrial fibrillation) (HCC)    PSVT (paroxysmal supraventricular tachycardia) (HCC)     Patient Active Problem List   Diagnosis Date Noted   Pressure injury of skin 03/26/2021   Pneumonia 03/20/2021   Stenosis of carotid artery 02/12/2018   Subdural hematoma (HCC) 05/21/2017   Chronic anticoagulation 05/21/2017   Esophageal stricture 01/30/2017   Esophageal dysphagia    Esophageal obstruction due to food impaction    Sinus bradycardia 05/26/2014   PVC's (premature ventricular contractions) 05/26/2014   Paroxysmal SVT (supraventricular tachycardia) (HCC) 07/16/2013   Atrial fibrillation (HCC) 06/24/2013   Mixed hyperlipidemia 06/24/2013   Essential hypertension  06/24/2013   Angina pectoris, unstable (HCC) 06/24/2013   Neck pain 06/24/2013   DOE (dyspnea on exertion) 06/24/2013    Past Surgical History:  Procedure Laterality Date   ABDOMINAL HYSTERECTOMY     APPENDECTOMY     BLADDER SURGERY     bladder surgery   BREAST EXCISIONAL BIOPSY     CARDIAC CATHETERIZATION  06/28/2004   no significant CAD (Dr. Laurell Josephs)   CATARACT EXTRACTION, BILATERAL  2008   CYSTOSCOPY W/ RETROGRADES Bilateral 04/11/2016   Procedure: CYSTOSCOPY WITH BILATERAL RETROGRADE PYELOGRAM;  Surgeon: Hildred Laser, MD;  Location: [facility];  Service: Urology;  Laterality: Bilateral;   ESOPHAGOGASTRODUODENOSCOPY N/A 01/30/2017   Procedure: ESOPHAGOGASTRODUODENOSCOPY (EGD);  Surgeon: Hilarie Fredrickson, MD;  Location: [facility];  Service: Endoscopy;  Laterality: N/A;   ESOPHAGOGASTRODUODENOSCOPY N/A 01/30/2017   Procedure: ESOPHAGOGASTRODUODENOSCOPY (EGD);  Surgeon: Napoleon Form, MD;  Location: [facility];  Service: Endoscopy;  Laterality: N/A;   IR VERTEBROPLASTY LUMBAR BX INC UNI/BIL INC/INJECT/IMAGING  03/15/2019   TRANSTHORACIC ECHOCARDIOGRAM  2009   borderline conc LVH; trace MR; mild-mod TR, RVSP 30-79mmHg     OB History   No obstetric history on file.     Family History  Problem Relation Age of Onset   Heart disease Mother    Heart disease Sister    Suicidality Brother    Cancer Sister    COPD Sister    Suicidality Child    Colon cancer Neg Hx    Colon polyps Neg Hx  Esophageal cancer Neg Hx    Rectal cancer Neg Hx    Stomach cancer Neg Hx     Social History   Tobacco Use   Smoking status: Former    Packs/day: 1.00    Years: 40.00    Pack years: 40.00    Types: Cigarettes    Quit date: 06/25/2003    Years since quitting: 18.0   Smokeless tobacco: Never  Vaping Use   Vaping Use: Never used  Substance Use Topics   Alcohol use: No   Drug use: No    Home Medications Prior to Admission medications   Medication Sig Start Date End Date  Taking? Authorizing Provider  apixaban (ELIQUIS) 5 MG TABS tablet Take 2.5 mg by mouth 2 (two) times daily.   Yes [provider]  atenolol (TENORMIN) 25 MG tablet TAKE 1 TABLET BY MOUTH  DAILY 05/18/21  Yes Hilty, Lisette Abu, MD  benazepril (LOTENSIN) 20 MG tablet Take 20 mg by mouth daily.  05/30/13  Yes [provider]  cephALEXin (KEFLEX) 500 MG capsule Take 1 capsule (500 mg total) by mouth 4 (four) times daily for 7 days. 06/24/21 07/01/21 Yes Kemoni Ortega S, PA-C  chlorthalidone (HYGROTON) 25 MG tablet Take 12.5-25 mg by mouth daily as needed (for SBP >140).   Yes [provider]  loratadine (CLARITIN) 10 MG tablet Take 10 mg by mouth daily.   Yes [provider]  LORazepam (ATIVAN) 1 MG tablet Take 0.5-1 mg by mouth 2 (two) times daily as needed for anxiety.  05/30/13  Yes [provider]  omeprazole (PRILOSEC) 40 MG capsule TAKE 1 CAPSULE BY MOUTH  DAILY 01/14/21  Yes Unk Lightning, PA  oxyCODONE-acetaminophen (PERCOCET/ROXICET) 5-325 MG tablet Take 1 tablet by mouth every 6 (six) hours as needed (pain).  02/06/19  Yes [provider]  polyvinyl alcohol (LIQUIFILM TEARS) 1.4 % ophthalmic solution Place 1 drop into both eyes as needed for dry eyes. 03/28/21  Yes Swayze, Ava, DO  Probiotic Product (PHILLIPS COLON HEALTH) CAPS Take 1 capsule by mouth daily.   Yes [provider]  rosuvastatin (CRESTOR) 10 MG tablet Take 10 mg by mouth daily.   Yes [provider]    Allergies    Codeine and Erythromycin  Review of Systems   Review of Systems  Constitutional:  Negative for chills and fever.  HENT:  Negative for ear pain and sore throat.   Eyes:  Negative for visual disturbance.  Respiratory:  Negative for cough and shortness of breath.   Cardiovascular:  Negative for chest pain.  Gastrointestinal:  Positive for abdominal pain. Negative for constipation, diarrhea, nausea and vomiting.  Genitourinary:  Negative for  dysuria and hematuria.  Musculoskeletal:  Positive for back pain.  Skin:  Negative for rash.  Neurological:  Negative for seizures and syncope.  All other systems reviewed and are negative.  Physical Exam Updated Vital Signs BP (!) 159/93 (BP Location: Right Arm)   Pulse 81   Temp 97.7 F (36.5 C)   Resp 18   Ht 5\' 3"  (1.6 m)   Wt 45.8 kg   SpO2 93%   BMI 17.89 kg/m   Physical Exam Vitals and nursing note reviewed.  Constitutional:      General: She is not in acute distress.    Appearance: She is well-developed.  HENT:     Head: Normocephalic and atraumatic.  Eyes:     Conjunctiva/sclera: Conjunctivae normal.  Cardiovascular:     Rate  and Rhythm: Normal rate and regular rhythm.     Heart sounds: No murmur heard. Pulmonary:     Effort: Pulmonary effort is normal. No respiratory distress.     Breath sounds: Rales (bibasilar rales) present.  Abdominal:     General: Bowel sounds are normal.     Palpations: Abdomen is soft.     Tenderness: There is no abdominal tenderness. There is no guarding or rebound.  Musculoskeletal:     Cervical back: Neck supple.     Right lower leg: No edema.     Left lower leg: No edema.  Skin:    General: Skin is warm and dry.  Neurological:     Mental Status: She is alert.    ED Results / Procedures / Treatments   Labs (all labs ordered are listed, but only abnormal results are displayed) Labs Reviewed  URINALYSIS, ROUTINE W REFLEX MICROSCOPIC - Abnormal; Notable for the following components:      Result Value   APPearance CLOUDY (*)    Hgb urine dipstick LARGE (*)    Protein, ur >300 (*)    Nitrite POSITIVE (*)    Leukocytes,Ua LARGE (*)    RBC / HPF >50 (*)    WBC, UA >50 (*)    Bacteria, UA FEW (*)    All other components within normal limits  COMPREHENSIVE METABOLIC PANEL - Abnormal; Notable for the following components:   Potassium 3.1 (*)    Glucose, Bld 106 (*)    All other components within normal limits  RESP PANEL BY  RT-PCR (FLU A&B, COVID) ARPGX2  URINE CULTURE  CBC WITH DIFFERENTIAL/PLATELET    EKG None  Radiology DG Chest 2 View  Result Date: 06/24/2021 CLINICAL DATA:  Shortness of breath EXAM: CHEST - 2 VIEW COMPARISON:  03/25/2021 FINDINGS: Hyperinflation with emphysema. No focal opacity or pleural effusion. Diffuse bronchitic changes. Moderate large hiatal hernia. Cardiomegaly with aortic atherosclerosis. No pneumothorax IMPRESSION: 1. Emphysema with bronchitic changes. 2. Cardiomegaly. 3. Hiatal hernia Electronically Signed   By: Jasmine Pang M.D.   On: 06/24/2021 17:18   CT ABDOMEN PELVIS W CONTRAST  Addendum Date: 06/24/2021   ADDENDUM REPORT: 06/24/2021 17:32 ADDENDUM: Additional impression, grossly stable fat containing mass in the presacral space with differential considerations as previously described Electronically Signed   By: Jasmine Pang M.D.   On: 06/24/2021 17:32   Result Date: 06/24/2021 CLINICAL DATA:  Lower abdominal pain EXAM: CT ABDOMEN AND PELVIS WITH CONTRAST TECHNIQUE: Multidetector CT imaging of the abdomen and pelvis was performed using the standard protocol following bolus administration of intravenous contrast. CONTRAST:  10mL OMNIPAQUE IOHEXOL 350 MG/ML SOLN COMPARISON:  CT 03/20/2021 07/01/2019 FINDINGS: Lower chest: Lung bases demonstrate emphysema. No acute consolidation or effusion. Biatrial enlargement. Coronary vascular calcification. Moderate large hiatal hernia Hepatobiliary: Heterogenous liver enhancement possibly due to passive congestion. Gallstones. No biliary dilatation Pancreas: Unremarkable. No pancreatic ductal dilatation or surrounding inflammatory changes. Spleen: Normal in size without focal abnormality. Adrenals/Urinary Tract: Adrenal glands are normal. Cyst at the mid left kidney. Small cysts at the mid lower right kidney. No hydronephrosis. Thick-walled urinary bladder with mucosal enhancement suspicious for cystitis. Punctate 1-2 mm calcification in the  region of the urethra. Stomach/Bowel: The stomach is nonenlarged. No dilated small bowel. No acute bowel wall thickening. Vascular/Lymphatic: Advanced aortic atherosclerosis. No aneurysm. No suspicious nodes Reproductive: Status post hysterectomy. No adnexal masses. Other: Negative for pelvic effusion or free air. 3.7 x 3.3 cm fat containing presacral mass, previously 3.1  x 3.6 cm. Musculoskeletal: Chronic compression fracture at L1. Multilevel degenerative changes IMPRESSION: 1. Thick-walled urinary bladder with mucosal enhancement suspicious for cystitis. 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 2. Cardiomegaly with biatrial enlargement. Heterogenous liver enhancement could be due to passive congestion 3. Gallstones 4. Moderate large hiatal hernia Electronically Signed: By: Jasmine Pang M.D. On: 06/24/2021 17:17    Procedures Procedures   Medications Ordered in ED Medications  albuterol (VENTOLIN HFA) 108 (90 Base) MCG/ACT inhaler 2 puff (2 puffs Inhalation Patient Refused/Not Given 06/24/21 1804)  potassium chloride SA (KLOR-CON) CR tablet 40 mEq (has no administration in time range)  cefTRIAXone (ROCEPHIN) 1 g in sodium chloride 0.9 % 100 mL IVPB (0 g Intravenous Stopped 06/24/21 1726)  iohexol (OMNIPAQUE) 350 MG/ML injection 50 mL (50 mLs Intravenous Contrast Given 06/24/21 1630)    ED Course  I have reviewed the triage vital signs and the nursing notes.  Pertinent labs & imaging results that were available during my care of the patient were reviewed by me and considered in my medical decision making (see chart for details).    MDM Rules/Calculators/A&P                          87 y/o F presents for eval of dysuria and frequency x2 months.   Reviewed/interpreted labs CBC w/o leukocytosis, no anemia CMP with mild hypokalemia, otherwise reassuring Covid neg UA with nitrites and leukocytes consistent with UTI. Urine culture sent, ceftriaxone given   CT abdomen/pelvis reviewed/interpreted and  shows a thick-walled urinary bladder with mucosal enhancement suspicious for cystitis.  She also has a urethral Fite.  Cardiomegaly with heterogeneous liver enhancement likely passive congestion, gallstones and a hiatal hernia.  Discussed case with Dr. Cardell Peach with urology who does not feel that any emergent intervention for the urethral Beaulac needs to be completed at this time but that the patient can follow-up as an outpatient.  She should be able to pass a Diebel on her own  Reassessed patient.  She is doing well after dose of ceftriaxone.  Discussed possibility of admission versus discharge.  Patient would like to be discharged home on antibiotics.  I feel that this is reasonable given her very reassuring work-up, normal vital signs and that she is very well-appearing on exam.  I am unable to see any culture results from her PCP office and she does not know which antibiotics she has been on recently.  Review of records only shows 1 urine culture in the past which had multiple species and it was suggested further recollection.  We will discharge her home on Keflex and advised that she follow-up closely with her PCP and return to ED for any new or worsening symptoms.  She voices understanding of the plan and reasons to return.  All questions answered.  Patient stable for discharge.  Pt seen in conjunction with Dr. Deretha Emory who personally evaluated the pt and is in agreement with the plan.    Final Clinical Impression(s) / ED Diagnoses Final diagnoses:  Acute cystitis with hematuria  Urethral Dubas    Rx / DC Orders ED Discharge Orders          Ordered    cephALEXin (KEFLEX) 500 MG capsule  4 times daily        06/24/21 [address] [place], PA-C 06/24/21 1850    Vanetta Mulders, MD 06/28/21 1328

## 2021-06-26 LAB — URINE CULTURE: Culture: >=100000 — AB

## 2021-06-27 ENCOUNTER — Other Ambulatory Visit: Payer: Self-pay | Admitting: Physician Assistant

## 2021-06-27 ENCOUNTER — Telehealth: Payer: Self-pay | Admitting: Emergency Medicine

## 2021-06-27 NOTE — Telephone Encounter (Signed)
Post ED Visit - Positive Culture Follow-up: Successful Patient Follow-Up  Culture assessed and recommendations reviewed by:  []  , Pharm.D. []  Enzo Bi, Pharm.D., BCPS AQ-ID []  , Pharm.D., BCPS []  Celedonio Miyamoto, Pharm.D., BCPS []  [place], Garvin Fila.D., BCPS, AAHIVP []  , Pharm.D., BCPS, AAHIVP []  Georgina Pillion, PharmD, BCPS []  , PharmD, BCPS []  [place], PharmD, BCPS [x]  [place], PharmD  Positive urine culture  []  Patient discharged without antimicrobial prescription and treatment is now indicated [x]  Organism is resistant to prescribed ED discharge antimicrobial []  Patient with positive blood cultures  Changes discussed with ED provider: , PA New antibiotic prescription Fosfomycin three gram for one dose  Called to Laurie Pugh ) Lysle Pearl  Contacted patient's daughter, date 06/27/2021, time 1820   06/27/2021, 6:23 PM

## 2021-06-27 NOTE — Progress Notes (Signed)
ED Antimicrobial Stewardship Positive Culture Follow Up   Laurie Pugh is an 87 y.o. female who presented to [facility] on 06/24/2021 with a chief complaint of  Chief Complaint  Patient presents with   Urinary Frequency    Recent Results (from the past 720 hour(s))  Urine Culture     Status: Abnormal   Collection Time: 06/24/21 11:30 AM   Specimen: Urine, Clean Catch  Result Value Ref Range Status   Specimen Description   Final    URINE, CLEAN CATCH Performed at [facility], [address], [place], [place] [address]    Special Requests   Final    NONE Performed at [facility], [address] [place], [place] [address]    Culture >=100,000 COLONIES/mL PSEUDOMONAS AERUGINOSA (A)  Final   Report Status 06/26/2021 FINAL  Final   Organism ID, Bacteria PSEUDOMONAS AERUGINOSA (A)  Final      Susceptibility   Pseudomonas aeruginosa - MIC*    CEFTAZIDIME 4 SENSITIVE Sensitive     CIPROFLOXACIN 0.5 SENSITIVE Sensitive     GENTAMICIN <=1 SENSITIVE Sensitive     IMIPENEM 2 SENSITIVE Sensitive     PIP/TAZO <=4 SENSITIVE Sensitive     CEFEPIME 2 SENSITIVE Sensitive     * >=100,000 COLONIES/mL PSEUDOMONAS AERUGINOSA  Resp Panel by RT-PCR (Flu A&B, Covid) Nasopharyngeal Swab     Status: None   Collection Time: 06/24/21  4:23 PM   Specimen: Nasopharyngeal Swab; Nasopharyngeal(NP) swabs in vial transport medium  Result Value Ref Range Status   SARS Coronavirus 2 by RT PCR NEGATIVE NEGATIVE Final    Comment: (NOTE) SARS-CoV-2 target nucleic acids are NOT DETECTED.  The SARS-CoV-2 RNA is generally detectable in upper respiratory specimens during the acute phase of infection. The lowest concentration of SARS-CoV-2 viral copies this assay can detect is 138 copies/mL. A negative result does not preclude SARS-Cov-2 infection and should not be used as the sole basis for treatment or other patient management decisions. A negative result may occur  with  improper specimen collection/handling, submission of specimen other than nasopharyngeal swab, presence of viral mutation(s) within the areas targeted by this assay, and inadequate number of viral copies(<138 copies/mL). A negative result must be combined with clinical observations, patient history, and epidemiological information. The expected result is Negative.  Fact Sheet for Patients:  BloggerCourse.com  Fact Sheet for Healthcare Providers:  SeriousBroker.it  This test is no t yet approved or cleared by the [place] FDA and  has been authorized for detection and/or diagnosis of SARS-CoV-2 by FDA under an Emergency Use Authorization (EUA). This EUA will remain  in effect (meaning this test can be used) for the duration of the COVID-19 declaration under Section 564(b)(1) of the Act, 21 U.S.C.section 360bbb-3(b)(1), unless the authorization is terminated  or revoked sooner.       Influenza A by PCR NEGATIVE NEGATIVE Final   Influenza B by PCR NEGATIVE NEGATIVE Final    Comment: (NOTE) The Xpert Xpress SARS-CoV-2/FLU/RSV plus assay is intended as an aid in the diagnosis of influenza from Nasopharyngeal swab specimens and should not be used as a sole basis for treatment. Nasal washings and aspirates are unacceptable for Xpert Xpress SARS-CoV-2/FLU/RSV testing.  Fact Sheet for Patients: BloggerCourse.com  Fact Sheet for Healthcare Providers: SeriousBroker.it  This test is not yet approved or cleared by the [place] FDA and has been authorized for detection and/or diagnosis of SARS-CoV-2 by FDA under an Emergency Use Authorization (EUA). This  EUA will remain in effect (meaning this test can be used) for the duration of the COVID-19 declaration under Section 564(b)(1) of the Act, 21 U.S.C. section 360bbb-3(b)(1), unless the authorization is terminated  or revoked.  Performed at [facility], [address], [place], [place] [address]     [x]  Treated with Keflex, organism resistant to prescribed antimicrobial   New antibiotic prescription: Fosfomycin  ED Provider: , PA-C   Laurie Pugh 06/27/2021, 1:11 PM Clinical Pharmacist Monday - Friday phone -  [phone] Saturday - Sunday phone - [phone]

## 2021-06-28 NOTE — Telephone Encounter (Signed)
Patient needs office visit.  

## 2021-06-29 ENCOUNTER — Other Ambulatory Visit: Payer: Self-pay | Admitting: Internal Medicine

## 2021-10-26 ENCOUNTER — Encounter (HOSPITAL_COMMUNITY): Payer: Self-pay | Admitting: Internal Medicine

## 2021-10-26 ENCOUNTER — Other Ambulatory Visit: Payer: Self-pay

## 2021-10-26 ENCOUNTER — Emergency Department (HOSPITAL_COMMUNITY): Payer: Medicare Other

## 2021-10-26 ENCOUNTER — Inpatient Hospital Stay (HOSPITAL_COMMUNITY)
Admission: EM | Admit: 2021-10-26 | Discharge: 2021-10-29 | DRG: 291 | Disposition: A | Discharge status: Home Health | Payer: Medicare Other | Attending: Family Medicine | Admitting: Family Medicine

## 2021-10-26 DIAGNOSIS — I4892 Unspecified atrial flutter: Secondary | ICD-10-CM | POA: Diagnosis present

## 2021-10-26 DIAGNOSIS — I272 Pulmonary hypertension, unspecified: Secondary | ICD-10-CM | POA: Diagnosis present

## 2021-10-26 DIAGNOSIS — Z881 Allergy status to other antibiotic agents status: Secondary | ICD-10-CM | POA: Diagnosis not present

## 2021-10-26 DIAGNOSIS — E785 Hyperlipidemia, unspecified: Secondary | ICD-10-CM | POA: Diagnosis present

## 2021-10-26 DIAGNOSIS — Z885 Allergy status to narcotic agent status: Secondary | ICD-10-CM

## 2021-10-26 DIAGNOSIS — I5031 Acute diastolic (congestive) heart failure: Secondary | ICD-10-CM | POA: Diagnosis not present

## 2021-10-26 DIAGNOSIS — Z7901 Long term (current) use of anticoagulants: Secondary | ICD-10-CM

## 2021-10-26 DIAGNOSIS — I5033 Acute on chronic diastolic (congestive) heart failure: Secondary | ICD-10-CM | POA: Diagnosis present

## 2021-10-26 DIAGNOSIS — E43 Unspecified severe protein-calorie malnutrition: Secondary | ICD-10-CM | POA: Diagnosis present

## 2021-10-26 DIAGNOSIS — Z681 Body mass index (BMI) 19 or less, adult: Secondary | ICD-10-CM

## 2021-10-26 DIAGNOSIS — R0602 Shortness of breath: Secondary | ICD-10-CM

## 2021-10-26 DIAGNOSIS — M549 Dorsalgia, unspecified: Secondary | ICD-10-CM | POA: Diagnosis present

## 2021-10-26 DIAGNOSIS — I110 Hypertensive heart disease with heart failure: Principal | ICD-10-CM | POA: Diagnosis present

## 2021-10-26 DIAGNOSIS — Z87891 Personal history of nicotine dependence: Secondary | ICD-10-CM

## 2021-10-26 DIAGNOSIS — E876 Hypokalemia: Secondary | ICD-10-CM | POA: Diagnosis present

## 2021-10-26 DIAGNOSIS — Z79899 Other long term (current) drug therapy: Secondary | ICD-10-CM

## 2021-10-26 DIAGNOSIS — J189 Pneumonia, unspecified organism: Secondary | ICD-10-CM | POA: Diagnosis present

## 2021-10-26 DIAGNOSIS — I4891 Unspecified atrial fibrillation: Secondary | ICD-10-CM | POA: Diagnosis present

## 2021-10-26 DIAGNOSIS — K219 Gastro-esophageal reflux disease without esophagitis: Secondary | ICD-10-CM | POA: Diagnosis present

## 2021-10-26 DIAGNOSIS — I4821 Permanent atrial fibrillation: Secondary | ICD-10-CM

## 2021-10-26 DIAGNOSIS — Z825 Family history of asthma and other chronic lower respiratory diseases: Secondary | ICD-10-CM | POA: Diagnosis not present

## 2021-10-26 DIAGNOSIS — Z20822 Contact with and (suspected) exposure to covid-19: Secondary | ICD-10-CM | POA: Diagnosis present

## 2021-10-26 DIAGNOSIS — Z8249 Family history of ischemic heart disease and other diseases of the circulatory system: Secondary | ICD-10-CM | POA: Diagnosis not present

## 2021-10-26 DIAGNOSIS — I480 Paroxysmal atrial fibrillation: Secondary | ICD-10-CM | POA: Diagnosis not present

## 2021-10-26 DIAGNOSIS — G8929 Other chronic pain: Secondary | ICD-10-CM | POA: Diagnosis present

## 2021-10-26 DIAGNOSIS — I509 Heart failure, unspecified: Secondary | ICD-10-CM

## 2021-10-26 LAB — BASIC METABOLIC PANEL
Anion gap: 12 (ref 5–15)
BUN: 18 mg/dL (ref 8–23)
CO2: 26 mmol/L (ref 22–32)
Calcium: 9.1 mg/dL (ref 8.9–10.3)
Chloride: 100 mmol/L (ref 98–111)
Creatinine, Ser: 0.67 mg/dL (ref 0.44–1.00)
GFR, Estimated: >60 mL/min (ref >60)
Glucose, Bld: 127 mg/dL — ABNORMAL HIGH (ref 70–99)
Potassium: 3.2 mmol/L — ABNORMAL LOW (ref 3.5–5.1)
Sodium: 138 mmol/L (ref 135–145)

## 2021-10-26 LAB — CBC
HCT: 39.5 % (ref 36.0–46.0)
Hemoglobin: 12.7 g/dL (ref 12.0–15.0)
MCH: 28.9 pg (ref 26.0–34.0)
MCHC: 32.2 g/dL (ref 30.0–36.0)
MCV: 89.8 fL (ref 80.0–100.0)
Platelets: 158 10*3/uL (ref 150–400)
RBC: 4.4 MIL/uL (ref 3.87–5.11)
RDW: 14.4 % (ref 11.5–15.5)
WBC: 11.5 10*3/uL — ABNORMAL HIGH (ref 4.0–10.5)
nRBC: 0 % (ref 0.0–0.2)

## 2021-10-26 LAB — RESP PANEL BY RT-PCR (FLU A&B, COVID) ARPGX2
Influenza A by PCR: NEGATIVE
Influenza B by PCR: NEGATIVE
SARS Coronavirus 2 by RT PCR: NEGATIVE

## 2021-10-26 LAB — TSH: TSH: 1.056 u[IU]/mL (ref 0.350–4.500)

## 2021-10-26 LAB — LACTIC ACID, PLASMA: Lactic Acid, Venous: 1.1 mmol/L (ref 0.5–1.9)

## 2021-10-26 LAB — PROTIME-INR
INR: 1.3 — ABNORMAL HIGH (ref 0.8–1.2)
Prothrombin Time: 16.1 seconds — ABNORMAL HIGH (ref 11.4–15.2)

## 2021-10-26 LAB — BRAIN NATRIURETIC PEPTIDE: B Natriuretic Peptide: 764.6 pg/mL — ABNORMAL HIGH (ref 0.0–100.0)

## 2021-10-26 MED ORDER — BUDESONIDE 0.25 MG/2ML IN SUSP
0.2500 mg | Freq: Two times a day (BID) | RESPIRATORY_TRACT | Status: DC
  Administered 2021-10-26 – 2021-10-29 (×6): 0.25 mg via RESPIRATORY_TRACT
  Filled 2021-10-26 (×6): qty 2

## 2021-10-26 MED ORDER — APIXABAN 2.5 MG PO TABS
2.5000 mg | ORAL_TABLET | Freq: Two times a day (BID) | ORAL | Status: DC
Start: 2021-10-26 — End: 2021-10-29
  Administered 2021-10-26 – 2021-10-29 (×6): 2.5 mg via ORAL
  Filled 2021-10-26 (×6): qty 1

## 2021-10-26 MED ORDER — DOXYCYCLINE HYCLATE 100 MG PO TABS
100.0000 mg | ORAL_TABLET | Freq: Two times a day (BID) | ORAL | Status: DC
  Administered 2021-10-26 – 2021-10-29 (×6): 100 mg via ORAL
  Filled 2021-10-26 (×6): qty 1

## 2021-10-26 MED ORDER — LORAZEPAM 1 MG PO TABS
0.5000 mg | ORAL_TABLET | Freq: Two times a day (BID) | ORAL | Status: DC | PRN
  Administered 2021-10-26 – 2021-10-28 (×3): 1 mg via ORAL
  Filled 2021-10-26 (×3): qty 1

## 2021-10-26 MED ORDER — POTASSIUM CHLORIDE CRYS ER 20 MEQ PO TBCR
40.0000 meq | EXTENDED_RELEASE_TABLET | ORAL | Status: AC
  Administered 2021-10-26 (×2): 40 meq via ORAL
  Filled 2021-10-26 (×2): qty 2

## 2021-10-26 MED ORDER — BENAZEPRIL HCL 20 MG PO TABS
20.0000 mg | ORAL_TABLET | Freq: Every day | ORAL | Status: DC
  Administered 2021-10-27: 20 mg via ORAL
  Filled 2021-10-26 (×2): qty 1

## 2021-10-26 MED ORDER — METOPROLOL TARTRATE 50 MG PO TABS
50.0000 mg | ORAL_TABLET | Freq: Two times a day (BID) | ORAL | Status: DC
  Administered 2021-10-26 – 2021-10-29 (×6): 50 mg via ORAL
  Filled 2021-10-26 (×2): qty 1
  Filled 2021-10-26: qty 2
  Filled 2021-10-26 (×2): qty 1
  Filled 2021-10-26: qty 2

## 2021-10-26 MED ORDER — ENSURE ENLIVE PO LIQD
237.0000 mL | Freq: Two times a day (BID) | ORAL | Status: DC
  Administered 2021-10-27 – 2021-10-28 (×2): 237 mL via ORAL
  Filled 2021-10-26: qty 237

## 2021-10-26 MED ORDER — POLYVINYL ALCOHOL 1.4 % OP SOLN: 1.0000 [drp] | OPHTHALMIC | Status: DC | PRN

## 2021-10-26 MED ORDER — IPRATROPIUM BROMIDE HFA 17 MCG/ACT IN AERS: 2.0000 | INHALATION_SPRAY | Freq: Four times a day (QID) | RESPIRATORY_TRACT | Status: DC | PRN

## 2021-10-26 MED ORDER — GUAIFENESIN ER 600 MG PO TB12
1200.0000 mg | ORAL_TABLET | Freq: Two times a day (BID) | ORAL | Status: DC
  Administered 2021-10-26 – 2021-10-29 (×6): 1200 mg via ORAL
  Filled 2021-10-26 (×6): qty 2

## 2021-10-26 MED ORDER — PANTOPRAZOLE SODIUM 40 MG PO TBEC
40.0000 mg | DELAYED_RELEASE_TABLET | Freq: Every day | ORAL | Status: DC
  Administered 2021-10-27 – 2021-10-29 (×3): 40 mg via ORAL
  Filled 2021-10-26 (×3): qty 1

## 2021-10-26 MED ORDER — PHILLIPS COLON HEALTH PO CAPS: 1.0000 | ORAL_CAPSULE | Freq: Every day | ORAL | Status: DC

## 2021-10-26 MED ORDER — OXYCODONE-ACETAMINOPHEN 5-325 MG PO TABS
1.0000 | ORAL_TABLET | Freq: Four times a day (QID) | ORAL | Status: DC | PRN
Start: 2021-10-26 — End: 2021-10-29
  Administered 2021-10-28 (×2): 1 via ORAL
  Filled 2021-10-26 (×2): qty 1

## 2021-10-26 MED ORDER — SODIUM CHLORIDE 0.9 % IV SOLN
100.0000 mg | Freq: Once | INTRAVENOUS | Status: AC
  Administered 2021-10-26: 100 mg via INTRAVENOUS
  Filled 2021-10-26: qty 100

## 2021-10-26 MED ORDER — ROSUVASTATIN CALCIUM 5 MG PO TABS
10.0000 mg | ORAL_TABLET | Freq: Every day | ORAL | Status: DC
  Administered 2021-10-27 – 2021-10-29 (×3): 10 mg via ORAL
  Filled 2021-10-26 (×3): qty 2

## 2021-10-26 MED ORDER — IPRATROPIUM BROMIDE 0.02 % IN SOLN
0.5000 mg | Freq: Four times a day (QID) | RESPIRATORY_TRACT | Status: DC
  Administered 2021-10-26 – 2021-10-27 (×2): 0.5 mg via RESPIRATORY_TRACT
  Filled 2021-10-26 (×2): qty 2.5

## 2021-10-26 MED ORDER — SODIUM CHLORIDE 0.9 % IV SOLN
1.0000 g | Freq: Once | INTRAVENOUS | Status: AC
  Administered 2021-10-26: 1 g via INTRAVENOUS
  Filled 2021-10-26: qty 10

## 2021-10-26 MED ORDER — IPRATROPIUM BROMIDE 0.02 % IN SOLN: 0.5000 mg | Freq: Four times a day (QID) | RESPIRATORY_TRACT | Status: DC | PRN

## 2021-10-26 MED ORDER — LORATADINE 10 MG PO TABS
10.0000 mg | ORAL_TABLET | Freq: Every day | ORAL | Status: DC
  Administered 2021-10-27 – 2021-10-29 (×3): 10 mg via ORAL
  Filled 2021-10-26 (×3): qty 1

## 2021-10-26 MED ORDER — ATENOLOL 25 MG PO TABS
25.0000 mg | ORAL_TABLET | Freq: Every day | ORAL | Status: DC
Start: 2021-10-26 — End: 2021-10-26

## 2021-10-26 MED ORDER — FUROSEMIDE 10 MG/ML IJ SOLN: 20.0000 mg | Freq: Every day | INTRAMUSCULAR | Status: DC

## 2021-10-26 MED ORDER — POTASSIUM CHLORIDE CRYS ER 20 MEQ PO TBCR
40.0000 meq | EXTENDED_RELEASE_TABLET | Freq: Two times a day (BID) | ORAL | Status: DC
  Administered 2021-10-26: 40 meq via ORAL
  Filled 2021-10-26: qty 2

## 2021-10-26 MED ORDER — FUROSEMIDE 10 MG/ML IJ SOLN
20.0000 mg | Freq: Every day | INTRAMUSCULAR | Status: DC
  Administered 2021-10-26 – 2021-10-27 (×2): 20 mg via INTRAVENOUS
  Filled 2021-10-26 (×2): qty 2

## 2021-10-26 MED ORDER — ALBUTEROL SULFATE HFA 108 (90 BASE) MCG/ACT IN AERS: 2.0000 | INHALATION_SPRAY | RESPIRATORY_TRACT | Status: DC | PRN

## 2021-10-26 NOTE — H&P (Addendum)
History and Physical    KADEY Pugh [id number] DOB: 1932/03/12 DOA: 10/26/2021  PCP: Crist Infante, MD (Confirm with patient/family/NH records and if not entered, this has to be entered at [facility] point of entry) Patient coming from: [facility]  I have personally briefly reviewed patient's old medical records in [place]  Chief Complaint: SOB  HPI: Laurie Pugh is a 88 y.o. female with medical history significant of chronic a-flutter, HTN, chronic back pain on narcotics, presented with cough and increasing shortness of breath.  Symptoms started 2 days ago, patient developed occasional dry cough and new onset of shortness of breath.  Denies any chest pains.  She also complained about episodic palpitations.  On further asking, she reported that she has had frequent palpitations " for a while", some episodes can be as frequent as 2-3 times a week and lasted as long as 1 day.  She denies any fever or chills, and cough has been dry.  She usually only sleep 3 hours a day, and she denied orthopnea.  She denied urinary symptoms no diarrhea.  She reported that her PCP has been doing her A. fib medications and she has not seen a cardiologist for several years.  At baseline, patient lives by herself, she eats regular food and no history of choking or cough after eating.  ED Course: Patient was found in and out uncontrolled a flutter.  Blood pressure remained stable, O2 saturation 89% on room air on arrival, stabilized on 2 L.  Chest x-ray suspicious for right lower field.  Review of Systems: As per HPI otherwise 14 point review of systems negative.    Past Medical History:  Diagnosis Date   Allergy    Angina pectoris, unstable (HCC)    Atrial fibrillation (HCC)    Cataract    Dyslipidemia    Dysrhythmia    Esophageal stricture    GERD (gastroesophageal reflux disease)    Hyperlipidemia    Hypertension    Neck pain    PAF (paroxysmal atrial fibrillation) (HCC)    PSVT (paroxysmal  supraventricular tachycardia) ([place])     Past Surgical History:  Procedure Laterality Date   ABDOMINAL HYSTERECTOMY     APPENDECTOMY     BLADDER SURGERY     bladder surgery   BREAST EXCISIONAL BIOPSY     CARDIAC CATHETERIZATION  06/28/2004   no significant CAD (Dr. Gerrie Nordmann)   CATARACT EXTRACTION, BILATERAL  2008   CYSTOSCOPY W/ RETROGRADES Bilateral 04/11/2016   Procedure: CYSTOSCOPY WITH BILATERAL RETROGRADE PYELOGRAM;  Surgeon: Nickie Retort, MD;  Location: [facility];  Service: Urology;  Laterality: Bilateral;   ESOPHAGOGASTRODUODENOSCOPY N/A 01/30/2017   Procedure: ESOPHAGOGASTRODUODENOSCOPY (EGD);  Surgeon: Irene Shipper, MD;  Location: [facility];  Service: Endoscopy;  Laterality: N/A;   ESOPHAGOGASTRODUODENOSCOPY N/A 01/30/2017   Procedure: ESOPHAGOGASTRODUODENOSCOPY (EGD);  Surgeon: Mauri Pole, MD;  Location: [facility];  Service: Endoscopy;  Laterality: N/A;   IR VERTEBROPLASTY LUMBAR BX INC UNI/BIL INC/INJECT/IMAGING  03/15/2019   TRANSTHORACIC ECHOCARDIOGRAM  2009   borderline conc LVH; trace MR; mild-mod TR, RVSP 30-31mmHg     reports that she quit smoking about 18 years ago. She has a 40.00 pack-year smoking history. She has never used smokeless tobacco. She reports that she does not drink alcohol and does not use drugs.  Allergies  Allergen Reactions   Codeine Nausea And Vomiting   Erythromycin Other (See Comments)    Mycins cause severe nausea/vomiting and diarrhea (thinks it was erythromycin)  Family History  Problem Relation Age of Onset   Heart disease Mother    Heart disease Sister    Suicidality Brother    Cancer Sister    COPD Sister    Suicidality Child    Colon cancer Neg Hx    Colon polyps Neg Hx    Esophageal cancer Neg Hx    Rectal cancer Neg Hx    Stomach cancer Neg Hx      Prior to Admission medications   Medication Sig Start Date End Date Taking? Authorizing Provider  apixaban (ELIQUIS) 5 MG TABS tablet Take 2.5 mg by mouth 2  (two) times daily.    [provider]  atenolol (TENORMIN) 25 MG tablet TAKE 1 TABLET BY MOUTH  DAILY 06/30/21   Hilty, Nadean Corwin, MD  benazepril (LOTENSIN) 20 MG tablet Take 20 mg by mouth daily.  05/30/13   [provider]  chlorthalidone (HYGROTON) 25 MG tablet Take 12.5-25 mg by mouth daily as needed (for SBP >140).    [provider]  loratadine (CLARITIN) 10 MG tablet Take 10 mg by mouth daily.    [provider]  LORazepam (ATIVAN) 1 MG tablet Take 0.5-1 mg by mouth 2 (two) times daily as needed for anxiety.  05/30/13   [provider]  omeprazole (PRILOSEC) 40 MG capsule TAKE 1 CAPSULE BY MOUTH  DAILY 01/14/21   Levin Erp, PA  oxyCODONE-acetaminophen (PERCOCET/ROXICET) 5-325 MG tablet Take 1 tablet by mouth every 6 (six) hours as needed (pain).  02/06/19   [provider]  polyvinyl alcohol (LIQUIFILM TEARS) 1.4 % ophthalmic solution Place 1 drop into both eyes as needed for dry eyes. 03/28/21   Swayze, Ava, DO  Probiotic Product ([place]) CAPS Take 1 capsule by mouth daily.    [provider]  rosuvastatin (CRESTOR) 10 MG tablet Take 10 mg by mouth daily.    [provider]    Physical Exam: Vitals:   10/26/21 1406 10/26/21 1415 10/26/21 1430 10/26/21 1500  BP:  126/85 108/68 135/74  Pulse:  91 76 85  Resp:  18 (!) 24 (!) 21  Temp: 97.7 F (36.5 C)     TempSrc: Oral     SpO2:  95% 97% 99%    Constitutional: NAD, calm, comfortable Vitals:   10/26/21 1406 10/26/21 1415 10/26/21 1430 10/26/21 1500  BP:  126/85 108/68 135/74  Pulse:  91 76 85  Resp:  18 (!) 24 (!) 21  Temp: 97.7 F (36.5 C)     TempSrc: Oral     SpO2:  95% 97% 99%   Eyes: PERRL, lids and conjunctivae normal ENMT: Mucous membranes are moist. Posterior pharynx clear of any exudate or lesions.Normal dentition.  Neck: normal, supple, no masses, no thyromegaly Respiratory: clear to auscultation bilaterally, no wheezing,  fine crackles on B/L mid-fields. Increasing respiratory effort, talking in broken sentences. No accessory muscle use.  Cardiovascular: Irregular rate, no murmurs / rubs / gallops. No extremity edema. 2+ pedal pulses. No carotid bruits.  Abdomen: no tenderness, no masses palpated. No hepatosplenomegaly. Bowel sounds positive.  Musculoskeletal: no clubbing / cyanosis. No joint deformity upper and lower extremities. Good ROM, no contractures. Normal muscle tone.  Skin: no rashes, lesions, ulcers. No induration Neurologic: CN 2-12 grossly intact. Sensation intact, DTR normal. Strength 5/5 in all 4.  Psychiatric: Normal judgment and insight. Alert and oriented x 3. Normal mood.     Labs on Admission: I have personally reviewed following labs  and imaging studies  CBC: Recent Labs  Lab 10/26/21 1402  WBC 11.5*  HGB 12.7  HCT 39.5  MCV 89.8  PLT 158   Basic Metabolic Panel: Recent Labs  Lab 10/26/21 1402  NA 138  K 3.2*  CL 100  CO2 26  GLUCOSE 127*  BUN 18  CREATININE 0.67  CALCIUM 9.1   GFR: CrCl cannot be calculated (Unknown ideal weight.). Liver Function Tests: No results for input(s): AST, ALT, ALKPHOS, BILITOT, PROT, ALBUMIN in the last 168 hours. No results for input(s): LIPASE, AMYLASE in the last 168 hours. No results for input(s): AMMONIA in the last 168 hours. Coagulation Profile: Recent Labs  Lab 10/26/21 1402  INR 1.3*   Cardiac Enzymes: No results for input(s): CKTOTAL, CKMB, CKMBINDEX, TROPONINI in the last 168 hours. BNP (last 3 results) No results for input(s): PROBNP in the last 8760 hours. HbA1C: No results for input(s): HGBA1C in the last 72 hours. CBG: No results for input(s): GLUCAP in the last 168 hours. Lipid Profile: No results for input(s): CHOL, HDL, LDLCALC, TRIG, CHOLHDL, LDLDIRECT in the last 72 hours. Thyroid Function Tests: No results for input(s): TSH, T4TOTAL, FREET4, T3FREE, THYROIDAB in the last 72 hours. Anemia Panel: No results  for input(s): VITAMINB12, FOLATE, FERRITIN, TIBC, IRON, RETICCTPCT in the last 72 hours. Urine analysis:    Component Value Date/Time   COLORURINE YELLOW 06/24/2021 1130   APPEARANCEUR CLOUDY (A) 06/24/2021 1130   LABSPEC 1.013 06/24/2021 1130   PHURINE 6.0 06/24/2021 1130   GLUCOSEU NEGATIVE 06/24/2021 1130   HGBUR LARGE (A) 06/24/2021 1130   BILIRUBINUR NEGATIVE 06/24/2021 1130   KETONESUR NEGATIVE 06/24/2021 1130   PROTEINUR >300 (A) 06/24/2021 1130   NITRITE POSITIVE (A) 06/24/2021 1130   LEUKOCYTESUR LARGE (A) 06/24/2021 1130    Radiological Exams on Admission: DG Chest 2 View  Result Date: 10/26/2021 CLINICAL DATA:  Shortness of breath EXAM: CHEST - 2 VIEW COMPARISON:  Chest radiograph 06/24/2021 FINDINGS: The heart is mildly enlarged, unchanged. The upper mediastinal contours are stable. There are coarsened interstitial markings bilaterally, similar to the prior study and likely chronic. There is patchy opacity in the right lower lobe not present on the prior study. There is no significant pleural effusion. There is no pneumothorax. There is a moderate size hiatal hernia, unchanged. There is no acute osseous abnormality. There is unchanged compression deformity of an upper lumbar vertebral body. IMPRESSION: 1. Patchy opacity in the right lower lobe not present on the prior study could reflect infection or aspiration. 2. Moderate-sized hiatal hernia. Electronically Signed   By: Lesia Hausen M.D.   On: 10/26/2021 14:53    EKG: Independently reviewed.  A flutter with QTC prolongation  Assessment/Plan Principal Problem:   CHF (congestive heart failure) (HCC) Active Problems:   Atrial fibrillation (HCC)  (please populate well all problems here in Problem List. (For example, if patient is on BP meds at home and you resume or decide to hold them, it is a problem that needs to be her. Same for CAD, COPD, HLD and so on)  Acute diastolic CHF decompensation -Likely secondary to  uncontrolled a flutter -Continue current rate control medication from atenolol 25 mg daily to metoprolol 50 mg twice daily.  Continue Eliquis. -Repeat echocardiogram, patient has a abnormal CT abdomen/chest in September 2022 which showed cardiomegaly and biventricular dilation, liver congestion all implying a CHF. D/W cardiology NP, cardiology will see the patient -Lasix 20 mg twice daily  Right-sided pneumonia -Will treated as CAP,  but unlikely this CAP causing all her breathing symptoms -Will also consult speech. -Continue Doxy BID.  A flutter, chronic, poorly controlled -Rate control as above, continue Eliquis.  HTN -Continue ARB, hold chlorthalidone while on Lasix.  Metoprolol replaced atenolol.  Question of COPD -Hx of ex-smoker -There was a abnormal CT chest last year showing COPD like changes -Not much wheezing on exam, continue Atrovent nebulizer and as needed, add Pulmicort. -Outpatient pulm function test.  Hypokalemia -P.o. replacement and recheck level tomorrow.  Send magnesium level.  Prolonged QTC -Use Doxycycline for PNA.  Sever protein calory malnutrition -BMI=18 -Consult dietitian, start Supplement.  DVT prophylaxis: Eliquis Code Status: Full code, I asked daughter to discuss with patient regarding CODE STATUS Family Communication: Daughter over the phone Disposition Plan: Patient is sick with complicated medical problems involving CHF decompensation uncontrolled a flutter and the pneumonia, expect more than 2 midnight hospital stay. Consults called: Cardiology Admission status: Telemetry admission   Lequita Halt MD [facility] Pager [phone]  10/26/2021, 4:05 PM

## 2021-10-26 NOTE — ED Notes (Signed)
Cardiology at bedside.

## 2021-10-26 NOTE — ED Provider Notes (Signed)
[facility] EMERGENCY DEPARTMENT Provider Note   CSN: [id number] Arrival date & time: 10/26/21  1351     History  Chief Complaint  Patient presents with   Shortness of Breath    Laurie Pugh is a 88 y.o. female.  HPI     88 year old female with a history of atrial fibrillation on Eliquis, dyslipidemia, hypertension, paroxysmal supraventricular tachycardia, who presents with concern for increased shortness of breath since yesterday morning.  Felt kind of heavy, short of breath starting yesterday, slowly developed, worsened last night No chest pain, no arm pain, no jaw pain Nausea, no vomiting, usually try to eat something with medicine but didn't today Usually wait then eat toast but didn't feel like eating Has mild chronic cough, not worsening, coughs up phlegm every once in while Worse with walking, then takes a while to recover.  Is worse with laying down flat.  During the night couldn't sleep, woke up short of breath in middle of the night.  Has some fluid in legs at baseline, not worse Take eliquis 2.5mg  BID  Past Medical History:  Diagnosis Date   Allergy    Angina pectoris, unstable (HCC)    Cataract    Dyslipidemia    Esophageal stricture    GERD (gastroesophageal reflux disease)    Hyperlipidemia    Hypertension    Neck pain    PAF (paroxysmal atrial fibrillation) (HCC)    PSVT (paroxysmal supraventricular tachycardia) ([place])      Home Medications Prior to Admission medications   Medication Sig Start Date End Date Taking? Authorizing Provider  apixaban (ELIQUIS) 5 MG TABS tablet Take 2.5 mg by mouth 2 (two) times daily.   Yes [provider]  atenolol (TENORMIN) 25 MG tablet TAKE 1 TABLET BY MOUTH  DAILY Patient taking differently: Take 25 mg by mouth daily. 06/30/21  Yes Hilty, Nadean Corwin, MD  benazepril (LOTENSIN) 20 MG tablet Take 20 mg by mouth daily.  05/30/13  Yes [provider]  chlorthalidone (HYGROTON) 25 MG  tablet Take 12.5-25 mg by mouth daily as needed (for SBP >140).   Yes [provider]  loratadine (CLARITIN) 10 MG tablet Take 10 mg by mouth daily.   Yes [provider]  LORazepam (ATIVAN) 1 MG tablet Take 0.5-1 mg by mouth 2 (two) times daily as needed for anxiety.  05/30/13  Yes [provider]  omeprazole (PRILOSEC) 40 MG capsule TAKE 1 CAPSULE BY MOUTH  DAILY 01/14/21  Yes Levin Erp, PA  oxyCODONE-acetaminophen (PERCOCET/ROXICET) 5-325 MG tablet Take 1 tablet by mouth every 6 (six) hours as needed (pain).  02/06/19  Yes [provider]  polyvinyl alcohol (LIQUIFILM TEARS) 1.4 % ophthalmic solution Place 1 drop into both eyes as needed for dry eyes. 03/28/21  Yes Swayze, Ava, DO  rosuvastatin (CRESTOR) 10 MG tablet Take 10 mg by mouth daily.   Yes [provider]  ANORO ELLIPTA 62.5-25 MCG/ACT AEPB Inhale 1 puff into the lungs daily. 10/22/21   [provider]  Probiotic Product (Kevin) CAPS Take 1 capsule by mouth daily. Patient not taking: Reported on 10/26/2021    [provider]      Allergies    Codeine and Erythromycin    Review of Systems   Review of Systems See above  Physical Exam Updated Vital Signs BP 138/77    Pulse 85    Temp 97.7 F (36.5 C) (Oral)    Resp (!) 21  SpO2 93%  Physical Exam Vitals and nursing note reviewed.  Constitutional:      General: She is not in acute distress.    Appearance: She is well-developed. She is not diaphoretic.  HENT:     Head: Normocephalic and atraumatic.  Eyes:     Conjunctiva/sclera: Conjunctivae normal.  Cardiovascular:     Rate and Rhythm: Normal rate and regular rhythm.     Heart sounds: Normal heart sounds. No murmur heard.   No friction rub. No gallop.  Pulmonary:     Effort: Pulmonary effort is normal. No respiratory distress.     Breath sounds: Normal breath sounds. No wheezing or rales.  Abdominal:     General: There is no  distension.     Palpations: Abdomen is soft.     Tenderness: There is no abdominal tenderness. There is no guarding.  Musculoskeletal:     Cervical back: Normal range of motion.     Right lower leg: Tenderness (reports chronic) present. No edema.     Left lower leg: No edema.  Skin:    General: Skin is warm and dry.     Findings: No erythema or rash.  Neurological:     Mental Status: She is alert and oriented to person, place, and time.    ED Results / Procedures / Treatments   Labs (all labs ordered are listed, but only abnormal results are displayed) Labs Reviewed  BASIC METABOLIC PANEL - Abnormal; Notable for the following components:      Result Value   Potassium 3.2 (*)    Glucose, Bld 127 (*)    All other components within normal limits  CBC - Abnormal; Notable for the following components:   WBC 11.5 (*)    All other components within normal limits  PROTIME-INR - Abnormal; Notable for the following components:   Prothrombin Time 16.1 (*)    INR 1.3 (*)    All other components within normal limits  BRAIN NATRIURETIC PEPTIDE - Abnormal; Notable for the following components:   B Natriuretic Peptide 764.6 (*)    All other components within normal limits  RESP PANEL BY RT-PCR (FLU A&B, COVID) ARPGX2  CULTURE, BLOOD (ROUTINE X 2)  CULTURE, BLOOD (ROUTINE X 2)  LACTIC ACID, PLASMA  TSH  LACTIC ACID, PLASMA  MAGNESIUM  STREP PNEUMONIAE URINARY ANTIGEN  MYCOPLASMA PNEUMONIAE ANTIBODY, IGM  BASIC METABOLIC PANEL    EKG EKG Interpretation  Date/Time:  Tuesday October 26 2021 14:06:00 EST Ventricular Rate:  97 PR Interval:    QRS Duration: 86 QT Interval:  417 QTC Calculation: 505 R Axis:   -43 Text Interpretation: Atrial fibrillation Multiple ventricular premature complexes Left anterior fascicular block RSR' in V1 or V2, probably normal variant LVH with secondary repolarization abnormality Prolonged QT interval Baseline wander in lead(s) V3 Confirmed by Gareth Morgan [phone]) on 10/26/2021 5:18:04 PM  Radiology DG Chest 2 View  Result Date: 10/26/2021 CLINICAL DATA:  Shortness of breath EXAM: CHEST - 2 VIEW COMPARISON:  Chest radiograph 06/24/2021 FINDINGS: The heart is mildly enlarged, unchanged. The upper mediastinal contours are stable. There are coarsened interstitial markings bilaterally, similar to the prior study and likely chronic. There is patchy opacity in the right lower lobe not present on the prior study. There is no significant pleural effusion. There is no pneumothorax. There is a moderate size hiatal hernia, unchanged. There is no acute osseous abnormality. There is unchanged compression deformity of an upper lumbar vertebral body. IMPRESSION: 1. Patchy opacity  in the right lower lobe not present on the prior study could reflect infection or aspiration. 2. Moderate-sized hiatal hernia. Electronically Signed   By: Valetta Mole M.D.   On: 10/26/2021 14:53    Procedures Procedures    Medications Ordered in ED Medications  doxycycline (VIBRAMYCIN) 100 mg in sodium chloride 0.9 % 250 mL IVPB (has no administration in time range)  oxyCODONE-acetaminophen (PERCOCET/ROXICET) 5-325 MG per tablet 1 tablet (has no administration in time range)  furosemide (LASIX) injection 20 mg (has no administration in time range)  benazepril (LOTENSIN) tablet 20 mg (has no administration in time range)  rosuvastatin (CRESTOR) tablet 10 mg (has no administration in time range)  LORazepam (ATIVAN) tablet 0.5-1 mg (has no administration in time range)  pantoprazole (PROTONIX) EC tablet 40 mg (has no administration in time range)  Phillips Colon Health CAPS 1 capsule (has no administration in time range)  apixaban (ELIQUIS) tablet 2.5 mg (has no administration in time range)  loratadine (CLARITIN) tablet 10 mg (has no administration in time range)  polyvinyl alcohol (LIQUIFILM TEARS) 1.4 % ophthalmic solution 1 drop (has no administration in time range)   ipratropium (ATROVENT) nebulizer solution 0.5 mg (has no administration in time range)  ipratropium (ATROVENT HFA) inhaler 2 puff (has no administration in time range)  metoprolol tartrate (LOPRESSOR) tablet 50 mg (has no administration in time range)  guaiFENesin (MUCINEX) 12 hr tablet 1,200 mg (has no administration in time range)  potassium chloride SA (KLOR-CON M) CR tablet 40 mEq (has no administration in time range)  doxycycline (VIBRA-TABS) tablet 100 mg (has no administration in time range)  budesonide (PULMICORT) nebulizer solution 0.25 mg (has no administration in time range)  feeding supplement (ENSURE ENLIVE / ENSURE PLUS) liquid 237 mL (has no administration in time range)  cefTRIAXone (ROCEPHIN) 1 g in sodium chloride 0.9 % 100 mL IVPB (1 g Intravenous New Bag/Given 10/26/21 1549)    ED Course/ Medical Decision Making/ A&P    88 year old female with a history of atrial fibrillation on Eliquis, dyslipidemia, hypertension, paroxysmal supraventricular tachycardia, who presents with concern for increased shortness of breath since yesterday morning.  Differential diagnosis for dyspnea includes ACS, PE, COPD exacerbation, CHF exacerbation, anemia, pneumonia, viral etiology such as COVID 19 infection, metabolic abnormality.    Chest x-ray was personally evaluated by me and radiology shows right sided opacity, possible pneumonia. EKG was evaluated by me which showed atrial fibrillation.  BNP was pending at time of admission but later returned elevated to 764. Low suspicion for PE given pt on anticoagulation.   Given fatigue, decreased appetite, nausea, opacity on CXR suspect symptoms secondary to community-acquired pneumonia versus CHF.  Ordered blood cultures, lactic acid, and Rocephin and azithromycin.  She presented to the emergency department with hypoxia down to 89%, will admit for continued pneumonia treatment.  BNP later returned  elevated.          Final Clinical  Impression(s) / ED Diagnoses Final diagnoses:  Shortness of breath  Community acquired pneumonia of right lower lobe of lung    Rx / DC Orders ED Discharge Orders     None         Gareth Morgan, MD 10/26/21 1722

## 2021-10-26 NOTE — ED Notes (Signed)
Patient transported to X-ray 

## 2021-10-26 NOTE — Progress Notes (Signed)
Heart Failure Nurse Navigator Progress Note  Following this hospitalization to assess for HV TOC readiness.   ECHO: 02/2021 60-65%, mild LVH, severely dilated LA, mild-moderate TR, mild AR.  Potassium 3.2 at 14:02 labs.  BNP 169.7  Ozella Rocks, MSN, RN Heart Failure Nurse Navigator [phone]

## 2021-10-26 NOTE — ED Triage Notes (Signed)
BIB [facility] after pt called to report increase SHOB since yesterday morning. Per EMS, pt was 90% on RA upon arrival. Pt placed on 2 L [place], 96%. Denies c/p.    HX: afib, chronic back pain

## 2021-10-26 NOTE — Consult Note (Signed)
Cardiology Consultation:   Patient ID: Laurie Pugh MRN: [id number]; DOB: 02-02-1932  Admit date: 10/26/2021 Date of Consult: 10/26/2021  PCP:  Rodrigo Ran, MD   [facility] Providers Cardiologist:  Chrystie Nose, MD        Patient Profile:   Laurie Pugh is a 88 y.o. female with a hx of PAF on Eliquis, PSVT, HTN, HLD,  who is being seen 10/26/2021 for the evaluation of arrhythmia, at the request of Dr Chipper Herb.  History of Present Illness:   Ms. Laurie Pugh had a virtual visit with Dr Rennis Golden 02/11/2019. She was cardiac stable, f/u in 1 year.   She was BIB [facility] for increased SOB, O2 sats 90% on RA. CXR w/ ?PNA vs aspiration RLL. Pt in rapid atrial flutter, Cards asked to see.   [name] began getting SOB 2 days ago. She denies fevers.   She had a minor cough, small amount of pinkish phlegm.   She gets atrial fib on a regular basis, does not think she had it until EMS got to the house.  She feels the Afib, is in it for several days at a time at times. She does not think the frequency or duration of the episodes has changed in the last few years. She feels the Afib, but denies being light-headed, dizzy or having any CP or SOB with the Afib.   In the hospital, she was dx w/ PNA, is on O2 and feels better.    Past Medical History:  Diagnosis Date   Allergy    Angina pectoris, unstable (HCC)    Cataract    Dyslipidemia    Esophageal stricture    GERD (gastroesophageal reflux disease)    Hyperlipidemia    Hypertension    Neck pain    PAF (paroxysmal atrial fibrillation) (HCC)    PSVT (paroxysmal supraventricular tachycardia) (HCC)     Past Surgical History:  Procedure Laterality Date   ABDOMINAL HYSTERECTOMY     APPENDECTOMY     BLADDER SURGERY     bladder surgery   BREAST EXCISIONAL BIOPSY     CARDIAC CATHETERIZATION  06/28/2004   no significant CAD (Dr. Laurell Josephs)   CATARACT EXTRACTION, BILATERAL  2008   CYSTOSCOPY W/ RETROGRADES Bilateral 04/11/2016    Procedure: CYSTOSCOPY WITH BILATERAL RETROGRADE PYELOGRAM;  Surgeon: Hildred Laser, MD;  Location: [facility];  Service: Urology;  Laterality: Bilateral;   ESOPHAGOGASTRODUODENOSCOPY N/A 01/30/2017   Procedure: ESOPHAGOGASTRODUODENOSCOPY (EGD);  Surgeon: Hilarie Fredrickson, MD;  Location: [facility];  Service: Endoscopy;  Laterality: N/A;   ESOPHAGOGASTRODUODENOSCOPY N/A 01/30/2017   Procedure: ESOPHAGOGASTRODUODENOSCOPY (EGD);  Surgeon: Napoleon Form, MD;  Location: [facility];  Service: Endoscopy;  Laterality: N/A;   IR VERTEBROPLASTY LUMBAR BX INC UNI/BIL INC/INJECT/IMAGING  03/15/2019   TRANSTHORACIC ECHOCARDIOGRAM  2009   borderline conc LVH; trace MR; mild-mod TR, RVSP 30-59mmHg     Home Medications:  Prior to Admission medications   Medication Sig Start Date End Date Taking? Authorizing Provider  apixaban (ELIQUIS) 5 MG TABS tablet Take 2.5 mg by mouth 2 (two) times daily.    [provider]  atenolol (TENORMIN) 25 MG tablet TAKE 1 TABLET BY MOUTH  DAILY 06/30/21   Hilty, Lisette Abu, MD  benazepril (LOTENSIN) 20 MG tablet Take 20 mg by mouth daily.  05/30/13   [provider]  chlorthalidone (HYGROTON) 25 MG tablet Take 12.5-25 mg by mouth daily as needed (for SBP >140).    [provider]  loratadine (CLARITIN) 10 MG tablet Take 10 mg by mouth daily.    [provider]  LORazepam (ATIVAN) 1 MG tablet Take 0.5-1 mg by mouth 2 (two) times daily as needed for anxiety.  05/30/13   [provider]  omeprazole (PRILOSEC) 40 MG capsule TAKE 1 CAPSULE BY MOUTH  DAILY 01/14/21   Unk Lightning, PA  oxyCODONE-acetaminophen (PERCOCET/ROXICET) 5-325 MG tablet Take 1 tablet by mouth every 6 (six) hours as needed (pain).  02/06/19   [provider]  polyvinyl alcohol (LIQUIFILM TEARS) 1.4 % ophthalmic solution Place 1 drop into both eyes as needed for dry eyes. 03/28/21   Swayze, Ava, DO  Probiotic Product (PHILLIPS COLON HEALTH) CAPS Take 1  capsule by mouth daily.    [provider]  rosuvastatin (CRESTOR) 10 MG tablet Take 10 mg by mouth daily.    [provider]    Inpatient Medications: Scheduled Meds:  apixaban  2.5 mg Oral BID   benazepril  20 mg Oral Daily   budesonide (PULMICORT) nebulizer solution  0.25 mg Nebulization BID   doxycycline  100 mg Oral Q12H   [START ON 10/27/2021] feeding supplement  237 mL Oral BID BM   furosemide  20 mg Intravenous Daily   guaiFENesin  1,200 mg Oral BID   ipratropium  0.5 mg Nebulization Q6H   loratadine  10 mg Oral Daily   metoprolol tartrate  50 mg Oral BID   pantoprazole  40 mg Oral Daily   Phillips Colon Health  1 capsule Oral Daily   potassium chloride  40 mEq Oral Q2H   rosuvastatin  10 mg Oral Daily   Continuous Infusions:  doxycycline (VIBRAMYCIN) IV     PRN Meds: ipratropium, LORazepam, oxyCODONE-acetaminophen, polyvinyl alcohol  Allergies:    Allergies  Allergen Reactions   Codeine Nausea And Vomiting   Erythromycin Other (See Comments)    Mycins cause severe nausea/vomiting and diarrhea (thinks it was erythromycin)    Social History:   Social History   Socioeconomic History   Marital status: Widowed    Spouse name: Not on file   Number of children: 4   Years of education: Not on file   Highest education level: Not on file  Occupational History   Occupation: retired  Tobacco Use   Smoking status: Former    Packs/day: 1.00    Years: 40.00    Pack years: 40.00    Types: Cigarettes    Quit date: 06/25/2003    Years since quitting: 18.3   Smokeless tobacco: Never  Vaping Use   Vaping Use: Never used  Substance and Sexual Activity   Alcohol use: No   Drug use: No   Sexual activity: Not on file  Other Topics Concern   Not on file  Social History Narrative   Not on file   Social Determinants of Health   Financial Resource Strain: Not on file  Food Insecurity: Not on file  Transportation Needs: Not on file  Physical Activity:  Not on file  Stress: Not on file  Social Connections: Not on file  Intimate Partner Violence: Not on file    Family History:   Family History  Problem Relation Age of Onset   Heart disease Mother    Heart disease Sister    Suicidality Brother    Cancer Sister    COPD Sister    Suicidality Child    Colon cancer Neg Hx    Colon polyps Neg Hx  Esophageal cancer Neg Hx    Rectal cancer Neg Hx    Stomach cancer Neg Hx      ROS:  Please see the history of present illness.  All other ROS reviewed and negative.     Physical Exam/Data:   Vitals:   10/26/21 1500 10/26/21 1645 10/26/21 1700 10/26/21 1715  BP: 135/74 138/88 129/72 138/77  Pulse: 85 100 79 85  Resp: (!) 21     Temp:      TempSrc:      SpO2: 99% 96% 93% 93%   No intake or output data in the 24 hours ending 10/26/21 1722 Last 3 Weights 06/24/2021 03/20/2021 11/09/2019  Weight (lbs) 101 lb 120 lb 125 lb  Weight (kg) 45.813 kg 54.432 kg 56.7 kg     There is no height or weight on file to calculate BMI.  General:  Thin, elderly female, in no acute distress HEENT: normal for age Neck: no JVD Vascular: No carotid bruits; Distal pulses 2+ bilaterally Cardiac:  normal S1, S2; Irreg R&R; no murmur  Lungs:  rales bases bilaterally, no wheezing, rhonchi    Abd: soft, nontender, no hepatomegaly  Ext: no edema Musculoskeletal:  No deformities, BUE and BLE strength weak but equal; having back pain Skin: warm and dry  Neuro:  CNs 2-12 intact, no focal abnormalities noted Psych:  Normal affect   EKG:  The EKG was personally reviewed and demonstrates:  Atrial flutter, HR 97, appearance similar to ECG 02/12/2018 Telemetry:  Telemetry was personally reviewed and demonstrates:  atrial fib, rate generally ok  Relevant CV Studies:  ECHO: ordered  ECHO: 03/22/2021  1. Left ventricular ejection fraction, by estimation, is 60 to 65%. The  left ventricle has normal function. The left ventricle has no regional  wall motion  abnormalities. There is mild concentric left ventricular  hypertrophy. Left ventricular diastolic  parameters are indeterminate. Elevated left atrial pressure.   2. Right ventricular systolic function was not well visualized, but is  likely low normal. The right ventricular size is normal. There is  moderately elevated pulmonary artery systolic pressure.   3. Left atrial size was severely dilated.   4. Right atrial size was mildly dilated.   5. The mitral valve is normal in structure. No evidence of mitral valve regurgitation.   6. Tricuspid valve regurgitation is mild to moderate; valve morphology is not well visualized but a mechanism of valve prolapse is suggestive by Spectral Doppler signal.   7. The aortic valve is tricuspid. Aortic valve regurgitation is mild.  Aortic regurgitation PHT measures 694 msec.   Comparison(s): A prior study was performed on 06/28/2004. Prior images reviewed side by side. Similar study, aortic regurgitation is new from prior.   MYOVIEW: 10/26/2021 Impression Exercise Capacity:  Lexiscan with no exercise. BP Response:  Normal blood pressure response. Clinical Symptoms:  No significant symptoms noted. ECG Impression:  No significant ECG changes with Lexiscan. Comparison with Prior Nuclear Study: No previous nuclear study performed   Overall Impression:  Low risk stress nuclear study with breast attenuation artifact..    Laboratory Data:  High Sensitivity Troponin:  No results for input(s): TROPONINIHS in the last 720 hours.   Chemistry Recent Labs  Lab 10/26/21 1402  NA 138  K 3.2*  CL 100  CO2 26  GLUCOSE 127*  BUN 18  CREATININE 0.67  CALCIUM 9.1  GFRNONAA >60  ANIONGAP 12    No results for input(s): PROT, ALBUMIN, AST, ALT, ALKPHOS, BILITOT in the  last 168 hours. Lipids No results for input(s): CHOL, TRIG, HDL, LABVLDL, LDLCALC, CHOLHDL in the last 168 hours.  Hematology Recent Labs  Lab 10/26/21 1402  WBC 11.5*  RBC 4.40  HGB 12.7   HCT 39.5  MCV 89.8  MCH 28.9  MCHC 32.2  RDW 14.4  PLT 158   Thyroid  Lab Results  Component Value Date   TSH 1.056 10/26/2021     BNP Recent Labs  Lab 10/26/21 1530  BNP 764.6*    DDimer No results for input(s): DDIMER in the last 168 hours.   Radiology/Studies:  DG Chest 2 View  Result Date: 10/26/2021 CLINICAL DATA:  Shortness of breath EXAM: CHEST - 2 VIEW COMPARISON:  Chest radiograph 06/24/2021 FINDINGS: The heart is mildly enlarged, unchanged. The upper mediastinal contours are stable. There are coarsened interstitial markings bilaterally, similar to the prior study and likely chronic. There is patchy opacity in the right lower lobe not present on the prior study. There is no significant pleural effusion. There is no pneumothorax. There is a moderate size hiatal hernia, unchanged. There is no acute osseous abnormality. There is unchanged compression deformity of an upper lumbar vertebral body. IMPRESSION: 1. Patchy opacity in the right lower lobe not present on the prior study could reflect infection or aspiration. 2. Moderate-sized hiatal hernia. Electronically Signed   By: Lesia HausenPeter  Noone M.D.   On: 10/26/2021 14:53     Assessment and Plan:   Atrial flutter, RVR at times - HR is mostly controlled - feels the palps but is otherwise asymptomatic - says episodes of atrial fib have not changed in duration or frequency - is compliant with bid Eliquis 2.5 mg - continue home Lopressor 50 mg bid  2. PNA - ABX and management per IM  3. Hypokalemia - got 40 meq Kdur today - recheck ordered for am.   Risk Assessment/Risk Scores:     CHA2DS2-VASc Score = 4   This indicates a 4.8% annual risk of stroke. The patient's score is based upon: CHF History: 0 HTN History: 1 Diabetes History: 0 Stroke History: 0 Vascular Disease History: 0 Age Score: 2 Gender Score: 1    For questions or updates, please contact [facility] Please consult [email or web address] for contact  info under    Signed, Theodore DemarkRhonda Chieko Neises, PA-C  10/26/2021 5:22 PM

## 2021-10-27 ENCOUNTER — Inpatient Hospital Stay (HOSPITAL_COMMUNITY): Payer: Medicare Other

## 2021-10-27 DIAGNOSIS — J189 Pneumonia, unspecified organism: Secondary | ICD-10-CM

## 2021-10-27 DIAGNOSIS — I5031 Acute diastolic (congestive) heart failure: Secondary | ICD-10-CM

## 2021-10-27 LAB — ECHOCARDIOGRAM COMPLETE
AR max vel: 2.46 cm2
AV Area VTI: 2.4 cm2
AV Area mean vel: 2.3 cm2
AV Mean grad: 2 mmHg
AV Peak grad: 4.5 mmHg
Ao pk vel: 1.06 m/s
Area-P 1/2: 3.16 cm2
MV VTI: 1.57 cm2
P 1/2 time: 432 msec
S' Lateral: 2 cm

## 2021-10-27 LAB — LIPID PANEL
Cholesterol: 103 mg/dL (ref 0–200)
HDL: 41 mg/dL (ref >40)
LDL Cholesterol: 48 mg/dL (ref 0–99)
Total CHOL/HDL Ratio: 2.5 RATIO
Triglycerides: 69 mg/dL (ref <150)
VLDL: 14 mg/dL (ref 0–40)

## 2021-10-27 LAB — BASIC METABOLIC PANEL
Anion gap: 8 (ref 5–15)
BUN: 16 mg/dL (ref 8–23)
CO2: 27 mmol/L (ref 22–32)
Calcium: 9.4 mg/dL (ref 8.9–10.3)
Chloride: 106 mmol/L (ref 98–111)
Creatinine, Ser: 0.68 mg/dL (ref 0.44–1.00)
GFR, Estimated: >60 mL/min (ref >60)
Glucose, Bld: 106 mg/dL — ABNORMAL HIGH (ref 70–99)
Potassium: 5 mmol/L (ref 3.5–5.1)
Sodium: 141 mmol/L (ref 135–145)

## 2021-10-27 LAB — CBC
HCT: 41 % (ref 36.0–46.0)
Hemoglobin: 13.4 g/dL (ref 12.0–15.0)
MCH: 29.5 pg (ref 26.0–34.0)
MCHC: 32.7 g/dL (ref 30.0–36.0)
MCV: 90.1 fL (ref 80.0–100.0)
Platelets: 163 10*3/uL (ref 150–400)
RBC: 4.55 MIL/uL (ref 3.87–5.11)
RDW: 14.6 % (ref 11.5–15.5)
WBC: 8.5 10*3/uL (ref 4.0–10.5)
nRBC: 0 % (ref 0.0–0.2)

## 2021-10-27 LAB — STREP PNEUMONIAE URINARY ANTIGEN: Strep Pneumo Urinary Antigen: NEGATIVE

## 2021-10-27 MED ORDER — POLYETHYLENE GLYCOL 3350 17 G PO PACK
17.0000 g | PACK | Freq: Once | ORAL | Status: AC
  Administered 2021-10-27: 17 g via ORAL
  Filled 2021-10-27: qty 1

## 2021-10-27 NOTE — Progress Notes (Signed)
HPI: Laurie Pugh is a 88 y.o. female with medical history significant of chronic a-flutter, HTN, chronic back pain on narcotics, presented with cough and increasing shortness of breath.  Symptoms started 2 days ago, patient developed occasional dry cough and new onset of shortness of breath.  Denies any chest pains.  She also complained about episodic palpitations.  On further asking, she reported that she has had frequent palpitations " for a while", some episodes can be as frequent as 2-3 times a week and lasted as long as 1 day.  She denies any fever or chills, and cough has been dry.  She usually only sleep 3 hours a day, and she denied orthopnea.  She denied urinary symptoms no diarrhea.  She reported that her PCP has been doing her A. fib medications and she has not seen a cardiologist for several years.  At baseline, patient lives by herself, she eats regular food and no history of choking or cough after eating.  ED Course: Patient was found in and out uncontrolled a flutter.  Blood pressure remained stable, O2 saturation 89% on room air on arrival, stabilized on 2 L.  Chest x-ray suspicious for right lower field.  Subjective Feeling less short of breath.  Physical Exam: Vitals:   10/27/21 0405 10/27/21 0500 10/27/21 0600 10/27/21 0800  BP: (!) 160/87 98/64 113/63 140/82  Pulse: 86 70 69 84  Resp: (!) 27 (!) 26 (!) 25 (!) 25  Temp:  98.5 F (36.9 C)    TempSrc:  Oral    SpO2: 95% 97% 98% 98%    Constitutional: NAD, calm, comfortable Vitals:   10/27/21 0405 10/27/21 0500 10/27/21 0600 10/27/21 0800  BP: (!) 160/87 98/64 113/63 140/82  Pulse: 86 70 69 84  Resp: (!) 27 (!) 26 (!) 25 (!) 25  Temp:  98.5 F (36.9 C)    TempSrc:  Oral    SpO2: 95% 97% 98% 98%   Eyes: PERRL, lids and conjunctivae normal ENMT: Mucous membranes are moist. Posterior pharynx clear of any exudate or lesions.Normal dentition.  Neck: normal, supple, no masses, no thyromegaly Respiratory: clear  to auscultation bilaterally, no wheezing, No accessory muscle use.  Cardiovascular: Irregular rate, no murmurs / rubs / gallops. No extremity edema. 2+ pedal pulses. No carotid bruits.  Abdomen: no tenderness, no masses palpated. No hepatosplenomegaly. Bowel sounds positive.  Musculoskeletal: no clubbing / cyanosis. No joint deformity upper and lower extremities. Good ROM, no contractures. Normal muscle tone.  Skin: no rashes, lesions, ulcers. No induration Neurologic: CN 2-12 grossly intact. Sensation intact, DTR normal. Strength 5/5 in all 4.  Psychiatric: Normal judgment and insight. Alert and oriented x 3. Normal mood.     Labs on Admission: I have personally reviewed following labs and imaging studies  CBC: Recent Labs  Lab 10/26/21 1402 10/27/21 0400  WBC 11.5* 8.5  HGB 12.7 13.4  HCT 39.5 41.0  MCV 89.8 90.1  PLT 158 [id number]   Basic Metabolic Panel: Recent Labs  Lab 10/26/21 1402 10/27/21 0400  NA 138 141  K 3.2* 5.0  CL 100 106  CO2 26 27  GLUCOSE 127* 106*  BUN 18 16  CREATININE 0.67 0.68  CALCIUM 9.1 9.4   GFR: CrCl cannot be calculated (Unknown ideal weight.). Liver Function Tests: No results for input(s): AST, ALT, ALKPHOS, BILITOT, PROT, ALBUMIN in the last 168 hours. No results for input(s): LIPASE, AMYLASE in the last 168 hours. No results for input(s): AMMONIA in the last  168 hours. Coagulation Profile: Recent Labs  Lab 10/26/21 1402  INR 1.3*   Cardiac Enzymes: No results for input(s): CKTOTAL, CKMB, CKMBINDEX, TROPONINI in the last 168 hours. BNP (last 3 results) No results for input(s): PROBNP in the last 8760 hours. HbA1C: No results for input(s): HGBA1C in the last 72 hours. CBG: No results for input(s): GLUCAP in the last 168 hours. Lipid Profile: Recent Labs    10/27/21 0400  CHOL 103  HDL 41  LDLCALC 48  TRIG 69  CHOLHDL 2.5   Thyroid Function Tests: Recent Labs    10/26/21 1550  TSH 1.056   Anemia Panel: No results for  input(s): VITAMINB12, FOLATE, FERRITIN, TIBC, IRON, RETICCTPCT in the last 72 hours. Urine analysis:    Component Value Date/Time   COLORURINE YELLOW 06/24/2021 1130   APPEARANCEUR CLOUDY (A) 06/24/2021 1130   LABSPEC 1.013 06/24/2021 1130   PHURINE 6.0 06/24/2021 1130   GLUCOSEU NEGATIVE 06/24/2021 1130   HGBUR LARGE (A) 06/24/2021 1130   BILIRUBINUR NEGATIVE 06/24/2021 1130   KETONESUR NEGATIVE 06/24/2021 1130   PROTEINUR >300 (A) 06/24/2021 1130   NITRITE POSITIVE (A) 06/24/2021 1130   LEUKOCYTESUR LARGE (A) 06/24/2021 1130    Radiological Exams on Admission: DG Chest 2 View  Result Date: 10/26/2021 CLINICAL DATA:  Shortness of breath EXAM: CHEST - 2 VIEW COMPARISON:  Chest radiograph 06/24/2021 FINDINGS: The heart is mildly enlarged, unchanged. The upper mediastinal contours are stable. There are coarsened interstitial markings bilaterally, similar to the prior study and likely chronic. There is patchy opacity in the right lower lobe not present on the prior study. There is no significant pleural effusion. There is no pneumothorax. There is a moderate size hiatal hernia, unchanged. There is no acute osseous abnormality. There is unchanged compression deformity of an upper lumbar vertebral body. IMPRESSION: 1. Patchy opacity in the right lower lobe not present on the prior study could reflect infection or aspiration. 2. Moderate-sized hiatal hernia. Electronically Signed   By: Valetta Mole M.D.   On: 10/26/2021 14:53    EKG: Independently reviewed.  A flutter with QTC prolongation  Assessment/Plan Principal Problem:   CHF (congestive heart failure) (HCC) Active Problems:   Atrial fibrillation (HCC)   Acute diastolic CHF decompensation -Likely secondary to uncontrolled a flutter -Continue current rate control medication from atenolol 25 mg daily to metoprolol 50 mg twice daily.  Continue Eliquis. -Repeat echocardiogram, patient has a abnormal CT abdomen/chest in September 2022  which showed cardiomegaly and biventricular dilation, liver congestion all implying a CHF. D/W cardiology NP, cardiology will see the patient -Lasix 20 mg IV twice daily  Right-sided pneumonia -Will treated as CAP, but unlikely this CAP causing all her breathing symptoms -Will also consult speech. -Continue Doxy BID.  A flutter, chronic, poorly controlled -Rate control as above, continue Eliquis.  HTN -Continue ARB, hold chlorthalidone while on Lasix.  Metoprolol replaced atenolol.  Question of COPD -Hx of ex-smoker -There was a abnormal CT chest last year showing COPD like changes -Not much wheezing on exam, continue Atrovent nebulizer and as needed, add Pulmicort. -Outpatient pulm function test.  Hypokalemia -P.o. replacement and recheck level tomorrow.  Send magnesium level.  Prolonged QTC -Use Doxycycline for PNA.  Likely discharge in the next 24 to 48 hours  Siarah Deleo A MD [facility]  10/27/2021, 11:17 AM

## 2021-10-27 NOTE — Progress Notes (Signed)
Heart Failure Nurse Navigator Progress Note  Navigation team following this hospitalization.   Echo ordered 10/27/21, will await results.  Meredith Staggers, RN, BSN, [facility] HF Navigator//Specialty Coordinator [facility]

## 2021-10-27 NOTE — ED Notes (Signed)
RT at bedside, Sp02 decreased to 87%, NAD. Patient denies any discomfort. RT placed patient back on supplemental 02 via [place] at 1L.

## 2021-10-27 NOTE — ED Notes (Signed)
Patient in bed, resting comfortably. Sp02 at 97-100% on 3L via . Informed patient of weaning supplemental o2, patient is on RA, instructed to call for help if feels short of breathe, and or unable to tolerate

## 2021-10-27 NOTE — Progress Notes (Signed)
DAILY PROGRESS NOTE   Patient Name: Laurie Pugh Date of Encounter: 10/27/2021 Cardiologist: Pixie Casino, MD  Chief Complaint   Feels better today  Patient Profile   Laurie Pugh is a 88 y.o. female with a hx of PAF on Eliquis, PSVT, HTN, HLD,  who is being seen 10/26/2021 for the evaluation of arrhythmia, at the request of Dr Roosevelt Locks.  Subjective   She notes her breathing is better today - urine output not recorded. On lasix for diastolic HF. Remains in rate-controlled afib.  Objective   Vitals:   10/27/21 0405 10/27/21 0500 10/27/21 0600 10/27/21 0800  BP: (!) 160/87 98/64 113/63 140/82  Pulse: 86 70 69 84  Resp: (!) 27 (!) 26 (!) 25 (!) 25  Temp:  98.5 F (36.9 C)    TempSrc:  Oral    SpO2: 95% 97% 98% 98%   No intake or output data in the 24 hours ending 10/27/21 0912 There were no vitals filed for this visit.  Physical Exam   General appearance: alert and no distress Neck: no carotid bruit, no JVD, and thyroid not enlarged, symmetric, no tenderness/mass/nodules Lungs: clear to auscultation bilaterally Heart: irregularly irregular rhythm Abdomen: soft, non-tender; bowel sounds normal; no masses,  no organomegaly Extremities: extremities normal, atraumatic, no cyanosis or edema and varicose veins noted Pulses: 2+ and symmetric Skin: Skin color, texture, turgor normal. No rashes or lesions Neurologic: Grossly normal Psych: Pleasant  Inpatient Medications    Scheduled Meds:  apixaban  2.5 mg Oral BID   benazepril  20 mg Oral Daily   budesonide (PULMICORT) nebulizer solution  0.25 mg Nebulization BID   doxycycline  100 mg Oral Q12H   feeding supplement  237 mL Oral BID BM   furosemide  20 mg Intravenous Daily   guaiFENesin  1,200 mg Oral BID   loratadine  10 mg Oral Daily   metoprolol tartrate  50 mg Oral BID   pantoprazole  40 mg Oral Daily   rosuvastatin  10 mg Oral Daily    Continuous Infusions:   PRN Meds: ipratropium, LORazepam,  oxyCODONE-acetaminophen   Labs   Results for orders placed or performed during the hospital encounter of 10/26/21 (from the past 48 hour(s))  Resp Panel by RT-PCR (Flu A&B, Covid) Nasopharyngeal Swab     Status: None   Collection Time: 10/26/21  2:00 PM   Specimen: Nasopharyngeal Swab; Nasopharyngeal(NP) swabs in vial transport medium  Result Value Ref Range   SARS Coronavirus 2 by RT PCR NEGATIVE NEGATIVE    Comment: (NOTE) SARS-CoV-2 target nucleic acids are NOT DETECTED.  The SARS-CoV-2 RNA is generally detectable in upper respiratory specimens during the acute phase of infection. The lowest concentration of SARS-CoV-2 viral copies this assay can detect is 138 copies/mL. A negative result does not preclude SARS-Cov-2 infection and should not be used as the sole basis for treatment or other patient management decisions. A negative result may occur with  improper specimen collection/handling, submission of specimen other than nasopharyngeal swab, presence of viral mutation(s) within the areas targeted by this assay, and inadequate number of viral copies(<138 copies/mL). A negative result must be combined with clinical observations, patient history, and epidemiological information. The expected result is Negative.  Fact Sheet for Patients:  EntrepreneurPulse.com.au  Fact Sheet for Healthcare Providers:  IncredibleEmployment.be  This test is no t yet approved or cleared by the [place] FDA and  has been authorized for detection and/or diagnosis of SARS-CoV-2 by FDA under  an Emergency Use Authorization (EUA). This EUA will remain  in effect (meaning this test can be used) for the duration of the COVID-19 declaration under Section 564(b)(1) of the Act, 21 U.S.C.section 360bbb-3(b)(1), unless the authorization is terminated  or revoked sooner.       Influenza A by PCR NEGATIVE NEGATIVE   Influenza B by PCR NEGATIVE NEGATIVE    Comment:  (NOTE) The Xpert Xpress SARS-CoV-2/FLU/RSV plus assay is intended as an aid in the diagnosis of influenza from Nasopharyngeal swab specimens and should not be used as a sole basis for treatment. Nasal washings and aspirates are unacceptable for Xpert Xpress SARS-CoV-2/FLU/RSV testing.  Fact Sheet for Patients: EntrepreneurPulse.com.au  Fact Sheet for Healthcare Providers: IncredibleEmployment.be  This test is not yet approved or cleared by the [place] FDA and has been authorized for detection and/or diagnosis of SARS-CoV-2 by FDA under an Emergency Use Authorization (EUA). This EUA will remain in effect (meaning this test can be used) for the duration of the COVID-19 declaration under Section 564(b)(1) of the Act, 21 U.S.C. section 360bbb-3(b)(1), unless the authorization is terminated or revoked.  Performed at [facility], [place] [address], [place], [place] [id number]   Basic metabolic panel     Status: Abnormal   Collection Time: 10/26/21  2:02 PM  Result Value Ref Range   Sodium 138 135 - 145 mmol/L   Potassium 3.2 (L) 3.5 - 5.1 mmol/L   Chloride 100 98 - 111 mmol/L   CO2 26 22 - 32 mmol/L   Glucose, Bld 127 (H) 70 - 99 mg/dL    Comment: Glucose reference range applies only to samples taken after fasting for at least 8 hours.   BUN 18 8 - 23 mg/dL   Creatinine, Ser 0.67 0.44 - 1.00 mg/dL   Calcium 9.1 8.9 - 10.3 mg/dL   GFR, Estimated >60 >60 mL/min    Comment: (NOTE) Calculated using the CKD-EPI Creatinine Equation (2021)    Anion gap 12 5 - 15    Comment: Performed at [facility] [address], [place], [place] [address]  CBC     Status: Abnormal   Collection Time: 10/26/21  2:02 PM  Result Value Ref Range   WBC 11.5 (H) 4.0 - 10.5 K/uL   RBC 4.40 3.87 - 5.11 MIL/uL   Hemoglobin 12.7 12.0 - 15.0 g/dL   HCT 39.5 36.0 - 46.0 %   MCV 89.8 80.0 - 100.0 fL   MCH 28.9 26.0 - 34.0 pg   MCHC 32.2 30.0 - 36.0 g/dL    RDW 14.4 11.5 - 15.5 %   Platelets 158 150 - 400 K/uL   nRBC 0.0 0.0 - 0.2 %    Comment: Performed at [facility], [place] [address] [place],  [address]  Protime-INR- (order if Patient is taking Coumadin / Warfarin)     Status: Abnormal   Collection Time: 10/26/21  2:02 PM  Result Value Ref Range   Prothrombin Time 16.1 (H) 11.4 - 15.2 seconds   INR 1.3 (H) 0.8 - 1.2    Comment: (NOTE) INR goal varies based on device and disease states. Performed at [facility], [place] [address] [place], [place] [address]   Lactic acid, plasma     Status: None   Collection Time: 10/26/21  3:30 PM  Result Value Ref Range   Lactic Acid, Venous 1.1 0.5 - 1.9 mmol/L    Comment: Performed at [facility],  [address], [place], [place] [address]  Brain natriuretic peptide     Status: Abnormal   Collection Time: 10/26/21  3:30 PM  Result Value Ref Range   B Natriuretic Peptide 764.6 (H) 0.0 - 100.0 pg/mL    Comment: Performed at [facility] [address] [place], [place] [address]  TSH     Status: None   Collection Time: 10/26/21  3:50 PM  Result Value Ref Range   TSH 1.056 0.350 - 4.500 uIU/mL    Comment: Performed by a 3rd Generation assay with a functional sensitivity of <=0.01 uIU/mL. Performed at [facility], [place] [address], [place], [place] [id number]   Basic metabolic panel     Status: Abnormal   Collection Time: 10/27/21  4:00 AM  Result Value Ref Range   Sodium 141 135 - 145 mmol/L   Potassium 5.0 3.5 - 5.1 mmol/L    Comment: NO VISIBLE HEMOLYSIS   Chloride 106 98 - 111 mmol/L   CO2 27 22 - 32 mmol/L   Glucose, Bld 106 (H) 70 - 99 mg/dL    Comment: Glucose reference range applies only to samples taken after fasting for at least 8 hours.   BUN 16 8 - 23 mg/dL   Creatinine, Ser 0.68 0.44 - 1.00 mg/dL   Calcium 9.4 8.9 - 10.3 mg/dL   GFR, Estimated >60 >60 mL/min    Comment: (NOTE) Calculated using the CKD-EPI Creatinine Equation (2021)     Anion gap 8 5 - 15    Comment: Performed at [facility] [address] [place], [place] [address]  Lipid panel     Status: None   Collection Time: 10/27/21  4:00 AM  Result Value Ref Range   Cholesterol 103 0 - 200 mg/dL   Triglycerides 69 <150 mg/dL   HDL 41 >40 mg/dL   Total CHOL/HDL Ratio 2.5 RATIO   VLDL 14 0 - 40 mg/dL   LDL Cholesterol 48 0 - 99 mg/dL    Comment:        Total Cholesterol/HDL:CHD Risk Coronary Heart Disease Risk Table                     Men   Women  1/2 Average Risk   3.4   3.3  Average Risk       5.0   4.4  2 X Average Risk   9.6   7.1  3 X Average Risk  23.4   11.0        Use the calculated Patient Ratio above and the CHD Risk Table to determine the patient's CHD Risk.        ATP III CLASSIFICATION (LDL):  <100     mg/dL   Optimal  100-129  mg/dL   Near or Above                    Optimal  130-159  mg/dL   Borderline  160-189  mg/dL   High  >190     mg/dL   Very High Performed at [facility] [address] [place] [address]   CBC     Status: None   Collection Time: 10/27/21  4:00 AM  Result Value Ref Range   WBC 8.5 4.0 - 10.5 K/uL   RBC 4.55 3.87 - 5.11 MIL/uL   Hemoglobin 13.4 12.0 - 15.0 g/dL   HCT 41.0 36.0 - 46.0 %  MCV 90.1 80.0 - 100.0 fL   MCH 29.5 26.0 - 34.0 pg   MCHC 32.7 30.0 - 36.0 g/dL   RDW 14.6 11.5 - 15.5 %   Platelets 163 150 - 400 K/uL   nRBC 0.0 0.0 - 0.2 %    Comment: Performed at [facility], [place] [address] [place], [place] [address]    ECG   AFib with PVC's at 77 - Personally Reviewed  Telemetry   AFib - Personally Reviewed  Radiology    DG Chest 2 View  Result Date: 10/26/2021 CLINICAL DATA:  Shortness of breath EXAM: CHEST - 2 VIEW COMPARISON:  Chest radiograph 06/24/2021 FINDINGS: The heart is mildly enlarged, unchanged. The upper mediastinal contours are stable. There are coarsened interstitial markings bilaterally, similar to the prior study and likely chronic. There  is patchy opacity in the right lower lobe not present on the prior study. There is no significant pleural effusion. There is no pneumothorax. There is a moderate size hiatal hernia, unchanged. There is no acute osseous abnormality. There is unchanged compression deformity of an upper lumbar vertebral body. IMPRESSION: 1. Patchy opacity in the right lower lobe not present on the prior study could reflect infection or aspiration. 2. Moderate-sized hiatal hernia. Electronically Signed   By: Valetta Mole M.D.   On: 10/26/2021 14:53    Cardiac Studies   Echo pending  Assessment   Principal Problem:   CHF (congestive heart failure) ([place]) Active Problems:   Atrial fibrillation ([place])   Plan   Seems to be diuresing with IV lasix - weights or I's/O's not recorded in the ER. Will make sure this is ordered - echo today. On treatment for possible PNA per hospital medicine with doxycycline.  Time Spent Directly with Patient:  I have spent a total of 25 minutes with the patient reviewing hospital notes, telemetry, EKGs, labs and examining the patient as well as establishing an assessment and plan that was discussed personally with the patient.  > 50% of time was spent in direct patient care.  Length of Stay:  LOS: 1 day   Pixie Casino, MD, [facility], [place] Director of the Advanced Lipid Disorders &  Cardiovascular Risk Reduction Clinic Diplomate of the American Board of Clinical Lipidology Attending Cardiologist  Direct Dial: [phone]   Fax: [phone]  Website:  [email or web address]  Nadean Corwin Carmello [place]iness 10/27/2021, 9:12 AM

## 2021-10-27 NOTE — Progress Notes (Signed)
Echocardiogram 2D Echocardiogram has been performed.  Laurie Pugh 10/27/2021, 2:29 PM

## 2021-10-27 NOTE — ED Notes (Signed)
Daughter Rosey Bath updated via phone regarding patient admit disposition and care.

## 2021-10-27 NOTE — Evaluation (Signed)
Clinical/Bedside Swallow Evaluation Patient Details  Name: Laurie Pugh MRN: [id number] Date of Birth: 01/15/1932  Today's Date: 10/27/2021 Time: SLP Start Time (ACUTE ONLY): 1145 SLP Stop Time (ACUTE ONLY): 1208 SLP Time Calculation (min) (ACUTE ONLY): 23 min  Past Medical History:  Past Medical History:  Diagnosis Date   Allergy    Angina pectoris, unstable (HCC)    Cataract    Dyslipidemia    Esophageal stricture    GERD (gastroesophageal reflux disease)    Hyperlipidemia    Hypertension    Neck pain    PAF (paroxysmal atrial fibrillation) (HCC)    PSVT (paroxysmal supraventricular tachycardia) (HCC)    Past Surgical History:  Past Surgical History:  Procedure Laterality Date   ABDOMINAL HYSTERECTOMY     APPENDECTOMY     BLADDER SURGERY     bladder surgery   BREAST EXCISIONAL BIOPSY     CARDIAC CATHETERIZATION  06/28/2004   no significant CAD (Dr. Laurell Josephs)   CATARACT EXTRACTION, BILATERAL  2008   CYSTOSCOPY W/ RETROGRADES Bilateral 04/11/2016   Procedure: CYSTOSCOPY WITH BILATERAL RETROGRADE PYELOGRAM;  Surgeon: Hildred Laser, MD;  Location: [facility];  Service: Urology;  Laterality: Bilateral;   ESOPHAGOGASTRODUODENOSCOPY N/A 01/30/2017   Procedure: ESOPHAGOGASTRODUODENOSCOPY (EGD);  Surgeon: Hilarie Fredrickson, MD;  Location: [facility];  Service: Endoscopy;  Laterality: N/A;   ESOPHAGOGASTRODUODENOSCOPY N/A 01/30/2017   Procedure: ESOPHAGOGASTRODUODENOSCOPY (EGD);  Surgeon: Napoleon Form, MD;  Location: [facility];  Service: Endoscopy;  Laterality: N/A;   IR VERTEBROPLASTY LUMBAR BX INC UNI/BIL INC/INJECT/IMAGING  03/15/2019   TRANSTHORACIC ECHOCARDIOGRAM  2009   borderline conc LVH; trace MR; mild-mod TR, RVSP 30-44mmHg   HPI:  Laurie Pugh is a 88 y.o. female with medical history significant of chronic a-flutter, HTN, chronic back pain on narcotics, presented with cough and increasing shortness of breath.     Symptoms started 2 days ago, patient  developed occasional dry cough and new onset of shortness of breath.  Denies any chest pains.  She also complained about episodic palpitations.  On further asking, she reported that she has had frequent palpitations " for a while", some episodes can be as frequent as 2-3 times a week and lasted as long as 1 day.  She denies any fever or chills, and cough has been dry.  She usually only sleep 3 hours a day, and she denied orthopnea.  She denied urinary symptoms no diarrhea.  She reported that her PCP has been doing her A. fib medications and she has not seen a cardiologist for several years.  At baseline, patient lives by herself, she eats regular food and no history of choking or cough after eating; CXR revealed on 10/26/21 Patchy opacity in the right lower lobe not present on the prior  study could reflect infection or aspiration.  2. Moderate-sized hiatal hernia; pt reports hx of GERD, but controlled with medications.  BSE ordered.    Assessment / Plan / Recommendation  Clinical Impression  Pt seen for clinical swallowing evaluation with normal oropharyngeal swallow noted with all consistencies assessed.  Pt exhibited a timely swallow, adequate oral preparation/propulsion and no overt s/s of aspiration noted throughout BSE.  Recommend continuing Regular/thin liquid diet with general swallowing precautions including slow rate/small bites/sips and esophageal precautions prn.  ST will s/o at this time.  Thank you for this consult. SLP Visit Diagnosis: Dysphagia, unspecified (R13.10)    Aspiration Risk  No limitations    Diet Recommendation   Regular/thin liquids  Medication Administration: Whole meds with liquid    Other  Recommendations Oral Care Recommendations: Patient independent with oral care    Recommendations for follow up therapy are one component of a multi-disciplinary discharge planning process, led by the attending physician.  Recommendations may be updated based on patient status,  additional functional criteria and insurance authorization.  Follow up Recommendations No SLP follow up      Assistance Recommended at Discharge Intermittent Supervision/Assistance  Functional Status Assessment    Frequency and Duration  (evaluation only)          Prognosis Prognosis for Safe Diet Advancement: Good      Swallow Study   General Date of Onset: 10/26/21 HPI: Laurie Pugh is a 88 y.o. female with medical history significant of chronic a-flutter, HTN, chronic back pain on narcotics, presented with cough and increasing shortness of breath.     Symptoms started 2 days ago, patient developed occasional dry cough and new onset of shortness of breath.  Denies any chest pains.  She also complained about episodic palpitations.  On further asking, she reported that she has had frequent palpitations " for a while", some episodes can be as frequent as 2-3 times a week and lasted as long as 1 day.  She denies any fever or chills, and cough has been dry.  She usually only sleep 3 hours a day, and she denied orthopnea.  She denied urinary symptoms no diarrhea.  She reported that her PCP has been doing her A. fib medications and she has not seen a cardiologist for several years.  At baseline, patient lives by herself, she eats regular food and no history of choking or cough after eating; CXR revealed on 10/26/21 Patchy opacity in the right lower lobe not present on the prior  study could reflect infection or aspiration.  2. Moderate-sized hiatal hernia; pt reports hx of GERD, but controlled with medications.  BSE ordered. Type of Study: Bedside Swallow Evaluation Previous Swallow Assessment: n/a Diet Prior to this Study: Regular;Thin liquids Temperature Spikes Noted: No Respiratory Status: Nasal cannula (1L) History of Recent Intubation: No Behavior/Cognition: Alert;Cooperative;Pleasant mood Oral Cavity Assessment: Within Functional Limits Oral Care Completed by SLP: No Oral Cavity -  Dentition: Dentures, top;Dentures, bottom Vision: Functional for self-feeding Self-Feeding Abilities: Able to feed self Patient Positioning: Upright in bed Baseline Vocal Quality: Normal Volitional Cough: Strong Volitional Swallow: Able to elicit    Oral/Motor/Sensory Function Overall Oral Motor/Sensory Function: Within functional limits   Ice Chips Ice chips: Not tested   Thin Liquid Thin Liquid: Within functional limits Presentation: Straw    Nectar Thick Nectar Thick Liquid: Not tested   Honey Thick Honey Thick Liquid: Not tested   Puree Puree: Within functional limits Presentation: Self Fed   Solid     Solid: Within functional limits Presentation: Self Fed      Tressie Stalker, M.S., CCC-SLP 10/27/2021,12:23 PM

## 2021-10-28 LAB — MYCOPLASMA PNEUMONIAE ANTIBODY, IGM: Mycoplasma pneumo IgM: <770 U/mL (ref 0–769)

## 2021-10-28 MED ORDER — BENAZEPRIL HCL 5 MG PO TABS
10.0000 mg | ORAL_TABLET | Freq: Every day | ORAL | Status: DC
  Filled 2021-10-28: qty 2

## 2021-10-28 MED ORDER — ADULT MULTIVITAMIN W/MINERALS CH
1.0000 | ORAL_TABLET | Freq: Every day | ORAL | Status: DC
  Administered 2021-10-28 – 2021-10-29 (×2): 1 via ORAL
  Filled 2021-10-28 (×2): qty 1

## 2021-10-28 MED ORDER — FUROSEMIDE 10 MG/ML IJ SOLN
40.0000 mg | Freq: Two times a day (BID) | INTRAMUSCULAR | Status: DC
  Administered 2021-10-28 (×2): 40 mg via INTRAVENOUS
  Filled 2021-10-28 (×3): qty 4

## 2021-10-28 MED ORDER — ENSURE ENLIVE PO LIQD
237.0000 mL | Freq: Three times a day (TID) | ORAL | Status: DC
  Administered 2021-10-28 – 2021-10-29 (×2): 237 mL via ORAL

## 2021-10-28 NOTE — Progress Notes (Signed)
SATURATION QUALIFICATIONS: (This note is used to comply with regulatory documentation for home oxygen)  Patient Saturations on Room Air at Rest = 95%  Patient Saturations on Room Air while Ambulating = 87%  Patient Saturations on 1 Liters of oxygen while Ambulating = 90%  Please briefly explain why patient needs home oxygen:PT requires supplemental oxygen with activity to maintain sats >90% Merryl Hacker, PT Acute Rehabilitation Services Pager: [phone] Office: [phone]

## 2021-10-28 NOTE — Progress Notes (Signed)
DAILY PROGRESS NOTE   Patient Name: Laurie Pugh Date of Encounter: 10/28/2021 Cardiologist: Pixie Casino, MD  Chief Complaint   Feels better today  Patient Profile   Laurie Pugh is a 88 y.o. female with a hx of PAF on Eliquis, PSVT, HTN, HLD,  who is being seen 10/26/2021 for the evaluation of arrhythmia, at the request of Dr Roosevelt Locks.  Subjective   Recorded 410 cc negative - weight 42.4 kg. LDL 48. Repeat echo yesterday shows LVEF 60-65%, grade 3 DD (but she is in permanent afib and there is no active a-wave, so I suspect that may not be accurate), high LA pressure, moderate pulmonary hypertension, findings suggestive of acute on chronic diastolic congestive heart failure.  Objective   Vitals:   10/28/21 0007 10/28/21 0606 10/28/21 0723 10/28/21 0818  BP: 139/82 105/74    Pulse: 75 74 67 79  Resp: (!) 25 (!) 22  (!) 25  Temp: 97.6 F (36.4 C) 97.6 F (36.4 C)    TempSrc: Oral Oral    SpO2: 95% 97% 98% 97%  Weight:  42.4 kg      Intake/Output Summary (Last 24 hours) at 10/28/2021 [id number] Last data filed at 10/27/2021 2005 Gross per 24 hour  Intake 240 ml  Output 650 ml  Net -410 ml   Filed Weights   10/28/21 0606  Weight: 42.4 kg    Physical Exam   General appearance: alert and no distress Neck: JVD - a few cm above sternal notch, no carotid bruit, and thyroid not enlarged, symmetric, no tenderness/mass/nodules Lungs: clear to auscultation bilaterally Heart: irregularly irregular rhythm Abdomen: soft, non-tender; bowel sounds normal; no masses,  no organomegaly Extremities: extremities normal, atraumatic, no cyanosis or edema and varicose veins noted Pulses: 2+ and symmetric Skin: Skin color, texture, turgor normal. No rashes or lesions Neurologic: Grossly normal Psych: Pleasant  Inpatient Medications    Scheduled Meds:  apixaban  2.5 mg Oral BID   benazepril  20 mg Oral Daily   budesonide (PULMICORT) nebulizer solution  0.25 mg Nebulization BID    doxycycline  100 mg Oral Q12H   feeding supplement  237 mL Oral BID BM   furosemide  20 mg Intravenous Daily   guaiFENesin  1,200 mg Oral BID   loratadine  10 mg Oral Daily   metoprolol tartrate  50 mg Oral BID   pantoprazole  40 mg Oral Daily   rosuvastatin  10 mg Oral Daily    Continuous Infusions:   PRN Meds: ipratropium, LORazepam, oxyCODONE-acetaminophen   Labs   Results for orders placed or performed during the hospital encounter of 10/26/21 (from the past 48 hour(s))  Resp Panel by RT-PCR (Flu A&B, Covid) Nasopharyngeal Swab     Status: None   Collection Time: 10/26/21  2:00 PM   Specimen: Nasopharyngeal Swab; Nasopharyngeal(NP) swabs in vial transport medium  Result Value Ref Range   SARS Coronavirus 2 by RT PCR NEGATIVE NEGATIVE    Comment: (NOTE) SARS-CoV-2 target nucleic acids are NOT DETECTED.  The SARS-CoV-2 RNA is generally detectable in upper respiratory specimens during the acute phase of infection. The lowest concentration of SARS-CoV-2 viral copies this assay can detect is 138 copies/mL. A negative result does not preclude SARS-Cov-2 infection and should not be used as the sole basis for treatment or other patient management decisions. A negative result may occur with  improper specimen collection/handling, submission of specimen other than nasopharyngeal swab, presence of viral mutation(s) within the areas targeted  by this assay, and inadequate number of viral copies(<138 copies/mL). A negative result must be combined with clinical observations, patient history, and epidemiological information. The expected result is Negative.  Fact Sheet for Patients:  EntrepreneurPulse.com.au  Fact Sheet for Healthcare Providers:  IncredibleEmployment.be  This test is no t yet approved or cleared by the [place] FDA and  has been authorized for detection and/or diagnosis of SARS-CoV-2 by FDA under an Emergency Use  Authorization (EUA). This EUA will remain  in effect (meaning this test can be used) for the duration of the COVID-19 declaration under Section 564(b)(1) of the Act, 21 U.S.C.section 360bbb-3(b)(1), unless the authorization is terminated  or revoked sooner.       Influenza A by PCR NEGATIVE NEGATIVE   Influenza B by PCR NEGATIVE NEGATIVE    Comment: (NOTE) The Xpert Xpress SARS-CoV-2/FLU/RSV plus assay is intended as an aid in the diagnosis of influenza from Nasopharyngeal swab specimens and should not be used as a sole basis for treatment. Nasal washings and aspirates are unacceptable for Xpert Xpress SARS-CoV-2/FLU/RSV testing.  Fact Sheet for Patients: EntrepreneurPulse.com.au  Fact Sheet for Healthcare Providers: IncredibleEmployment.be  This test is not yet approved or cleared by the [place] FDA and has been authorized for detection and/or diagnosis of SARS-CoV-2 by FDA under an Emergency Use Authorization (EUA). This EUA will remain in effect (meaning this test can be used) for the duration of the COVID-19 declaration under Section 564(b)(1) of the Act, 21 U.S.C. section 360bbb-3(b)(1), unless the authorization is terminated or revoked.  Performed at [facility], [place] [address] [place], [place] [id number]   Basic metabolic panel     Status: Abnormal   Collection Time: 10/26/21  2:02 PM  Result Value Ref Range   Sodium 138 135 - 145 mmol/L   Potassium 3.2 (L) 3.5 - 5.1 mmol/L   Chloride 100 98 - 111 mmol/L   CO2 26 22 - 32 mmol/L   Glucose, Bld 127 (H) 70 - 99 mg/dL    Comment: Glucose reference range applies only to samples taken after fasting for at least 8 hours.   BUN 18 8 - 23 mg/dL   Creatinine, Ser 0.67 0.44 - 1.00 mg/dL   Calcium 9.1 8.9 - 10.3 mg/dL   GFR, Estimated >60 >60 mL/min    Comment: (NOTE) Calculated using the CKD-EPI Creatinine Equation (2021)    Anion gap 12 5 - 15    Comment: Performed at  [facility] [address], [place], [place] [address]  CBC     Status: Abnormal   Collection Time: 10/26/21  2:02 PM  Result Value Ref Range   WBC 11.5 (H) 4.0 - 10.5 K/uL   RBC 4.40 3.87 - 5.11 MIL/uL   Hemoglobin 12.7 12.0 - 15.0 g/dL   HCT 39.5 36.0 - 46.0 %   MCV 89.8 80.0 - 100.0 fL   MCH 28.9 26.0 - 34.0 pg   MCHC 32.2 30.0 - 36.0 g/dL   RDW 14.4 11.5 - 15.5 %   Platelets 158 150 - 400 K/uL   nRBC 0.0 0.0 - 0.2 %    Comment: Performed at [facility], [place] [address] [place], [place] [address]  Protime-INR- (order if Patient is taking Coumadin / Warfarin)     Status: Abnormal   Collection Time: 10/26/21  2:02 PM  Result Value Ref Range   Prothrombin Time 16.1 (H) 11.4 - 15.2 seconds   INR 1.3 (H) 0.8 -  1.2    Comment: (NOTE) INR goal varies based on device and disease states. Performed at [facility], [place] [address] [place], [place] [address]   Blood culture (routine x 2)     Status: None (Preliminary result)   Collection Time: 10/26/21  3:30 PM   Specimen: BLOOD  Result Value Ref Range   Specimen Description BLOOD BLOOD RIGHT ARM    Special Requests      BOTTLES DRAWN AEROBIC AND ANAEROBIC Blood Culture adequate volume   Culture      NO GROWTH < 24 HOURS Performed at [facility], [place] [address], [place], [place] [address]    Report Status PENDING   Lactic acid, plasma     Status: None   Collection Time: 10/26/21  3:30 PM  Result Value Ref Range   Lactic Acid, Venous 1.1 0.5 - 1.9 mmol/L    Comment: Performed at [facility] [address], [place], [place] [address]  Brain natriuretic peptide     Status: Abnormal   Collection Time: 10/26/21  3:30 PM  Result Value Ref Range   B Natriuretic Peptide 764.6 (H) 0.0 - 100.0 pg/mL    Comment: Performed at [facility] [address] [place], [place] [address]  Blood culture (routine x 2)     Status: None (Preliminary result)   Collection Time: 10/26/21  3:50 PM   Specimen:  BLOOD  Result Value Ref Range   Specimen Description BLOOD BLOOD LEFT ARM    Special Requests      BOTTLES DRAWN AEROBIC AND ANAEROBIC Blood Culture results may not be optimal due to an excessive volume of blood received in culture bottles   Culture      NO GROWTH < 24 HOURS Performed at [facility] [address] [place], [place] [address]    Report Status PENDING   TSH     Status: None   Collection Time: 10/26/21  3:50 PM  Result Value Ref Range   TSH 1.056 0.350 - 4.500 uIU/mL    Comment: Performed by a 3rd Generation assay with a functional sensitivity of <=0.01 uIU/mL. Performed at [facility], [place] [address], [place], [place] [address]   Strep pneumoniae urinary antigen     Status: None   Collection Time: 10/26/21  3:55 PM  Result Value Ref Range   Strep Pneumo Urinary Antigen NEGATIVE NEGATIVE    Comment:        Infection due to S. pneumoniae cannot be absolutely ruled out since the antigen present may be below the detection limit of the test. Performed at [facility], [address], [place], [place] [id number]   Basic metabolic panel     Status: Abnormal   Collection Time: 10/27/21  4:00 AM  Result Value Ref Range   Sodium 141 135 - 145 mmol/L   Potassium 5.0 3.5 - 5.1 mmol/L    Comment: NO VISIBLE HEMOLYSIS   Chloride 106 98 - 111 mmol/L   CO2 27 22 - 32 mmol/L   Glucose, Bld 106 (H) 70 - 99 mg/dL    Comment: Glucose reference range applies only to samples taken after fasting for at least 8 hours.   BUN 16 8 - 23 mg/dL   Creatinine, Ser 0.68 0.44 - 1.00 mg/dL   Calcium 9.4 8.9 - 10.3 mg/dL   GFR, Estimated >60 >60 mL/min    Comment: (NOTE) Calculated using the CKD-EPI Creatinine  Equation (2021)    Anion gap 8 5 - 15    Comment: Performed at [facility], [place] [address] [place], [place] [address]  Lipid panel     Status: None   Collection Time: 10/27/21  4:00 AM  Result Value Ref Range   Cholesterol 103 0 - 200 mg/dL    Triglycerides 69 <150 mg/dL   HDL 41 >40 mg/dL   Total CHOL/HDL Ratio 2.5 RATIO   VLDL 14 0 - 40 mg/dL   LDL Cholesterol 48 0 - 99 mg/dL    Comment:        Total Cholesterol/HDL:CHD Risk Coronary Heart Disease Risk Table                     Men   Women  1/2 Average Risk   3.4   3.3  Average Risk       5.0   4.4  2 X Average Risk   9.6   7.1  3 X Average Risk  23.4   11.0        Use the calculated Patient Ratio above and the CHD Risk Table to determine the patient's CHD Risk.        ATP III CLASSIFICATION (LDL):  <100     mg/dL   Optimal  100-129  mg/dL   Near or Above                    Optimal  130-159  mg/dL   Borderline  160-189  mg/dL   High  >190     mg/dL   Very High Performed at [facility] [address] [place], [place] [address]   CBC     Status: None   Collection Time: 10/27/21  4:00 AM  Result Value Ref Range   WBC 8.5 4.0 - 10.5 K/uL   RBC 4.55 3.87 - 5.11 MIL/uL   Hemoglobin 13.4 12.0 - 15.0 g/dL   HCT 41.0 36.0 - 46.0 %   MCV 90.1 80.0 - 100.0 fL   MCH 29.5 26.0 - 34.0 pg   MCHC 32.7 30.0 - 36.0 g/dL   RDW 14.6 11.5 - 15.5 %   Platelets 163 150 - 400 K/uL   nRBC 0.0 0.0 - 0.2 %    Comment: Performed at [facility], [place] [address] [place], [place] [address]    ECG   AFib with PVC's at 6 - Personally Reviewed  Telemetry   AFib - Personally Reviewed  Radiology    DG Chest 2 View  Result Date: 10/26/2021 CLINICAL DATA:  Shortness of breath EXAM: CHEST - 2 VIEW COMPARISON:  Chest radiograph 06/24/2021 FINDINGS: The heart is mildly enlarged, unchanged. The upper mediastinal contours are stable. There are coarsened interstitial markings bilaterally, similar to the prior study and likely chronic. There is patchy opacity in the right lower lobe not present on the prior study. There is no significant pleural effusion. There is no pneumothorax. There is a moderate size hiatal hernia, unchanged. There is no acute osseous abnormality. There  is unchanged compression deformity of an upper lumbar vertebral body. IMPRESSION: 1. Patchy opacity in the right lower lobe not present on the prior study could reflect infection or aspiration. 2. Moderate-sized hiatal hernia. Electronically Signed   By: Valetta Mole M.D.   On: 10/26/2021 14:53   ECHOCARDIOGRAM COMPLETE  Result Date: 10/27/2021    ECHOCARDIOGRAM REPORT   Patient Name:   [facility]  D Pugh Date of Exam: 10/27/2021 Medical Rec #:  [id number]         Height:       63.0 in Accession #:    [id number]        Weight:       101.0 lb Date of Birth:  19-Jul-1932        BSA:          1.446 m Patient Age:    58 years          BP:           113/63 mmHg Patient Gender: F                 HR:           69 bpm. Exam Location:  Inpatient Procedure: 2D Echo Indications:    Acute diastolic CHF  History:        Patient has prior history of Echocardiogram examinations, most                 recent 03/22/2021. Angina; Risk Factors:Dyslipidemia and                 Hypertension. SVT                 PAF.  Sonographer:    Arlyss Gandy Referring Phys: [id number] [place]  1. Left ventricular ejection fraction, by estimation, is 60 to 65%. The left ventricle has normal function. The left ventricle has no regional wall motion abnormalities. There is mild concentric left ventricular hypertrophy. Left ventricular diastolic parameters are consistent with Grade III diastolic dysfunction (restrictive). Elevated left atrial pressure.  2. Right ventricular systolic function is mildly reduced. The right ventricular size is normal. There is moderately elevated pulmonary artery systolic pressure.  3. Left atrial size was mildly dilated.  4. Right atrial size was moderately dilated.  5. The mitral valve is normal in structure. Trivial mitral valve regurgitation. No evidence of mitral stenosis.  6. Tricuspid valve regurgitation is moderate.  7. The aortic valve is tricuspid. Aortic valve regurgitation is mild to moderate. Aortic  valve sclerosis is present, with no evidence of aortic valve stenosis. Aortic regurgitation PHT measures 432 msec.  8. The inferior vena cava is normal in size with <50% respiratory variability, suggesting right atrial pressure of 8 mmHg. FINDINGS  Left Ventricle: Left ventricular ejection fraction, by estimation, is 60 to 65%. The left ventricle has normal function. The left ventricle has no regional wall motion abnormalities. The left ventricular internal cavity size was normal in size. There is  mild concentric left ventricular hypertrophy. Left ventricular diastolic parameters are consistent with Grade III diastolic dysfunction (restrictive). Elevated left atrial pressure. Right Ventricle: The right ventricular size is normal. No increase in right ventricular wall thickness. Right ventricular systolic function is mildly reduced. There is moderately elevated pulmonary artery systolic pressure. The tricuspid regurgitant velocity is 3.44 m/s, and with an assumed right atrial pressure of 8 mmHg, the estimated right ventricular systolic pressure is [id number] mmHg. Left Atrium: Left atrial size was mildly dilated. Right Atrium: Right atrial size was moderately dilated. Pericardium: There is no evidence of pericardial effusion. Mitral Valve: The mitral valve is normal in structure. Mild mitral annular calcification. Trivial mitral valve regurgitation. No evidence of mitral valve stenosis. MV peak gradient, 10.7 mmHg. The mean mitral valve gradient is 4.0 mmHg. Tricuspid Valve: The tricuspid valve is normal in structure. Tricuspid valve regurgitation is moderate . No  evidence of tricuspid stenosis. There is mild prolapse of the tricuspid anterior and septal leaflets. Aortic Valve: The aortic valve is tricuspid. Aortic valve regurgitation is mild to moderate. Aortic regurgitation PHT measures 432 msec. Aortic valve sclerosis is present, with no evidence of aortic valve stenosis. Aortic valve mean gradient measures 2.0  mmHg.  Aortic valve peak gradient measures 4.5 mmHg. Aortic valve area, by VTI measures 2.40 cm. Pulmonic Valve: The pulmonic valve was not well visualized. Pulmonic valve regurgitation is not visualized. No evidence of pulmonic stenosis. Aorta: The aortic root is normal in size and structure. Venous: The inferior vena cava is normal in size with less than 50% respiratory variability, suggesting right atrial pressure of 8 mmHg. IAS/Shunts: No atrial level shunt detected by color flow Doppler.  LEFT VENTRICLE PLAX 2D LVIDd:         2.70 cm   Diastology LVIDs:         2.00 cm   LV e' medial:    6.31 cm/s LV PW:         1.00 cm   LV E/e' medial:  18.9 LV IVS:        1.10 cm   LV e' lateral:   6.96 cm/s LVOT diam:     1.90 cm   LV E/e' lateral: 17.1 LV SV:         45 LV SV Index:   31 LVOT Area:     2.84 cm  RIGHT VENTRICLE            IVC RV Basal diam:  4.00 cm    IVC diam: 1.90 cm RV Mid diam:    3.40 cm RV S prime:     7.94 cm/s TAPSE (M-mode): 1.2 cm LEFT ATRIUM             Index        RIGHT ATRIUM           Index LA diam:        3.80 cm 2.63 cm/m   RA Area:     22.10 cm LA Vol (A2C):   62.0 ml 42.87 ml/m  RA Volume:   64.90 ml  44.87 ml/m LA Vol (A4C):   49.1 ml 33.95 ml/m LA Biplane Vol: 56.8 ml 39.27 ml/m  AORTIC VALVE AV Area (Vmax):    2.46 cm AV Area (Vmean):   2.30 cm AV Area (VTI):     2.40 cm AV Vmax:           106.00 cm/s AV Vmean:          72.700 cm/s AV VTI:            0.188 m AV Peak Grad:      4.5 mmHg AV Mean Grad:      2.0 mmHg LVOT Vmax:         91.90 cm/s LVOT Vmean:        59.100 cm/s LVOT VTI:          0.159 m LVOT/AV VTI ratio: 0.85 AI PHT:            432 msec  AORTA Ao Root diam: 3.40 cm Ao Asc diam:  3.30 cm MITRAL VALVE                TRICUSPID VALVE MV Area (PHT): 3.16 cm     TR Peak grad:   47.3 mmHg MV Area VTI:   1.57 cm     TR Vmax:  344.00 cm/s MV Peak grad:  10.7 mmHg MV Mean grad:  4.0 mmHg     SHUNTS MV Vmax:       1.64 m/s     Systemic VTI:  0.16 m MV Vmean:      87.4  cm/s    Systemic Diam: 1.90 cm MV Decel Time: 240 msec MV E velocity: 119.00 cm/s Kardie Tobb DO Electronically signed by Berniece Salines DO Signature Date/Time: 10/27/2021/6:19:21 PM    Final     Cardiac Studies   See above  Assessment   Principal Problem:   CHF (congestive heart failure) (HCC) Active Problems:   Atrial fibrillation (HCC)   Plan   Minimally net negative - echo suggests grade 3 DD with high LV filling pressure (although suspect of this since she is in afib), I do think she is still volume overloaded with moderate pulmonary hypertension. She needs more diuresis. Will increase lasix to 40 mg IV BID - monitor weight, output and renal function closely. She is improving- have been able to wean down oxygen to 1L  - hoping to get her off of it.  Time Spent Directly with Patient:  I have spent a total of 25 minutes with the patient reviewing hospital notes, telemetry, EKGs, labs and examining the patient as well as establishing an assessment and plan that was discussed personally with the patient.  > 50% of time was spent in direct patient care.  Length of Stay:  LOS: 2 days   Pixie Casino, MD, [facility], [place] Director of the [facility] Lipid Disorders &  Cardiovascular Risk [facility] Diplomate of the American Board of Clinical Lipidology Attending Cardiologist  Direct Dial: [phone]   Fax: [phone]  Website:  [email or web address] 10/28/2021, 9:18 AM

## 2021-10-28 NOTE — Progress Notes (Signed)
HPI: Laurie Pugh is a 88 y.o. female with medical history significant of chronic a-flutter, HTN, chronic back pain on narcotics, presented with cough and increasing shortness of breath.  Symptoms started 2 days ago, patient developed occasional dry cough and new onset of shortness of breath.  Denies any chest pains.  She also complained about episodic palpitations.  On further asking, she reported that she has had frequent palpitations " for a while", some episodes can be as frequent as 2-3 times a week and lasted as long as 1 day.  She denies any fever or chills, and cough has been dry.  She usually only sleep 3 hours a day, and she denied orthopnea.  She denied urinary symptoms no diarrhea.  She reported that her PCP has been doing her A. fib medications and she has not seen a cardiologist for several years.  At baseline, patient lives by herself, she eats regular food and no history of choking or cough after eating.  ED Course: Patient was found in and out uncontrolled a flutter.  Blood pressure remained stable, O2 saturation 89% on room air on arrival, stabilized on 2 L.  Chest x-ray suspicious for right lower field.  Subjective Feeling less short of breath.  Physical Exam: Vitals:   10/28/21 1031 10/28/21 1032 10/28/21 1104 10/28/21 1106  BP:   109/73 109/73  Pulse: 79 81 81 82  Resp:      Temp:      TempSrc:      SpO2: 96% 96%  94%  Weight:        Constitutional: NAD, calm, comfortable Vitals:   10/28/21 1031 10/28/21 1032 10/28/21 1104 10/28/21 1106  BP:   109/73 109/73  Pulse: 79 81 81 82  Resp:      Temp:      TempSrc:      SpO2: 96% 96%  94%  Weight:       Eyes: PERRL, lids and conjunctivae normal ENMT: Mucous membranes are moist. Posterior pharynx clear of any exudate or lesions.Normal dentition.  Neck: normal, supple, no masses, no thyromegaly Respiratory: clear to auscultation bilaterally, no wheezing, No accessory muscle use.  Cardiovascular: Irregular  rate, no murmurs / rubs / gallops. No extremity edema. 2+ pedal pulses. No carotid bruits.  Abdomen: no tenderness, no masses palpated. No hepatosplenomegaly. Bowel sounds positive.  Musculoskeletal: no clubbing / cyanosis. No joint deformity upper and lower extremities. Good ROM, no contractures. Normal muscle tone.  Skin: no rashes, lesions, ulcers. No induration Neurologic: CN 2-12 grossly intact. Sensation intact, DTR normal. Strength 5/5 in all 4.  Psychiatric: Normal judgment and insight. Alert and oriented x 3. Normal mood.     Labs on Admission: I have personally reviewed following labs and imaging studies  CBC: Recent Labs  Lab 10/26/21 1402 10/27/21 0400  WBC 11.5* 8.5  HGB 12.7 13.4  HCT 39.5 41.0  MCV 89.8 90.1  PLT 158 [id number]   Basic Metabolic Panel: Recent Labs  Lab 10/26/21 1402 10/27/21 0400  NA 138 141  K 3.2* 5.0  CL 100 106  CO2 26 27  GLUCOSE 127* 106*  BUN 18 16  CREATININE 0.67 0.68  CALCIUM 9.1 9.4   GFR: Estimated Creatinine Clearance: 33.2 mL/min (by C-G formula based on SCr of 0.68 mg/dL). Liver Function Tests: No results for input(s): AST, ALT, ALKPHOS, BILITOT, PROT, ALBUMIN in the last 168 hours. No results for input(s): LIPASE, AMYLASE in the last 168 hours. No results for input(s): AMMONIA  in the last 168 hours. Coagulation Profile: Recent Labs  Lab 10/26/21 1402  INR 1.3*   Cardiac Enzymes: No results for input(s): CKTOTAL, CKMB, CKMBINDEX, TROPONINI in the last 168 hours. BNP (last 3 results) No results for input(s): PROBNP in the last 8760 hours. HbA1C: No results for input(s): HGBA1C in the last 72 hours. CBG: No results for input(s): GLUCAP in the last 168 hours. Lipid Profile: Recent Labs    10/27/21 0400  CHOL 103  HDL 41  LDLCALC 48  TRIG 69  CHOLHDL 2.5   Thyroid Function Tests: Recent Labs    10/26/21 1550  TSH 1.056   Anemia Panel: No results for input(s): VITAMINB12, FOLATE, FERRITIN, TIBC, IRON,  RETICCTPCT in the last 72 hours. Urine analysis:    Component Value Date/Time   COLORURINE YELLOW 06/24/2021 1130   APPEARANCEUR CLOUDY (A) 06/24/2021 1130   LABSPEC 1.013 06/24/2021 1130   PHURINE 6.0 06/24/2021 1130   GLUCOSEU NEGATIVE 06/24/2021 1130   HGBUR LARGE (A) 06/24/2021 1130   BILIRUBINUR NEGATIVE 06/24/2021 1130   KETONESUR NEGATIVE 06/24/2021 1130   PROTEINUR >300 (A) 06/24/2021 1130   NITRITE POSITIVE (A) 06/24/2021 1130   LEUKOCYTESUR LARGE (A) 06/24/2021 1130    Radiological Exams on Admission: DG Chest 2 View  Result Date: 10/26/2021 CLINICAL DATA:  Shortness of breath EXAM: CHEST - 2 VIEW COMPARISON:  Chest radiograph 06/24/2021 FINDINGS: The heart is mildly enlarged, unchanged. The upper mediastinal contours are stable. There are coarsened interstitial markings bilaterally, similar to the prior study and likely chronic. There is patchy opacity in the right lower lobe not present on the prior study. There is no significant pleural effusion. There is no pneumothorax. There is a moderate size hiatal hernia, unchanged. There is no acute osseous abnormality. There is unchanged compression deformity of an upper lumbar vertebral body. IMPRESSION: 1. Patchy opacity in the right lower lobe not present on the prior study could reflect infection or aspiration. 2. Moderate-sized hiatal hernia. Electronically Signed   By: Valetta Mole M.D.   On: 10/26/2021 14:53   ECHOCARDIOGRAM COMPLETE  Result Date: 10/27/2021    ECHOCARDIOGRAM REPORT   Patient Name:   Laurie Pugh Date of Exam: 10/27/2021 Medical Rec #:  [id number]         Height:       63.0 in Accession #:    [id number]        Weight:       101.0 lb Date of Birth:  November 02, 1931        BSA:          1.446 m Patient Age:    40 years          BP:           113/63 mmHg Patient Gender: F                 HR:           69 bpm. Exam Location:  Inpatient Procedure: 2D Echo Indications:    Acute diastolic CHF  History:        Patient has  prior history of Echocardiogram examinations, most                 recent 03/22/2021. Angina; Risk Factors:Dyslipidemia and                 Hypertension. SVT                 PAF.  Sonographer:  Arlyss Gandy Referring Phys: [id number] [place]  1. Left ventricular ejection fraction, by estimation, is 60 to 65%. The left ventricle has normal function. The left ventricle has no regional wall motion abnormalities. There is mild concentric left ventricular hypertrophy. Left ventricular diastolic parameters are consistent with Grade III diastolic dysfunction (restrictive). Elevated left atrial pressure.  2. Right ventricular systolic function is mildly reduced. The right ventricular size is normal. There is moderately elevated pulmonary artery systolic pressure.  3. Left atrial size was mildly dilated.  4. Right atrial size was moderately dilated.  5. The mitral valve is normal in structure. Trivial mitral valve regurgitation. No evidence of mitral stenosis.  6. Tricuspid valve regurgitation is moderate.  7. The aortic valve is tricuspid. Aortic valve regurgitation is mild to moderate. Aortic valve sclerosis is present, with no evidence of aortic valve stenosis. Aortic regurgitation PHT measures 432 msec.  8. The inferior vena cava is normal in size with <50% respiratory variability, suggesting right atrial pressure of 8 mmHg. FINDINGS  Left Ventricle: Left ventricular ejection fraction, by estimation, is 60 to 65%. The left ventricle has normal function. The left ventricle has no regional wall motion abnormalities. The left ventricular internal cavity size was normal in size. There is  mild concentric left ventricular hypertrophy. Left ventricular diastolic parameters are consistent with Grade III diastolic dysfunction (restrictive). Elevated left atrial pressure. Right Ventricle: The right ventricular size is normal. No increase in right ventricular wall thickness. Right ventricular systolic function is  mildly reduced. There is moderately elevated pulmonary artery systolic pressure. The tricuspid regurgitant velocity is 3.44 m/s, and with an assumed right atrial pressure of 8 mmHg, the estimated right ventricular systolic pressure is [id number] mmHg. Left Atrium: Left atrial size was mildly dilated. Right Atrium: Right atrial size was moderately dilated. Pericardium: There is no evidence of pericardial effusion. Mitral Valve: The mitral valve is normal in structure. Mild mitral annular calcification. Trivial mitral valve regurgitation. No evidence of mitral valve stenosis. MV peak gradient, 10.7 mmHg. The mean mitral valve gradient is 4.0 mmHg. Tricuspid Valve: The tricuspid valve is normal in structure. Tricuspid valve regurgitation is moderate . No evidence of tricuspid stenosis. There is mild prolapse of the tricuspid anterior and septal leaflets. Aortic Valve: The aortic valve is tricuspid. Aortic valve regurgitation is mild to moderate. Aortic regurgitation PHT measures 432 msec. Aortic valve sclerosis is present, with no evidence of aortic valve stenosis. Aortic valve mean gradient measures 2.0  mmHg. Aortic valve peak gradient measures 4.5 mmHg. Aortic valve area, by VTI measures 2.40 cm. Pulmonic Valve: The pulmonic valve was not well visualized. Pulmonic valve regurgitation is not visualized. No evidence of pulmonic stenosis. Aorta: The aortic root is normal in size and structure. Venous: The inferior vena cava is normal in size with less than 50% respiratory variability, suggesting right atrial pressure of 8 mmHg. IAS/Shunts: No atrial level shunt detected by color flow Doppler.  LEFT VENTRICLE PLAX 2D LVIDd:         2.70 cm   Diastology LVIDs:         2.00 cm   LV e' medial:    6.31 cm/s LV PW:         1.00 cm   LV E/e' medial:  18.9 LV IVS:        1.10 cm   LV e' lateral:   6.96 cm/s LVOT diam:     1.90 cm   LV E/e' lateral: 17.1 LV SV:  45 LV SV Index:   31 LVOT Area:     2.84 cm  RIGHT VENTRICLE             IVC RV Basal diam:  4.00 cm    IVC diam: 1.90 cm RV Mid diam:    3.40 cm RV S prime:     7.94 cm/s TAPSE (M-mode): 1.2 cm LEFT ATRIUM             Index        RIGHT ATRIUM           Index LA diam:        3.80 cm 2.63 cm/m   RA Area:     22.10 cm LA Vol (A2C):   62.0 ml 42.87 ml/m  RA Volume:   64.90 ml  44.87 ml/m LA Vol (A4C):   49.1 ml 33.95 ml/m LA Biplane Vol: 56.8 ml 39.27 ml/m  AORTIC VALVE AV Area (Vmax):    2.46 cm AV Area (Vmean):   2.30 cm AV Area (VTI):     2.40 cm AV Vmax:           106.00 cm/s AV Vmean:          72.700 cm/s AV VTI:            0.188 m AV Peak Grad:      4.5 mmHg AV Mean Grad:      2.0 mmHg LVOT Vmax:         91.90 cm/s LVOT Vmean:        59.100 cm/s LVOT VTI:          0.159 m LVOT/AV VTI ratio: 0.85 AI PHT:            432 msec  AORTA Ao Root diam: 3.40 cm Ao Asc diam:  3.30 cm MITRAL VALVE                TRICUSPID VALVE MV Area (PHT): 3.16 cm     TR Peak grad:   47.3 mmHg MV Area VTI:   1.57 cm     TR Vmax:        344.00 cm/s MV Peak grad:  10.7 mmHg MV Mean grad:  4.0 mmHg     SHUNTS MV Vmax:       1.64 m/s     Systemic VTI:  0.16 m MV Vmean:      87.4 cm/s    Systemic Diam: 1.90 cm MV Decel Time: 240 msec MV E velocity: 119.00 cm/s Kardie Tobb DO Electronically signed by Berniece Salines DO Signature Date/Time: 10/27/2021/6:19:21 PM    Final     EKG: Independently reviewed.  A flutter with QTC prolongation  Assessment/Plan Principal Problem:   CHF (congestive heart failure) (HCC) Active Problems:   Atrial fibrillation (HCC)   Acute diastolic CHF decompensation -Likely secondary to uncontrolled a flutter -Continue current rate control medication from atenolol 25 mg daily to metoprolol 50 mg twice daily.  Continue Eliquis. -Repeat echocardiogram, patient has a abnormal CT abdomen/chest in September 2022 which showed cardiomegaly and biventricular dilation, liver congestion all implying a CHF. D/W cardiology NP, cardiology will see the patient -Lasix 20 mg IV  twice daily  Right-sided pneumonia -Will treated as CAP, but unlikely this CAP causing all her breathing symptoms -Will also consult speech. -Continue Doxy BID.  A flutter, chronic, poorly controlled -Rate control as above, continue Eliquis.  HTN -Continue ARB, hold chlorthalidone while on Lasix.  Metoprolol replaced atenolol.  Question of COPD -Hx of ex-smoker -There was a abnormal CT chest last year showing COPD like changes -Not much wheezing on exam, continue Atrovent nebulizer and as needed, add Pulmicort. -Outpatient pulm function test.  Hypokalemia -P.o. replacement and recheck level tomorrow.  Send magnesium level.  Prolonged QTC -Use Doxycycline for PNA.  Likely discharge in the next 24 to 48 hours once weans off supplemental oxygen.  Currently desatting 86% on 2 L with ambulation  Alexanderia Gorby A MD [facility]  10/28/2021, 11:13 AM

## 2021-10-28 NOTE — Evaluation (Addendum)
Physical Therapy Evaluation/ Discharge Patient Details Name: Laurie Pugh MRN: [id number] DOB: Feb 22, 1932 Today's Date: 10/28/2021  History of Present Illness  88 yo admitted 1/31 with SOB with acute CHF, Aflutter and PNA. PMhx: Aflutter, HTN, chronic back pain s/Pugh vertebroplasty, CAD, HLD, SDH  Clinical Impression  PT very pleasant and moving well. On arrival pt on 3L with sats 98% with decrease to 2L sats maintained. ON RA able to sat 95% at rest but with limited gait dropped to 87%. With 1L during gait able to maintain sats >90%. Pt educated for need to monitor sats with mobility with recommendation for home pulse ox and continued ambulation with nursing and mobility. Pt at baseline functional level with family assisting for transportation and meals and pt fixing quick prep items and caring for herself. PT with all needed DME at home and verbalizes understanding of education. No further acute therapy needs at this time.        Recommendations for follow up therapy are one component of a multi-disciplinary discharge planning process, led by the attending physician.  Recommendations may be updated based on patient status, additional functional criteria and insurance authorization.  Follow Up Recommendations No PT follow up    Assistance Recommended at Discharge PRN  Patient can return home with the following  Assist for transportation;Assistance with cooking/housework    Equipment Recommendations None recommended by PT  Recommendations for Other Services       Functional Status Assessment Patient has not had a recent decline in their functional status     Precautions / Restrictions Precautions Precautions: Other (comment) Precaution Comments: watch sats      Mobility  Bed Mobility Overal bed mobility: Modified Independent                  Transfers Overall transfer level: Modified independent                      Ambulation/Gait Ambulation/Gait  assistance: Supervision Gait Distance (Feet): 250 Feet Assistive device: None Gait Pattern/deviations: Step-through pattern, Decreased stride length, Trunk flexed   Gait velocity interpretation: >2.62 ft/sec, indicative of community ambulatory   General Gait Details: pt maintains neck flexion with gait and slow but steady gait, no need for physical assist with long hall ambulation. Sats >90% on 1L. cues for looking up but pt states she has trouble with this due to pain  Stairs Stairs: Yes Stairs assistance: Modified independent (Device/Increase time) Stair Management: Alternating pattern, Forwards, One rail Right Number of Stairs: 5 General stair comments: pt with good stability with use of rail  Wheelchair Mobility    Modified Rankin (Stroke Patients Only)       Balance Overall balance assessment: Mild deficits observed, not formally tested                                           Pertinent Vitals/Pain Pain Assessment Pain Assessment: 0-10 Pain Score: 3  Pain Location: back Pain Descriptors / Indicators: Aching Pain Intervention(s): Limited activity within patient's tolerance, Monitored during session, Repositioned    Home Living Family/patient expects to be discharged to:: Private residence Living Arrangements: Alone Available Help at Discharge: Family;Available PRN/intermittently Type of Home: Mobile home Home Access: Stairs to enter Entrance Stairs-Rails: Right;Left;Can reach both Entrance Stairs-Number of Steps: 4   Home Layout: One level Home Equipment: Agricultural consultant (2 wheels);Shower seat;Grab  bars - tub/shower Additional Comments: doesn't drive but cares for herself otherwise, pt reports no falls in the last year    Prior Function Prior Level of Function : Independent/Modified Independent                     Hand Dominance        Extremity/Trunk Assessment   Upper Extremity Assessment Upper Extremity Assessment:  Generalized weakness    Lower Extremity Assessment Lower Extremity Assessment: Generalized weakness    Cervical / Trunk Assessment Cervical / Trunk Assessment: Kyphotic  Communication   Communication: No difficulties  Cognition Arousal/Alertness: Awake/alert Behavior During Therapy: WFL for tasks assessed/performed Overall Cognitive Status: Within Functional Limits for tasks assessed                                 General Comments: one day off with orientation        General Comments      Exercises     Assessment/Plan    PT Assessment Patient does not need any further PT services  PT Problem List         PT Treatment Interventions      PT Goals (Current goals can be found in the Care Plan section)  Acute Rehab PT Goals PT Goal Formulation: All assessment and education complete, DC therapy    Frequency       Co-evaluation               AM-PAC PT "6 Clicks" Mobility  Outcome Measure Help needed turning from your back to your side while in a flat bed without using bedrails?: None Help needed moving from lying on your back to sitting on the side of a flat bed without using bedrails?: None Help needed moving to and from a bed to a chair (including a wheelchair)?: None Help needed standing up from a chair using your arms (e.g., wheelchair or bedside chair)?: None Help needed to walk in hospital room?: A Little Help needed climbing 3-5 steps with a railing? : A Little 6 Click Score: 22    End of Session Equipment Utilized During Treatment: Oxygen Activity Tolerance: Patient tolerated treatment well Patient left: in chair;with call bell/phone within reach;with chair alarm set Nurse Communication: Mobility status PT Visit Diagnosis: Other abnormalities of gait and mobility (R26.89)    Time: 8527-7824 PT Time Calculation (min) (ACUTE ONLY): 26 min   Charges:   PT Evaluation $PT Eval Moderate Complexity: 1 Mod PT Treatments $Gait  Training: 8-22 mins        Laurie Pugh, PT Acute Rehabilitation Services Pager: [phone] Office: [phone]   Laurie Pugh Laurie Pugh 10/28/2021, 8:38 AM

## 2021-10-28 NOTE — Progress Notes (Signed)
Initial Nutrition Assessment  DOCUMENTATION CODES:  Underweight, Severe malnutrition in context of chronic illness  INTERVENTION:  Liberalize diet to regular, encourage PO intake Ensure Enlive po TID, each supplement provides 350 kcal and 20 grams of protein. Educated on adding kcal and protein to meals at home MVI with minerals daily  NUTRITION DIAGNOSIS:  Severe Malnutrition related to chronic illness (CHF) as evidenced by severe fat depletion, severe muscle depletion.  GOAL:  Patient will meet greater than or equal to 90% of their needs   MONITOR:  PO intake, Supplement acceptance, Weight trends  REASON FOR ASSESSMENT:  Consult Assessment of nutrition requirement/status  ASSESSMENT:  88 year old female with a history of atrial fibrillation, HLD, HTN, and GERD presented to ED with concern for worsening SOB.  Cardiology workup suggestive of new CHF dx. Pt has met with CHF navigator for education.  Pt resting in bed at the time of assessment, reports she is feeling a little drowsy from her medicines. Discussed normal intake and recent nutrition hx.   Pt reports that at home she eats 3x/d, breakfast: oatmeal and toast, lunch: PB&J sandwich, dinner: varies, but is typically something easy to prepare such as a corndog or soup. Pt reports it is the lightest meal of her day. Normally drinks water.   Weight loss of 7.4% noted in the last ~4 months (9/29-2/2) which is not severe, but concerning due to pt's advanced age, low BMI, and comorbidities. Pt reports that she has noticed the weight loss, but has not changed her eating habits. Discussed adding kcal and protein into her foods that she is eating at home to prevent further loss.   NUTRITION - FOCUSED PHYSICAL EXAM: Flowsheet Row Most Recent Value  Orbital Region Severe depletion  Upper Arm Region Severe depletion  Thoracic and Lumbar Region Severe depletion  Buccal Region Severe depletion  Temple Region Severe depletion   Clavicle Bone Region Severe depletion  Clavicle and Acromion Bone Region Severe depletion  Scapular Bone Region Severe depletion  Dorsal Hand Severe depletion  Patellar Region Severe depletion  Anterior Thigh Region Severe depletion  Posterior Calf Region Severe depletion  Edema (RD Assessment) None  Hair Reviewed  Eyes Reviewed  Mouth Reviewed  Skin Reviewed  Nails Reviewed   Diet Order:   Diet Order             Diet regular Room service appropriate? Yes; Fluid consistency: Thin  Diet effective now                   EDUCATION NEEDS:  Education needs have been addressed  Skin:  Skin Assessment: Reviewed RN Assessment  Last BM:  1/30  Height:  Ht Readings from Last 1 Encounters:  06/24/21 5' 3"  (1.6 m)   Weight:  Wt Readings from Last 1 Encounters:  10/28/21 42.4 kg    Ideal Body Weight:  52.3 kg  BMI:  Body mass index is 16.55 kg/m.  Estimated Nutritional Needs:  Kcal:  1300-1500 kcal/d Protein:  65-80 g/d Fluid:  1.5-1.8 L/d   Ranell Patrick, RD, LDN Clinical Dietitian RD pager # available in [facility]  After hours/weekend pager # available in [facility]

## 2021-10-28 NOTE — Plan of Care (Signed)
  Problem: Cardiac: Goal: Ability to achieve and maintain adequate cardiopulmonary perfusion will improve Outcome: Progressing   

## 2021-10-28 NOTE — Progress Notes (Signed)
Heart Failure Nurse Navigator Progress Note  Echo- EF 60-65%, G3DD  Dr Rennis Golden w/[facility] following, needs more diuresis, IV Lasix increased today  H&V TOC CSW consulted to complete SDOH  Will plan to schedule f/u in H&V [facility] clinic  Navigation team will continue to follow this hospitalization.   Meredith Staggers, RN, BSN, [facility] HF Navigator//Specialty Coordinator Advanced Heart Failure Clinic

## 2021-10-28 NOTE — Progress Notes (Signed)
Mobility Specialist Progress Note    10/28/21 1502  Mobility  Activity Ambulated independently in hallway  Level of Assistance Standby assist, set-up cues, supervision of patient - no hands on  Assistive Device None  Distance Ambulated (ft) 350 ft  Activity Response Tolerated fair  $Mobility charge 1 Mobility   Pre-Mobility: 90 HR, 95% SpO2 Post-Mobility: 97 HR, 125/88 BP, 99% SpO2  Pt received in bed and agreeable. Ambulated on 1LO2. Took x1 seated rest break for ~2 minutes. Returned to bed with RN and NT present.   [facility] Mobility Specialist  M.S. 2C and 6E: [phone] M.S. 4E: [phone]

## 2021-10-28 NOTE — TOC Initial Note (Signed)
Transition of Care [facility]) - Initial/Assessment Note    Patient Details  Name: Laurie Pugh MRN: [id number] Date of Birth: [birth year]  Transition of Care [facility]) CM/SW Contact:    Verdell Carmine, RN Phone Number: 10/28/2021, 9:39 AM  Clinical Narrative:                 88 YO patient admitted with uncontrolled Atrial Flutter. Patient Lives on own with family support for meals and housekeeping. PT evaluation revealed that she has all DME needed, and no HH is needed at this time. Oxygen qualifiers revealed needs at 1L CM will follow for potential home oxygen.    Expected Discharge Plan: Home/Self Care Barriers to Discharge: Continued Medical Work up   Patient Goals and CMS Choice        Expected Discharge Plan and Services Expected Discharge Plan: Home/Self Care   Discharge Planning Services: CM Consult   Living arrangements for the past 2 months: Apartment                                      Prior Living Arrangements/Services Living arrangements for the past 2 months: Apartment Lives with:: Self Patient language and need for interpreter reviewed:: Yes        Need for Family Participation in Patient Care: Yes (Comment)     Criminal Activity/Legal Involvement Pertinent to Current Situation/Hospitalization: No - Comment as needed  Activities of Daily Living      Permission Sought/Granted                  Emotional Assessment       Orientation: : Oriented to Self, Oriented to Place, Oriented to  Time Alcohol / Substance Use: Not Applicable Psych Involvement: No (comment)  Admission diagnosis:  Shortness of breath [R06.02] CHF (congestive heart failure) ([place]) [I50.9] Community acquired pneumonia of right lower lobe of lung [J18.9] Patient Active Problem List   Diagnosis Date Noted   CHF (congestive heart failure) ([place]) 10/26/2021   Pressure injury of skin 03/26/2021   Pneumonia 03/20/2021   Stenosis of carotid artery 02/12/2018   Subdural  hematoma 05/21/2017   Chronic anticoagulation 05/21/2017   Esophageal stricture 01/30/2017   Esophageal dysphagia    Esophageal obstruction due to food impaction    Sinus bradycardia 05/26/2014   PVC's (premature ventricular contractions) 05/26/2014   Paroxysmal SVT (supraventricular tachycardia) ([place]) 07/16/2013   Atrial fibrillation ([place]) 06/24/2013   Mixed hyperlipidemia 06/24/2013   Essential hypertension 06/24/2013   Angina pectoris, unstable ([place]) 06/24/2013   Neck pain 06/24/2013   DOE (dyspnea on exertion) 06/24/2013   PCP:  Crist Infante, MD Pharmacy:   [facility] ([place]), [place] [id number] [place] ([place] [address] Phone: [phone] Fax: [phone]  [facility] ([place]) - [place], [facility] [address] [place] [address] [place] [address] Phone: [phone] Fax: [phone]  [facility] ([facility] ) - [place], [place] - [address] [place] [address] Phone: [phone] Fax: [phone]  [id number] - [place], [place] [address] [place] [address] Phone: [phone] Fax: [phone]     Social Determinants of Health (SDOH) Interventions    Readmission Risk Interventions No flowsheet data found.

## 2021-10-28 NOTE — Progress Notes (Signed)
°   10/28/21 0900  Assess: MEWS Score  Temp 98 F (36.7 C)  BP (!) 90/58  Pulse Rate (!) 106  ECG Heart Rate 97  Level of Consciousness Alert  SpO2 95 %  O2 Device Nasal Cannula  O2 Flow Rate (L/min) 2 L/min  Assess: MEWS Score  MEWS Temp 0  MEWS Systolic 1  MEWS Pulse 0  MEWS RR 1  MEWS LOC 0  MEWS Score 2  MEWS Score Color Yellow  Assess: if the MEWS score is Yellow or Red  Were vital signs taken at a resting state? Yes  Focused Assessment No change from prior assessment  Early Detection of Sepsis Score *See Row Information* Low  MEWS guidelines implemented *See Row Information* Yes  Treat  Pain Scale 0-10  Pain Score 6  Pain Type Acute pain  Pain Location Back  Pain Orientation Lower  Pain Descriptors / Indicators Aching;Discomfort  Pain Intervention(s) Medication (See eMAR)  Multiple Pain Sites No  Take Vital Signs  Increase Vital Sign Frequency  Yellow: Q 2hr X 2 then Q 4hr X 2, if remains yellow, continue Q 4hrs  Escalate  MEWS: Escalate Yellow: discuss with charge nurse/RN and consider discussing with provider and RRT  Notify: Charge Nurse/RN  Name of Charge Nurse/RN Notified Jessica RN  Date Charge Nurse/RN Notified 10/28/21  Time Charge Nurse/RN Notified 0930  Document  Patient Outcome Stabilized after interventions  Progress note created (see row info) Yes   BP soft in 90s/60s while resting in bed.  Requested MD for parameters for metoprolol and benazepril.

## 2021-10-29 ENCOUNTER — Inpatient Hospital Stay (HOSPITAL_COMMUNITY): Payer: Medicare Other

## 2021-10-29 LAB — BASIC METABOLIC PANEL
Anion gap: 11 (ref 5–15)
BUN: 43 mg/dL — ABNORMAL HIGH (ref 8–23)
CO2: 26 mmol/L (ref 22–32)
Calcium: 9.2 mg/dL (ref 8.9–10.3)
Chloride: 100 mmol/L (ref 98–111)
Creatinine, Ser: 0.85 mg/dL (ref 0.44–1.00)
GFR, Estimated: >60 mL/min (ref >60)
Glucose, Bld: 122 mg/dL — ABNORMAL HIGH (ref 70–99)
Potassium: 4.3 mmol/L (ref 3.5–5.1)
Sodium: 137 mmol/L (ref 135–145)

## 2021-10-29 MED ORDER — FUROSEMIDE 40 MG PO TABS
40.0000 mg | ORAL_TABLET | Freq: Every day | ORAL | Status: DC
  Administered 2021-10-29: 40 mg via ORAL
  Filled 2021-10-29: qty 1

## 2021-10-29 MED ORDER — ACETAMINOPHEN 325 MG PO TABS
650.0000 mg | ORAL_TABLET | Freq: Four times a day (QID) | ORAL | Status: DC | PRN
  Administered 2021-10-29: 650 mg via ORAL
  Filled 2021-10-29: qty 2

## 2021-10-29 MED ORDER — METOPROLOL TARTRATE 50 MG PO TABS: 50.0000 mg | ORAL_TABLET | Freq: Two times a day (BID) | ORAL | 0 refills | Status: AC

## 2021-10-29 MED ORDER — FUROSEMIDE 40 MG PO TABS: 40.0000 mg | ORAL_TABLET | Freq: Every day | ORAL | 0 refills | Status: DC

## 2021-10-29 MED ORDER — DOXYCYCLINE HYCLATE 100 MG PO TABS: 100.0000 mg | ORAL_TABLET | Freq: Two times a day (BID) | ORAL | 0 refills | Status: AC

## 2021-10-29 NOTE — Progress Notes (Signed)
Mobility Specialist Progress Note    10/29/21 1147  Mobility  Activity Ambulated with assistance in hallway  Level of Assistance Contact guard assist, steadying assist  Assistive Device  (HHA)  Distance Ambulated (ft) 220 ft  Activity Response Tolerated fair  $Mobility charge 1 Mobility   Pre-Mobility: 98 HR, 93/57 BP, 92% SpO2 During Mobility: tachy >120 HR Post-Mobility: 107 HR, 124/74 BP, 92% SpO2  Pt received in bed c/o back pain but agreeable. Had void in BR. Pt c/o a little SOB during on RA and SpO2 read in low-mid 80s but pleth unreliable. Encouraged pursed lip breathing. Returned to bed with call bell in reach and RN notified.   [facility] Mobility Specialist  M.S. 2C and 6E: [phone] M.S. 4E: [phone]

## 2021-10-29 NOTE — Progress Notes (Signed)
Heart Failure Nurse Navigator Progress Note   Patient scheduled HV TOC appt on Monday Feb 13th at 11:00 AM.  Patient given appt card and direction to clinic.

## 2021-10-29 NOTE — TOC Progression Note (Signed)
Transition of Care [facility]) - Progression Note    Patient Details  Name: Laurie Pugh MRN: [id number] Date of Birth: 04/13/1932  Transition of Care [facility]) CM/SW Contact  Lockie Pares, RN Phone Number: 10/29/2021, 12:24 PM  Clinical Narrative:     Patient off oxygen, walking saturations beginning 92% ending 92%.  CM will sign off, please place consult if needed.  Expected Discharge Plan: Home/Self Care Barriers to Discharge: Continued Medical Work up  Expected Discharge Plan and Services Expected Discharge Plan: Home/Self Care   Discharge Planning Services: CM Consult   Living arrangements for the past 2 months: Apartment                                       Social Determinants of Health (SDOH) Interventions    Readmission Risk Interventions No flowsheet data found.

## 2021-10-29 NOTE — Discharge Summary (Signed)
Physician Discharge Summary  Laurie Pugh [id number] DOB: 1932-04-25 DOA: 10/26/2021  PCP: Crist Infante, MD  Admit date: 10/26/2021 Discharge date: 10/29/2021   Discharge Diagnoses:  Principal Problem:   CHF (congestive heart failure) [facility]) Active Problems:   Atrial fibrillation [facility])   Discharge Condition: Stable   Filed Weights   10/28/21 0606 10/29/21 0343  Weight: 42.4 kg 41.7 kg    History of present illness:  Laurie Pugh is a 88 y.o. female with medical history significant of chronic a-flutter, HTN, chronic back pain on narcotics, presented with cough and increasing shortness of breath.   Symptoms started 2 days ago, patient developed occasional dry cough and new onset of shortness of breath.  Denies any chest pains.  She also complained about episodic palpitations.  On further asking, she reported that she has had frequent palpitations " for a while", some episodes can be as frequent as 2-3 times a week and lasted as long as 1 day.  She denies any fever or chills, and cough has been dry.  She usually only sleep 3 hours a day, and she denied orthopnea.  She denied urinary symptoms no diarrhea.  She reported that her PCP has been doing her A. fib medications and she has not seen a cardiologist for several years.  At baseline, patient lives by herself, she eats regular food and no history of choking or cough after eating.   ED Course: Patient was found in and out uncontrolled a flutter.  Blood pressure remained stable, O2 saturation 89% on room air on arrival, stabilized on 2 L.   Chest x-ray suspicious for right lower field.  Patient was admitted diuresed and empirically cover with doxycycline.  Patient clinically improved over the next couple of days weaned down to room air with O2 sats above 90%.  Patient was ambulating hallway doing well.  We have set up for her home health and physical therapy.  Family was wishing for nursing home placement however she did not  qualify for this based on her physical therapy notes and evaluation.  Suggested they can follow-up with primary care physician in the future for this if needed.  Patient being discharged in stable and improved condition to follow-up with primary care physician in approximately 1 week.  Also to follow-up with Dr. Debara Pickett in 2 to 4 weeks.  Hospital Course:  Acute diastolic CHF decompensation -Likely secondary to uncontrolled a flutter -Continue current rate control medication from atenolol 25 mg daily to metoprolol 50 mg twice daily.  Continue Eliquis. -Repeat echocardiogram, patient has a abnormal CT abdomen/chest in September 2022 which showed cardiomegaly and biventricular dilation, liver congestion all implying a CHF.  -Lasix changed to 40 mg daily   Right-sided pneumonia --Continue Doxy BID for 7 days total   A flutter, chronic, poorly controlled -Rate control as above, continue Eliquis.    Question of COPD -Hx of ex-smoker -There was a abnormal CT chest last year showing COPD like changes -Not much wheezing on exam, continue Atrovent nebulizer and as needed, add Pulmicort. -Outpatient pulm function test.   Hypokalemia -Repleted  Prolonged QTC -Use Doxycycline for PNA.    Discharge Exam: Vitals:   10/29/21 1233 10/29/21 1415  BP: 117/80 111/78  Pulse: 96 90  Resp:    Temp:    SpO2: 93% 92%    General: Alert and orient x4 no apparent distress Cardiovascular: Regular rate and rhythm without murmurs rubs or gallops Respiratory: Clear to auscultation bilaterally no wheezes rhonchi or  rales  Discharge Instructions   Discharge Instructions     Diet - low sodium heart healthy   Complete by: As directed    Discharge instructions   Complete by: As directed    Follow-up primary care physician x1 week  Follow-up with Dr. Debara Pickett in 2 to 4 weeks   Increase activity slowly   Complete by: As directed    No wound care   Complete by: As directed       Allergies as of  10/29/2021       Reactions   Codeine Nausea And Vomiting   Erythromycin Other (See Comments)   Mycins cause severe nausea/vomiting and diarrhea (thinks it was erythromycin)        Medication List     STOP taking these medications    atenolol 25 MG tablet Commonly known as: TENORMIN   benazepril 20 MG tablet Commonly known as: LOTENSIN   chlorthalidone 25 MG tablet Commonly known as: HYGROTON       TAKE these medications    acetaminophen 325 MG tablet Commonly known as: TYLENOL Take 650 mg by mouth every 4 (four) hours as needed for moderate pain, mild pain or headache.   Anoro Ellipta 62.5-25 MCG/ACT Aepb Generic drug: umeclidinium-vilanterol Inhale 1 puff into the lungs daily.   apixaban 5 MG Tabs tablet Commonly known as: ELIQUIS Take 2.5 mg by mouth 2 (two) times daily.   barrier cream Crea Commonly known as: non-specified Apply 1 application topically 2 (two) times daily as needed (buttocks).   doxycycline 100 MG tablet Commonly known as: VIBRA-TABS Take 1 tablet (100 mg total) by mouth 2 (two) times daily for 5 days.   fluticasone 50 MCG/ACT nasal spray Commonly known as: FLONASE Place 1 spray into both nostrils at bedtime.   furosemide 40 MG tablet Commonly known as: LASIX Take 1 tablet (40 mg total) by mouth daily. Start taking on: October 30, 2021   loratadine 10 MG tablet Commonly known as: CLARITIN Take 10 mg by mouth daily.   LORazepam 1 MG tablet Commonly known as: ATIVAN Take 0.5-1 mg by mouth 2 (two) times daily as needed for anxiety.   metoprolol tartrate 50 MG tablet Commonly known as: LOPRESSOR Take 1 tablet (50 mg total) by mouth 2 (two) times daily.   omeprazole 40 MG capsule Commonly known as: PRILOSEC TAKE 1 CAPSULE BY MOUTH  DAILY   oxyCODONE-acetaminophen 5-325 MG tablet Commonly known as: PERCOCET/ROXICET Take 1 tablet by mouth every 6 (six) hours as needed (pain).   [facility] Colon Health Caps Take 1 capsule by mouth  daily.   polyvinyl alcohol 1.4 % ophthalmic solution Commonly known as: LIQUIFILM TEARS Place 1 drop into both eyes as needed for dry eyes.   rosuvastatin 10 MG tablet Commonly known as: CRESTOR Take 10 mg by mouth every evening.       Allergies  Allergen Reactions   Codeine Nausea And Vomiting   Erythromycin Other (See Comments)    Mycins cause severe nausea/vomiting and diarrhea (thinks it was erythromycin)    Follow-up Information     [facility] Follow up.                   The results of significant diagnostics from this hospitalization (including imaging, microbiology, ancillary and laboratory) are listed below for reference.    Significant Diagnostic Studies: DG Chest 2 View  Result Date: 10/26/2021 CLINICAL DATA:  Shortness of breath EXAM: CHEST - 2 VIEW COMPARISON:  Chest  radiograph 06/24/2021 FINDINGS: The heart is mildly enlarged, unchanged. The upper mediastinal contours are stable. There are coarsened interstitial markings bilaterally, similar to the prior study and likely chronic. There is patchy opacity in the right lower lobe not present on the prior study. There is no significant pleural effusion. There is no pneumothorax. There is a moderate size hiatal hernia, unchanged. There is no acute osseous abnormality. There is unchanged compression deformity of an upper lumbar vertebral body. IMPRESSION: 1. Patchy opacity in the right lower lobe not present on the prior study could reflect infection or aspiration. 2. Moderate-sized hiatal hernia. Electronically Signed   By: Valetta Mole M.D.   On: 10/26/2021 14:53   DG CHEST PORT 1 VIEW  Result Date: 10/29/2021 CLINICAL DATA:  CHF EXAM: PORTABLE CHEST 1 VIEW COMPARISON:  Chest x-ray 10/26/2021 FINDINGS: Heart is mildly enlarged. Mediastinum appears stable. Calcified plaques in the aortic arch. Retrocardiac opacity and lucency consistent with hiatal hernia. Pulmonary vasculature is within normal limits.  Similar chronic prominent interstitial lung markings bilaterally with no focal consolidation identified. No pleural effusion or pneumothorax visualized. IMPRESSION: Chronic changes with no acute process identified. Electronically Signed   By: Ofilia Neas M.D.   On: 10/29/2021 10:44   ECHOCARDIOGRAM COMPLETE  Result Date: 10/27/2021    ECHOCARDIOGRAM REPORT   Patient Name:   LASHAYNA CARLYLE Date of Exam: 10/27/2021 Medical Rec #:  [id number]         Height:       63.0 in Accession #:    [id number]        Weight:       101.0 lb Date of Birth:  Jan 16, 1932        BSA:          1.446 m Patient Age:    88 years          BP:           113/63 mmHg Patient Gender: F                 HR:           69 bpm. Exam Location:  Inpatient Procedure: 2D Echo Indications:    Acute diastolic CHF  History:        Patient has prior history of Echocardiogram examinations, most                 recent 03/22/2021. Angina; Risk Factors:Dyslipidemia and                 Hypertension. SVT                 PAF.  Sonographer:    Arlyss Gandy Referring Phys: [id number] [place]  1. Left ventricular ejection fraction, by estimation, is 60 to 65%. The left ventricle has normal function. The left ventricle has no regional wall motion abnormalities. There is mild concentric left ventricular hypertrophy. Left ventricular diastolic parameters are consistent with Grade III diastolic dysfunction (restrictive). Elevated left atrial pressure.  2. Right ventricular systolic function is mildly reduced. The right ventricular size is normal. There is moderately elevated pulmonary artery systolic pressure.  3. Left atrial size was mildly dilated.  4. Right atrial size was moderately dilated.  5. The mitral valve is normal in structure. Trivial mitral valve regurgitation. No evidence of mitral stenosis.  6. Tricuspid valve regurgitation is moderate.  7. The aortic valve is tricuspid. Aortic valve regurgitation is mild to moderate. Aortic valve  sclerosis  is present, with no evidence of aortic valve stenosis. Aortic regurgitation PHT measures 432 msec.  8. The inferior vena cava is normal in size with <50% respiratory variability, suggesting right atrial pressure of 8 mmHg. FINDINGS  Left Ventricle: Left ventricular ejection fraction, by estimation, is 60 to 65%. The left ventricle has normal function. The left ventricle has no regional wall motion abnormalities. The left ventricular internal cavity size was normal in size. There is  mild concentric left ventricular hypertrophy. Left ventricular diastolic parameters are consistent with Grade III diastolic dysfunction (restrictive). Elevated left atrial pressure. Right Ventricle: The right ventricular size is normal. No increase in right ventricular wall thickness. Right ventricular systolic function is mildly reduced. There is moderately elevated pulmonary artery systolic pressure. The tricuspid regurgitant velocity is 3.44 m/s, and with an assumed right atrial pressure of 8 mmHg, the estimated right ventricular systolic pressure is [id number] mmHg. Left Atrium: Left atrial size was mildly dilated. Right Atrium: Right atrial size was moderately dilated. Pericardium: There is no evidence of pericardial effusion. Mitral Valve: The mitral valve is normal in structure. Mild mitral annular calcification. Trivial mitral valve regurgitation. No evidence of mitral valve stenosis. MV peak gradient, 10.7 mmHg. The mean mitral valve gradient is 4.0 mmHg. Tricuspid Valve: The tricuspid valve is normal in structure. Tricuspid valve regurgitation is moderate . No evidence of tricuspid stenosis. There is mild prolapse of the tricuspid anterior and septal leaflets. Aortic Valve: The aortic valve is tricuspid. Aortic valve regurgitation is mild to moderate. Aortic regurgitation PHT measures 432 msec. Aortic valve sclerosis is present, with no evidence of aortic valve stenosis. Aortic valve mean gradient measures 2.0  mmHg. Aortic  valve peak gradient measures 4.5 mmHg. Aortic valve area, by VTI measures 2.40 cm. Pulmonic Valve: The pulmonic valve was not well visualized. Pulmonic valve regurgitation is not visualized. No evidence of pulmonic stenosis. Aorta: The aortic root is normal in size and structure. Venous: The inferior vena cava is normal in size with less than 50% respiratory variability, suggesting right atrial pressure of 8 mmHg. IAS/Shunts: No atrial level shunt detected by color flow Doppler.  LEFT VENTRICLE PLAX 2D LVIDd:         2.70 cm   Diastology LVIDs:         2.00 cm   LV e' medial:    6.31 cm/s LV PW:         1.00 cm   LV E/e' medial:  18.9 LV IVS:        1.10 cm   LV e' lateral:   6.96 cm/s LVOT diam:     1.90 cm   LV E/e' lateral: 17.1 LV SV:         45 LV SV Index:   31 LVOT Area:     2.84 cm  RIGHT VENTRICLE            IVC RV Basal diam:  4.00 cm    IVC diam: 1.90 cm RV Mid diam:    3.40 cm RV S prime:     7.94 cm/s TAPSE (M-mode): 1.2 cm LEFT ATRIUM             Index        RIGHT ATRIUM           Index LA diam:        3.80 cm 2.63 cm/m   RA Area:     22.10 cm LA Vol (A2C):   62.0 ml 42.87 ml/m  RA  Volume:   64.90 ml  44.87 ml/m LA Vol (A4C):   49.1 ml 33.95 ml/m LA Biplane Vol: 56.8 ml 39.27 ml/m  AORTIC VALVE AV Area (Vmax):    2.46 cm AV Area (Vmean):   2.30 cm AV Area (VTI):     2.40 cm AV Vmax:           106.00 cm/s AV Vmean:          72.700 cm/s AV VTI:            0.188 m AV Peak Grad:      4.5 mmHg AV Mean Grad:      2.0 mmHg LVOT Vmax:         91.90 cm/s LVOT Vmean:        59.100 cm/s LVOT VTI:          0.159 m LVOT/AV VTI ratio: 0.85 AI PHT:            432 msec  AORTA Ao Root diam: 3.40 cm Ao Asc diam:  3.30 cm MITRAL VALVE                TRICUSPID VALVE MV Area (PHT): 3.16 cm     TR Peak grad:   47.3 mmHg MV Area VTI:   1.57 cm     TR Vmax:        344.00 cm/s MV Peak grad:  10.7 mmHg MV Mean grad:  4.0 mmHg     SHUNTS MV Vmax:       1.64 m/s     Systemic VTI:  0.16 m MV Vmean:      87.4 cm/s     Systemic Diam: 1.90 cm MV Decel Time: 240 msec MV E velocity: 119.00 cm/s Kardie Tobb DO Electronically signed by Berniece Salines DO Signature Date/Time: 10/27/2021/6:19:21 PM    Final     Microbiology: Recent Results (from the past 240 hour(s))  Resp Panel by RT-PCR (Flu A&B, Covid) Nasopharyngeal Swab     Status: None   Collection Time: 10/26/21  2:00 PM   Specimen: Nasopharyngeal Swab; Nasopharyngeal(NP) swabs in vial transport medium  Result Value Ref Range Status   SARS Coronavirus 2 by RT PCR NEGATIVE NEGATIVE Final    Comment: (NOTE) SARS-CoV-2 target nucleic acids are NOT DETECTED.  The SARS-CoV-2 RNA is generally detectable in upper respiratory specimens during the acute phase of infection. The lowest concentration of SARS-CoV-2 viral copies this assay can detect is 138 copies/mL. A negative result does not preclude SARS-Cov-2 infection and should not be used as the sole basis for treatment or other patient management decisions. A negative result may occur with  improper specimen collection/handling, submission of specimen other than nasopharyngeal swab, presence of viral mutation(s) within the areas targeted by this assay, and inadequate number of viral copies(<138 copies/mL). A negative result must be combined with clinical observations, patient history, and epidemiological information. The expected result is Negative.  Fact Sheet for Patients:  EntrepreneurPulse.com.au  Fact Sheet for Healthcare Providers:  IncredibleEmployment.be  This test is no t yet approved or cleared by the [place] FDA and  has been authorized for detection and/or diagnosis of SARS-CoV-2 by FDA under an Emergency Use Authorization (EUA). This EUA will remain  in effect (meaning this test can be used) for the duration of the COVID-19 declaration under Section 564(b)(1) of the Act, 21 U.S.C.section 360bbb-3(b)(1), unless the authorization is terminated  or  revoked sooner.       Influenza A by  PCR NEGATIVE NEGATIVE Final   Influenza B by PCR NEGATIVE NEGATIVE Final    Comment: (NOTE) The Xpert Xpress SARS-CoV-2/FLU/RSV plus assay is intended as an aid in the diagnosis of influenza from Nasopharyngeal swab specimens and should not be used as a sole basis for treatment. Nasal washings and aspirates are unacceptable for Xpert Xpress SARS-CoV-2/FLU/RSV testing.  Fact Sheet for Patients: EntrepreneurPulse.com.au  Fact Sheet for Healthcare Providers: IncredibleEmployment.be  This test is not yet approved or cleared by the [place] FDA and has been authorized for detection and/or diagnosis of SARS-CoV-2 by FDA under an Emergency Use Authorization (EUA). This EUA will remain in effect (meaning this test can be used) for the duration of the COVID-19 declaration under Section 564(b)(1) of the Act, 21 U.S.C. section 360bbb-3(b)(1), unless the authorization is terminated or revoked.  Performed at [facility], [place] [address], [place], [place] [address]   Blood culture (routine x 2)     Status: None (Preliminary result)   Collection Time: 10/26/21  3:30 PM   Specimen: BLOOD  Result Value Ref Range Status   Specimen Description BLOOD BLOOD RIGHT ARM  Final   Special Requests   Final    BOTTLES DRAWN AEROBIC AND ANAEROBIC Blood Culture adequate volume   Culture   Final    NO GROWTH 3 DAYS Performed at [facility], [place] [address] [place], [place] [address]    Report Status PENDING  Incomplete  Blood culture (routine x 2)     Status: None (Preliminary result)   Collection Time: 10/26/21  3:50 PM   Specimen: BLOOD  Result Value Ref Range Status   Specimen Description BLOOD BLOOD LEFT ARM  Final   Special Requests   Final    BOTTLES DRAWN AEROBIC AND ANAEROBIC Blood Culture results may not be optimal due to an excessive volume of blood received in culture bottles   Culture   Final     NO GROWTH 3 DAYS Performed at [facility], [place] [address] [place], [place] [address]    Report Status PENDING  Incomplete     Labs: Basic Metabolic Panel: Recent Labs  Lab 10/26/21 1402 10/27/21 0400 10/29/21 0740  NA 138 141 137  K 3.2* 5.0 4.3  CL 100 106 100  CO2 26 27 26   GLUCOSE 127* 106* 122*  BUN 18 16 43*  CREATININE 0.67 0.68 0.85  CALCIUM 9.1 9.4 9.2   Liver Function Tests: No results for input(s): AST, ALT, ALKPHOS, BILITOT, PROT, ALBUMIN in the last 168 hours. No results for input(s): LIPASE, AMYLASE in the last 168 hours. No results for input(s): AMMONIA in the last 168 hours. CBC: Recent Labs  Lab 10/26/21 1402 10/27/21 0400  WBC 11.5* 8.5  HGB 12.7 13.4  HCT 39.5 41.0  MCV 89.8 90.1  PLT 158 163   Cardiac Enzymes: No results for input(s): CKTOTAL, CKMB, CKMBINDEX, TROPONINI in the last 168 hours. BNP: BNP (last 3 results) Recent Labs    03/23/21 1016 03/25/21 1651 10/26/21 1530  BNP 393.7* 169.7* 764.6*    ProBNP (last 3 results) No results for input(s): PROBNP in the last 8760 hours.  CBG: No results for input(s): GLUCAP in the last 168 hours.     Signed:  Phillips Grout MD.  [facility] 10/29/2021, 5:16 PM

## 2021-10-29 NOTE — Plan of Care (Signed)
  Problem: Cardiac: Goal: Ability to achieve and maintain adequate cardiopulmonary perfusion will improve Outcome: Progressing   

## 2021-10-29 NOTE — Progress Notes (Signed)
Progress Note  Patient Name: Laurie Pugh Date of Encounter: 10/29/2021  [facility] Cardiologist: Chrystie Nose, MD   Subjective   Breathing is good. No CP. Significant bad pain at the location where she fell in the past  Inpatient Medications    Scheduled Meds:  apixaban  2.5 mg Oral BID   benazepril  10 mg Oral Daily   budesonide (PULMICORT) nebulizer solution  0.25 mg Nebulization BID   doxycycline  100 mg Oral Q12H   feeding supplement  237 mL Oral TID BM   furosemide  40 mg Intravenous BID   guaiFENesin  1,200 mg Oral BID   loratadine  10 mg Oral Daily   metoprolol tartrate  50 mg Oral BID   multivitamin with minerals  1 tablet Oral Daily   pantoprazole  40 mg Oral Daily   rosuvastatin  10 mg Oral Daily   Continuous Infusions:  PRN Meds: ipratropium, LORazepam, oxyCODONE-acetaminophen   Vital Signs    Vitals:   10/28/21 2154 10/28/21 2333 10/29/21 0343 10/29/21 0802  BP:  99/62 99/63   Pulse:  82 73   Resp:  18 20 16   Temp:  97.6 F (36.4 C) 97.6 F (36.4 C)   TempSrc:  Oral Oral   SpO2: 100% 95% 92%   Weight:   41.7 kg     Intake/Output Summary (Last 24 hours) at 10/29/2021 0833 Last data filed at 10/29/2021 0300 Gross per 24 hour  Intake 960 ml  Output 1200 ml  Net -240 ml   Last 3 Weights 10/29/2021 10/28/2021 06/24/2021  Weight (lbs) 91 lb 14.4 oz 93 lb 6.4 oz 101 lb  Weight (kg) 41.686 kg 42.366 kg 45.813 kg      Telemetry    Atrial fibrillation, rate controlled, HR 70-80s - Personally Reviewed  ECG    Atrial fibrillation, rate controlled - Personally Reviewed  Physical Exam   GEN: No acute distress.   Neck: No JVD Cardiac: irregularly irregular, no murmurs, rubs, or gallops.  Respiratory: Clear to auscultation bilaterally. GI: Soft, nontender, non-distended  MS: No edema; No deformity. Neuro:  Nonfocal  Psych: Normal affect   Labs    High Sensitivity Troponin:  No results for input(s): TROPONINIHS in the last 720 hours.    Chemistry Recent Labs  Lab 10/26/21 1402 10/27/21 0400 10/29/21 0740  NA 138 141 137  K 3.2* 5.0 4.3  CL 100 106 100  CO2 26 27 26   GLUCOSE 127* 106* 122*  BUN 18 16 43*  CREATININE 0.67 0.68 0.85  CALCIUM 9.1 9.4 9.2  GFRNONAA >60 >60 >60  ANIONGAP 12 8 11     Lipids  Recent Labs  Lab 10/27/21 0400  CHOL 103  TRIG 69  HDL 41  LDLCALC 48  CHOLHDL 2.5    Hematology Recent Labs  Lab 10/26/21 1402 10/27/21 0400  WBC 11.5* 8.5  RBC 4.40 4.55  HGB 12.7 13.4  HCT 39.5 41.0  MCV 89.8 90.1  MCH 28.9 29.5  MCHC 32.2 32.7  RDW 14.4 14.6  PLT 158 163   Thyroid  Recent Labs  Lab 10/26/21 1550  TSH 1.056    BNP Recent Labs  Lab 10/26/21 1530  BNP 764.6*    DDimer No results for input(s): DDIMER in the last 168 hours.   Radiology    ECHOCARDIOGRAM COMPLETE  Result Date: 10/27/2021    ECHOCARDIOGRAM REPORT   Patient Name:   Laurie Pugh Date of Exam: 10/27/2021 Medical Rec #:  [id number]         Height:       63.0 in Accession #:    [id number]        Weight:       101.0 lb Date of Birth:  1932/08/16        BSA:          1.446 m Patient Age:    89 years          BP:           113/63 mmHg Patient Gender: F                 HR:           69 bpm. Exam Location:  Inpatient Procedure: 2D Echo Indications:    Acute diastolic CHF  History:        Patient has prior history of Echocardiogram examinations, most                 recent 03/22/2021. Angina; Risk Factors:Dyslipidemia and                 Hypertension. SVT                 PAF.  Sonographer:    Devonne DoughtyStacey Plante Referring Phys: [id number] Emeline GeneralPING T ZHANG IMPRESSIONS  1. Left ventricular ejection fraction, by estimation, is 60 to 65%. The left ventricle has normal function. The left ventricle has no regional wall motion abnormalities. There is mild concentric left ventricular hypertrophy. Left ventricular diastolic parameters are consistent with Grade III diastolic dysfunction (restrictive). Elevated left atrial pressure.  2. Right  ventricular systolic function is mildly reduced. The right ventricular size is normal. There is moderately elevated pulmonary artery systolic pressure.  3. Left atrial size was mildly dilated.  4. Right atrial size was moderately dilated.  5. The mitral valve is normal in structure. Trivial mitral valve regurgitation. No evidence of mitral stenosis.  6. Tricuspid valve regurgitation is moderate.  7. The aortic valve is tricuspid. Aortic valve regurgitation is mild to moderate. Aortic valve sclerosis is present, with no evidence of aortic valve stenosis. Aortic regurgitation PHT measures 432 msec.  8. The inferior vena cava is normal in size with <50% respiratory variability, suggesting right atrial pressure of 8 mmHg. FINDINGS  Left Ventricle: Left ventricular ejection fraction, by estimation, is 60 to 65%. The left ventricle has normal function. The left ventricle has no regional wall motion abnormalities. The left ventricular internal cavity size was normal in size. There is  mild concentric left ventricular hypertrophy. Left ventricular diastolic parameters are consistent with Grade III diastolic dysfunction (restrictive). Elevated left atrial pressure. Right Ventricle: The right ventricular size is normal. No increase in right ventricular wall thickness. Right ventricular systolic function is mildly reduced. There is moderately elevated pulmonary artery systolic pressure. The tricuspid regurgitant velocity is 3.44 m/s, and with an assumed right atrial pressure of 8 mmHg, the estimated right ventricular systolic pressure is 55.3 mmHg. Left Atrium: Left atrial size was mildly dilated. Right Atrium: Right atrial size was moderately dilated. Pericardium: There is no evidence of pericardial effusion. Mitral Valve: The mitral valve is normal in structure. Mild mitral annular calcification. Trivial mitral valve regurgitation. No evidence of mitral valve stenosis. MV peak gradient, 10.7 mmHg. The mean mitral valve  gradient is 4.0 mmHg. Tricuspid Valve: The tricuspid valve is normal in structure. Tricuspid valve regurgitation is moderate . No evidence of tricuspid stenosis. There is mild prolapse of  the tricuspid anterior and septal leaflets. Aortic Valve: The aortic valve is tricuspid. Aortic valve regurgitation is mild to moderate. Aortic regurgitation PHT measures 432 msec. Aortic valve sclerosis is present, with no evidence of aortic valve stenosis. Aortic valve mean gradient measures 2.0  mmHg. Aortic valve peak gradient measures 4.5 mmHg. Aortic valve area, by VTI measures 2.40 cm. Pulmonic Valve: The pulmonic valve was not well visualized. Pulmonic valve regurgitation is not visualized. No evidence of pulmonic stenosis. Aorta: The aortic root is normal in size and structure. Venous: The inferior vena cava is normal in size with less than 50% respiratory variability, suggesting right atrial pressure of 8 mmHg. IAS/Shunts: No atrial level shunt detected by color flow Doppler.  LEFT VENTRICLE PLAX 2D LVIDd:         2.70 cm   Diastology LVIDs:         2.00 cm   LV e' medial:    6.31 cm/s LV PW:         1.00 cm   LV E/e' medial:  18.9 LV IVS:        1.10 cm   LV e' lateral:   6.96 cm/s LVOT diam:     1.90 cm   LV E/e' lateral: 17.1 LV SV:         45 LV SV Index:   31 LVOT Area:     2.84 cm  RIGHT VENTRICLE            IVC RV Basal diam:  4.00 cm    IVC diam: 1.90 cm RV Mid diam:    3.40 cm RV S prime:     7.94 cm/s TAPSE (M-mode): 1.2 cm LEFT ATRIUM             Index        RIGHT ATRIUM           Index LA diam:        3.80 cm 2.63 cm/m   RA Area:     22.10 cm LA Vol (A2C):   62.0 ml 42.87 ml/m  RA Volume:   64.90 ml  44.87 ml/m LA Vol (A4C):   49.1 ml 33.95 ml/m LA Biplane Vol: 56.8 ml 39.27 ml/m  AORTIC VALVE AV Area (Vmax):    2.46 cm AV Area (Vmean):   2.30 cm AV Area (VTI):     2.40 cm AV Vmax:           106.00 cm/s AV Vmean:          72.700 cm/s AV VTI:            0.188 m AV Peak Grad:      4.5 mmHg AV Mean  Grad:      2.0 mmHg LVOT Vmax:         91.90 cm/s LVOT Vmean:        59.100 cm/s LVOT VTI:          0.159 m LVOT/AV VTI ratio: 0.85 AI PHT:            432 msec  AORTA Ao Root diam: 3.40 cm Ao Asc diam:  3.30 cm MITRAL VALVE                TRICUSPID VALVE MV Area (PHT): 3.16 cm     TR Peak grad:   47.3 mmHg MV Area VTI:   1.57 cm     TR Vmax:        344.00 cm/s MV Peak  grad:  10.7 mmHg MV Mean grad:  4.0 mmHg     SHUNTS MV Vmax:       1.64 m/s     Systemic VTI:  0.16 m MV Vmean:      87.4 cm/s    Systemic Diam: 1.90 cm MV Decel Time: 240 msec MV E velocity: 119.00 cm/s Lavona MoundKardie Tobb DO Electronically signed by Thomasene RippleKardie Tobb DO Signature Date/Time: 10/27/2021/6:19:21 PM    Final     Cardiac Studies   Echo 10/27/2021  1. Left ventricular ejection fraction, by estimation, is 60 to 65%. The  left ventricle has normal function. The left ventricle has no regional  wall motion abnormalities. There is mild concentric left ventricular  hypertrophy. Left ventricular diastolic  parameters are consistent with Grade III diastolic dysfunction  (restrictive). Elevated left atrial pressure.   2. Right ventricular systolic function is mildly reduced. The right  ventricular size is normal. There is moderately elevated pulmonary artery  systolic pressure.   3. Left atrial size was mildly dilated.   4. Right atrial size was moderately dilated.   5. The mitral valve is normal in structure. Trivial mitral valve  regurgitation. No evidence of mitral stenosis.   6. Tricuspid valve regurgitation is moderate.   7. The aortic valve is tricuspid. Aortic valve regurgitation is mild to  moderate. Aortic valve sclerosis is present, with no evidence of aortic  valve stenosis. Aortic regurgitation PHT measures 432 msec.   8. The inferior vena cava is normal in size with <50% respiratory  variability, suggesting right atrial pressure of 8 mmHg.   Patient Profile     88 y.o. female with PMH of permanent atrial fibrillation on  Eliquis, PSVT, HTN and HLD presented with progressive dyspnea. CXR showed R basilar opacity.   Assessment & Plan    Acute on chronic diastolic heart  - given 40mg  BID IV lasix yesterday due to elevated R chamber pressure on echo suspicious for acute CHF  - weight down from 93.4 lbs to 91.9 lbs. Will discuss with MD, she appears to be euvolemic on exam today. She says her breathing is close to her baseline as well. Physical exam reveals diminished breath sound in left base, consider repeat CXR.   Permanent atrial fibrillation: rate controlled. On Eliquis, 2.5mg  dosage given weight and age  R sided PNA: treated with doxy  - on exam, right lung is clear. Diminished breath sound in the left base of the lung, consider repeat chest x ray.   HTN: consider D/C benazepril given low BP. Continue metoprolol for BP and rate control   HLD      For questions or updates, please contact [facility] Please consult [email or web address] for contact info under        Signed, Azalee CourseHao Jalayne Ganesh, PA  10/29/2021, 8:33 AM

## 2021-10-29 NOTE — TOC Transition Note (Addendum)
Transition of Care [facility]) - CM/SW Discharge Note   Patient Details  Name: Laurie Pugh MRN: [id number] Date of Birth: [birth year]  [facility] CM/SW Contact:  Lockie Pares, RN Phone Number: 10/29/2021, 3:13 PM   Clinical Narrative:    Spoke to patient about HH. She stated what ever was best with insurance.  She asked me to call her daughter. Called Clydie Braun  per request.  Clydie Braun voiced how difficult it can be  working with her mother. She really believes her mother needs ASL but she will not go, expects meals to be brought to her (home cooked) by family. Daughter is feeling the strain.  Discussed home health and what they normally will do, when they contact patient etc. Contacted [facility] for [facility] PT OT RN. RN is for disease management and medication.Iantha Fallen accepted.  Final next level of care: Home w Home Health Services Barriers to Discharge: No Barriers Identified   Patient Goals and CMS Choice        Discharge Placement             DC with home health          Discharge Plan and Services   Discharge Planning Services: CM Consult                      HH Arranged: PT, OT, RN   Date [facility] Agency Contacted: 10/29/21 Time HH Agency Contacted: [id number] Representative spoke with at [facility] Agency: Amy  Social Determinants of Health (SDOH) Interventions     Readmission Risk Interventions No flowsheet data found.

## 2021-10-31 LAB — CULTURE, BLOOD (ROUTINE X 2)
Culture: NO GROWTH
Culture: NO GROWTH
Special Requests: ADEQUATE

## 2021-11-07 NOTE — Progress Notes (Signed)
HEART & VASCULAR TRANSITION OF CARE CONSULT NOTE     Referring Physician:Dr Hilty  Primary Care: Dr Waynard EdwardsPerini Primary Cardiologist: Dr Rennis GoldenHilty  HPI: Referred to clinic by Dr Rennis GoldenHilty  for heart failure consultation  Laurie Pugh is a 88 year old with a history of HTN, chronic atrial fib, chronic  pain, paroxysmal SVT, sinus brady, carotid disease, and diastolic heart failure.  Admitted 10/26/21 with increases shortness of breath in the setting of CAP and a/c diastolic heart failure. Treated with antibiotics. Had prolonged QTc so doxycline was used at d/c for 5 days.  Diuresed with 10 pounds with IV lasix and transitioned to lasix 40 mg po daily. Discharge weight 91.9 pounds.   She presents today with her daughter. Complaining of some fatigue and dizziness.  Denies PND/Orthopnea. Appetite poor. No fever or chills. Weight at home 88-91 pounds. Taking all medications.   Cardiac Testing  Echo 10/27/21 EF 60-65 % Grade III DD Echo 02/2021 EF 60-65%.   Review of Systems: [y] = yes, [ ]  = no   General: Weight gain [ ] ; Weight loss [ ] ; Anorexia [ ] ; Fatigue [ Y]; Fever [ ] ; Chills [ ] ; Weakness [ Y]  Cardiac: Chest pain/pressure [ ] ; Resting SOB [ ] ; Exertional SOB [ ] ; Orthopnea [ ] ; Pedal Edema [ ] ; Palpitations [ ] ; Syncope [ ] ; Presyncope [ ] ; Paroxysmal nocturnal dyspnea[ ]   Pulmonary: Cough [ ] ; Wheezing[ ] ; Hemoptysis[ ] ; Sputum [ ] ; Snoring [ ]   GI: Vomiting[ ] ; Dysphagia[ ] ; Melena[ ] ; Hematochezia [ ] ; Heartburn[ ] ; Abdominal pain [ ] ; Constipation [ ] ; Diarrhea [ ] ; BRBPR [ ]   GU: Hematuria[ ] ; Dysuria [ ] ; Nocturia[ ]   Vascular: Pain in legs with walking [ ] ; Pain in feet with lying flat [ ] ; Non-healing sores [ ] ; Stroke [ ] ; TIA [ ] ; Slurred speech [ ] ;  Neuro: Headaches[ ] ; Vertigo[ ] ; Seizures[ ] ; Paresthesias[ ] ;Blurred vision [ ] ; Diplopia [ ] ; Vision changes [ ]   Ortho/Skin: Arthritis [ ] ; Joint pain [ Y]; Muscle pain [ ] ; Joint swelling [ ] ; Back Pain [Y ]; Rash [ ]   Psych:  Depression[ ] ; Anxiety[ ]   Heme: Bleeding problems [ ] ; Clotting disorders [ ] ; Anemia [ ]   Endocrine: Diabetes [ ] ; Thyroid dysfunction[ ]    Past Medical History:  Diagnosis Date   Allergy    Angina pectoris, unstable (HCC)    Cataract    Dyslipidemia    Esophageal stricture    GERD (gastroesophageal reflux disease)    Hyperlipidemia    Hypertension    Neck pain    PAF (paroxysmal atrial fibrillation) (HCC)    PSVT (paroxysmal supraventricular tachycardia) (HCC)     Current Outpatient Medications  Medication Sig Dispense Refill   acetaminophen (TYLENOL) 325 MG tablet Take 650 mg by mouth every 4 (four) hours as needed for moderate pain, mild pain or headache.     ANORO ELLIPTA 62.5-25 MCG/ACT AEPB Inhale 1 puff into the lungs daily.     apixaban (ELIQUIS) 5 MG TABS tablet Take 2.5 mg by mouth 2 (two) times daily.     barrier cream (NON-SPECIFIED) CREA Apply 1 application topically 2 (two) times daily as needed (buttocks).     fluticasone (FLONASE) 50 MCG/ACT nasal spray Place 1 spray into both nostrils at bedtime.     furosemide (LASIX) 40 MG tablet Take 1 tablet (40 mg total) by mouth daily. 30 tablet 0   furosemide (LASIX) 40 MG tablet Patient takes  1 tablet one day and 1/2 tablet by mouth every other day.     metoprolol tartrate (LOPRESSOR) 50 MG tablet Take 1 tablet (50 mg total) by mouth 2 (two) times daily. 60 tablet 0   omeprazole (PRILOSEC) 40 MG capsule TAKE 1 CAPSULE BY MOUTH  DAILY 90 capsule 1   oxyCODONE-acetaminophen (PERCOCET/ROXICET) 5-325 MG tablet Take 1 tablet by mouth every 6 (six) hours as needed (pain).      polyvinyl alcohol (LIQUIFILM TEARS) 1.4 % ophthalmic solution Place 1 drop into both eyes as needed for dry eyes. 15 mL 0   rosuvastatin (CRESTOR) 10 MG tablet Take 10 mg by mouth every evening.     No current facility-administered medications for this encounter.    Allergies  Allergen Reactions   Codeine Nausea And Vomiting   Erythromycin Other  (See Comments)    Mycins cause severe nausea/vomiting and diarrhea (thinks it was erythromycin)      Social History   Socioeconomic History   Marital status: Widowed    Spouse name: Not on file   Number of children: 4   Years of education: Not on file   Highest education level: Not on file  Occupational History   Occupation: retired  Tobacco Use   Smoking status: Former    Packs/day: 1.00    Years: 40.00    Pack years: 40.00    Types: Cigarettes    Quit date: 06/25/2003    Years since quitting: 18.3   Smokeless tobacco: Never  Vaping Use   Vaping Use: Never used  Substance and Sexual Activity   Alcohol use: No   Drug use: No   Sexual activity: Not on file  Other Topics Concern   Not on file  Social History Narrative   Not on file   Social Determinants of Health   Financial Resource Strain: Not on file  Food Insecurity: Not on file  Transportation Needs: Not on file  Physical Activity: Not on file  Stress: Not on file  Social Connections: Not on file  Intimate Partner Violence: Not on file      Family History  Problem Relation Age of Onset   Heart disease Mother    Heart disease Sister    Suicidality Brother    Cancer Sister    COPD Sister    Suicidality Child    Colon cancer Neg Hx    Colon polyps Neg Hx    Esophageal cancer Neg Hx    Rectal cancer Neg Hx    Stomach cancer Neg Hx     Vitals:   11/08/21 1059  BP: 108/64  Pulse: 74  SpO2: 93%  Weight: 42 kg   Wt Readings from Last 3 Encounters:  11/08/21 42 kg  10/29/21 41.7 kg  06/24/21 45.8 kg    PHYSICAL EXAM: General:  Thin .  No respiratory difficulty. Walked in the clinic  HEENT: normal Neck: supple. no JVD. Carotids 2+ bilat; no bruits. No lymphadenopathy or thryomegaly appreciated. Cor: PMI nondisplaced. Irregular rate & rhythm. No rubs, gallops or murmurs. Lungs: clear Abdomen: soft, nontender, nondistended. No hepatosplenomegaly. No bruits or masses. Good bowel  sounds. Extremities: no cyanosis, clubbing, rash, edema Neuro: alert & oriented x 3, cranial nerves grossly intact. moves all 4 extremities w/o difficulty. Affect pleasant.  ECG: A Fib 76 bpm QTc 452    ASSESSMENT & PLAN: Chronic Diastolic HF [id number] Echo Ef 60-65%. Grade III DD NYHA II-III. Reds Clip 18 % which is low and not suggestive  of fluid accumulation. Will need to hold lasix x2 days then start lasix 20 mg daily.  - Discussed low salt food choices.  - Check BMET   2. Permanent A Fib  -Rate controlled on metoprolol.  Remains on eliquis reduced dose with weight and age.   - EKG today  -Check BMET at her PCP today. Marland Kitchen    Referred to HFSW (PCP, Medications, Transportation, ETOH Abuse, Drug Abuse, Insurance, Financial ):  No Refer to Pharmacy: No Refer to Home Health:  No Refer to Advanced Heart Failure Clinic: Yes or no  Refer to General Cardiology: established with Dr Debara Pickett   Follow up as needed.   Gaynel Schaafsma NP-C  11:30 AM

## 2021-11-08 ENCOUNTER — Encounter (HOSPITAL_COMMUNITY): Payer: Self-pay

## 2021-11-08 ENCOUNTER — Other Ambulatory Visit: Payer: Self-pay

## 2021-11-08 ENCOUNTER — Ambulatory Visit (HOSPITAL_COMMUNITY)
Admit: 2021-11-08 | Discharge: 2021-11-08 | Disposition: A | Discharge status: Home / Self Care | Payer: Medicare Other | Attending: Adult Health | Admitting: Adult Health

## 2021-11-08 VITALS — BP 108/64 | HR 74 | Wt 92.6 lb

## 2021-11-08 DIAGNOSIS — I482 Chronic atrial fibrillation, unspecified: Secondary | ICD-10-CM

## 2021-11-08 DIAGNOSIS — I5032 Chronic diastolic (congestive) heart failure: Secondary | ICD-10-CM | POA: Diagnosis present

## 2021-11-08 DIAGNOSIS — Z7901 Long term (current) use of anticoagulants: Secondary | ICD-10-CM | POA: Diagnosis not present

## 2021-11-08 DIAGNOSIS — I4821 Permanent atrial fibrillation: Secondary | ICD-10-CM | POA: Insufficient documentation

## 2021-11-08 DIAGNOSIS — I110 Hypertensive heart disease with heart failure: Secondary | ICD-10-CM | POA: Insufficient documentation

## 2021-11-08 MED ORDER — FUROSEMIDE 40 MG PO TABS: 20.0000 mg | ORAL_TABLET | Freq: Every day | ORAL | 0 refills | Status: AC

## 2021-11-08 NOTE — Progress Notes (Signed)
ReDS Vest / Clip - 11/08/21 1100       ReDS Vest / Clip   Station Marker A    Ruler Value 20    ReDS Value Range Low volume    ReDS Actual Value 18

## 2021-11-08 NOTE — Patient Instructions (Addendum)
HOLD Furosemide (Lasix) FOR 2 DAYS ONLY, then restart at 20 mg Daily  Thank you for allowing [place] to provider your heart failure care after your recent hospitalization. Please follow-up with your Primary Care Provider.

## 2021-12-06 ENCOUNTER — Encounter (HOSPITAL_COMMUNITY): Payer: Medicare Other

## 2022-04-14 ENCOUNTER — Other Ambulatory Visit: Payer: Self-pay

## 2022-04-14 DIAGNOSIS — M810 Age-related osteoporosis without current pathological fracture: Secondary | ICD-10-CM | POA: Insufficient documentation

## 2022-06-17 ENCOUNTER — Other Ambulatory Visit (HOSPITAL_COMMUNITY): Payer: Self-pay | Admitting: Internal Medicine

## 2022-06-17 ENCOUNTER — Ambulatory Visit (HOSPITAL_COMMUNITY)
Admission: RE | Admit: 2022-06-17 | Discharge: 2022-06-17 | Disposition: A | Discharge status: Home / Self Care | Payer: Medicare Other | Source: Ambulatory Visit | Attending: Vascular Surgery | Admitting: Vascular Surgery

## 2022-06-17 DIAGNOSIS — M79604 Pain in right leg: Secondary | ICD-10-CM | POA: Insufficient documentation

## 2022-07-13 ENCOUNTER — Telehealth: Payer: Self-pay | Admitting: Pharmacy Technician

## 2022-07-13 NOTE — Telephone Encounter (Signed)
[facility] referral has been closed due to several attempts to collect clinicals from MD office with no success.

## 2022-08-29 NOTE — Progress Notes (Unsigned)
[facility] [place]  ASSESSMENT / PLAN: Laurie Pugh is a 88 y.o. female with chronic venous insufficiency of bilateral lower extremities with prominent reticular veins in the feet and dependent rubor.  Clinical exam and noninvasive testing shows good arterial inflow to the level of the feet.  I counseled her about these benign findings.  Encouraged her to compress and elevate the lower extremities as needed for swelling or discomfort.  She can follow-up with me on an as-needed basis.  CHIEF COMPLAINT: Dependent rubor  HISTORY OF PRESENT ILLNESS: Laurie Pugh is a 88 y.o. female referred to clinic for evaluation of dependent rubor and prominent venous insufficiency of the bilateral lower extremities.  The patient is a very pleasant elderly woman with few medical comorbidities.  She has no symptoms typical of symptomatic peripheral arterial disease.  Her only complaint is dependent rubor in her feet and prominent veins across the feet and ankles.   Past Medical History:  Diagnosis Date   Allergy    Angina pectoris, unstable (HCC)    Cataract    Dyslipidemia    Esophageal stricture    GERD (gastroesophageal reflux disease)    Hyperlipidemia    Hypertension    Neck pain    PAF (paroxysmal atrial fibrillation) (HCC)    PSVT (paroxysmal supraventricular tachycardia) (HCC)     Past Surgical History:  Procedure Laterality Date   ABDOMINAL HYSTERECTOMY     APPENDECTOMY     BLADDER SURGERY     bladder surgery   BREAST EXCISIONAL BIOPSY     CARDIAC CATHETERIZATION  06/28/2004   no significant CAD (Dr. Laurell Josephs)   CATARACT EXTRACTION, BILATERAL  2008   CYSTOSCOPY W/ RETROGRADES Bilateral 04/11/2016   Procedure: CYSTOSCOPY WITH BILATERAL RETROGRADE PYELOGRAM;  Surgeon: Hildred Laser, MD;  Location: [facility];  Service: Urology;  Laterality: Bilateral;   ESOPHAGOGASTRODUODENOSCOPY N/A 01/30/2017   Procedure: ESOPHAGOGASTRODUODENOSCOPY (EGD);  Surgeon:  Hilarie Fredrickson, MD;  Location: [facility];  Service: Endoscopy;  Laterality: N/A;   ESOPHAGOGASTRODUODENOSCOPY N/A 01/30/2017   Procedure: ESOPHAGOGASTRODUODENOSCOPY (EGD);  Surgeon: Napoleon Form, MD;  Location: [facility];  Service: Endoscopy;  Laterality: N/A;   IR VERTEBROPLASTY LUMBAR BX INC UNI/BIL INC/INJECT/IMAGING  03/15/2019   TRANSTHORACIC ECHOCARDIOGRAM  2009   borderline conc LVH; trace MR; mild-mod TR, RVSP 30-88mmHg    Family History  Problem Relation Age of Onset   Heart disease Mother    Heart disease Sister    Suicidality Brother    Cancer Sister    COPD Sister    Suicidality Child    Colon cancer Neg Hx    Colon polyps Neg Hx    Esophageal cancer Neg Hx    Rectal cancer Neg Hx    Stomach cancer Neg Hx     Social History   Socioeconomic History   Marital status: Widowed    Spouse name: Not on file   Number of children: 4   Years of education: Not on file   Highest education level: Not on file  Occupational History   Occupation: retired  Tobacco Use   Smoking status: Former    Packs/day: 1.00    Years: 40.00    Total pack years: 40.00    Types: Cigarettes    Quit date: 06/25/2003    Years since quitting: 19.1   Smokeless tobacco: Never  Vaping Use   Vaping Use: Never used  Substance and Sexual Activity   Alcohol use: No  Drug use: No   Sexual activity: Not on file  Other Topics Concern   Not on file  Social History Narrative   Not on file   Social Determinants of Health   Financial Resource Strain: Not on file  Food Insecurity: Not on file  Transportation Needs: Not on file  Physical Activity: Not on file  Stress: Not on file  Social Connections: Not on file  Intimate Partner Violence: Not on file    Allergies  Allergen Reactions   Codeine Nausea And Vomiting   Erythromycin Other (See Comments)    Mycins cause severe nausea/vomiting and diarrhea (thinks it was erythromycin)    Current Outpatient Medications  Medication Sig  Dispense Refill   acetaminophen (TYLENOL) 325 MG tablet Take 650 mg by mouth every 4 (four) hours as needed for moderate pain, mild pain or headache.     ANORO ELLIPTA 62.5-25 MCG/ACT AEPB Inhale 1 puff into the lungs daily.     apixaban (ELIQUIS) 5 MG TABS tablet Take 2.5 mg by mouth 2 (two) times daily.     barrier cream (NON-SPECIFIED) CREA Apply 1 application topically 2 (two) times daily as needed (buttocks).     fluticasone (FLONASE) 50 MCG/ACT nasal spray Place 1 spray into both nostrils at bedtime.     furosemide (LASIX) 40 MG tablet Take 0.5 tablets (20 mg total) by mouth daily. 30 tablet 0   metoprolol tartrate (LOPRESSOR) 50 MG tablet Take 1 tablet (50 mg total) by mouth 2 (two) times daily. 60 tablet 0   omeprazole (PRILOSEC) 40 MG capsule TAKE 1 CAPSULE BY MOUTH  DAILY 90 capsule 1   oxyCODONE-acetaminophen (PERCOCET/ROXICET) 5-325 MG tablet Take 1 tablet by mouth every 6 (six) hours as needed (pain).      polyvinyl alcohol (LIQUIFILM TEARS) 1.4 % ophthalmic solution Place 1 drop into both eyes as needed for dry eyes. 15 mL 0   rosuvastatin (CRESTOR) 10 MG tablet Take 10 mg by mouth every evening.     No current facility-administered medications for this visit.    PHYSICAL EXAM Vitals:   08/30/22 1006  BP: 138/76  Pulse: 72  Resp: 20  Temp: 97.8 F (36.6 C)  SpO2: 91%  Weight: 96 lb 8 oz (43.8 kg)  Height: 5\' 3"  (1.6 m)    Elderly woman in no acute distress Regular rate and rhythm Unlabored breathing Palpable dorsalis pedis pulses bilaterally Dependent rubor and feet bilaterally Prominent reticular veins in feet bilaterally     PERTINENT LABORATORY AND RADIOLOGIC DATA  Most recent CBC    Latest Ref Rng & Units 10/27/2021    4:00 AM 10/26/2021    2:02 PM 06/24/2021    3:50 PM  CBC  WBC 4.0 - 10.5 K/uL 8.5  11.5  9.5   Hemoglobin 12.0 - 15.0 g/dL 06/26/2021  16.1  09.6   Hematocrit 36.0 - 46.0 % 41.0  39.5  43.9   Platelets 150 - 400 K/uL 163  158  225      Most  recent CMP    Latest Ref Rng & Units 10/29/2021    7:40 AM 10/27/2021    4:00 AM 10/26/2021    2:02 PM  CMP  Glucose 70 - 99 mg/dL 10/28/2021  409  811   BUN 8 - 23 mg/dL 43  16  18   Creatinine 0.44 - 1.00 mg/dL 914  7.82  9.56   Sodium 135 - 145 mmol/L 137  141  138   Potassium 3.5 -  5.1 mmol/L 4.3  5.0  3.2   Chloride 98 - 111 mmol/L 100  106  100   CO2 22 - 32 mmol/L 26  27  26    Calcium 8.9 - 10.3 mg/dL 9.2  9.4  9.1     Renal function CrCl cannot be calculated (Patient's most recent lab result is older than the maximum 21 days allowed.).  No results found for: "HGBA1C"  LDL Cholesterol  Date Value Ref Range Status  10/27/2021 48 0 - 99 mg/dL Final    Comment:           Total Cholesterol/HDL:CHD Risk Coronary Heart Disease Risk Table                     Men   Women  1/2 Average Risk   3.4   3.3  Average Risk       5.0   4.4  2 X Average Risk   9.6   7.1  3 X Average Risk  23.4   11.0        Use the calculated Patient Ratio above and the CHD Risk Table to determine the patient's CHD Risk.        ATP III CLASSIFICATION (LDL):  <100     mg/dL   Optimal  12/25/2021  mg/dL   Near or Above                    Optimal  130-159  mg/dL   Borderline  970-263  mg/dL   High  785-885     mg/dL   Very High Performed at [facility], [address], [place], [place]       +-------+-----------+-----------+------------+------------+  ABI/TBIToday's ABIToday's TBIPrevious ABIPrevious TBI  +-------+-----------+-----------+------------+------------+  Right 0.99       0.82                                 +-------+-----------+-----------+------------+------------+  Left  [place]         0.65                                 +-------+-----------+-----------+------------+------------+    [id number]. Rande Brunt, MD FACS [facility] Phone Number: [phone] 08/29/2022 8:07 PM   Total time spent on preparing this encounter  including chart review, data review, collecting history, examining the patient, coordinating care for this new patient, 45 minutes.  Portions of this report may have been transcribed using voice recognition software.  Every effort has been made to ensure accuracy; however, inadvertent computerized transcription errors may still be present.

## 2022-08-30 ENCOUNTER — Ambulatory Visit: Payer: Medicare Other | Admitting: Vascular Surgery

## 2022-08-30 ENCOUNTER — Encounter: Payer: Self-pay | Admitting: Vascular Surgery

## 2022-08-30 VITALS — BP 138/76 | HR 72 | Temp 97.8°F | Resp 20 | Ht 63.0 in | Wt 96.5 lb

## 2022-08-30 DIAGNOSIS — I872 Venous insufficiency (chronic) (peripheral): Secondary | ICD-10-CM | POA: Diagnosis not present

## 2022-09-16 ENCOUNTER — Emergency Department (HOSPITAL_COMMUNITY)
Admission: EM | Admit: 2022-09-16 | Discharge: 2022-09-16 | Disposition: A | Discharge status: Home / Self Care | Payer: Medicare Other | Attending: Emergency Medicine | Admitting: Emergency Medicine

## 2022-09-16 ENCOUNTER — Other Ambulatory Visit: Payer: Self-pay

## 2022-09-16 ENCOUNTER — Encounter (HOSPITAL_COMMUNITY): Payer: Self-pay

## 2022-09-16 ENCOUNTER — Emergency Department (HOSPITAL_COMMUNITY): Payer: Medicare Other

## 2022-09-16 DIAGNOSIS — I110 Hypertensive heart disease with heart failure: Secondary | ICD-10-CM | POA: Diagnosis not present

## 2022-09-16 DIAGNOSIS — R062 Wheezing: Secondary | ICD-10-CM | POA: Diagnosis not present

## 2022-09-16 DIAGNOSIS — Z79899 Other long term (current) drug therapy: Secondary | ICD-10-CM | POA: Diagnosis not present

## 2022-09-16 DIAGNOSIS — B974 Respiratory syncytial virus as the cause of diseases classified elsewhere: Secondary | ICD-10-CM | POA: Diagnosis not present

## 2022-09-16 DIAGNOSIS — I509 Heart failure, unspecified: Secondary | ICD-10-CM | POA: Insufficient documentation

## 2022-09-16 DIAGNOSIS — B338 Other specified viral diseases: Secondary | ICD-10-CM | POA: Insufficient documentation

## 2022-09-16 DIAGNOSIS — Z1152 Encounter for screening for COVID-19: Secondary | ICD-10-CM | POA: Insufficient documentation

## 2022-09-16 DIAGNOSIS — I4891 Unspecified atrial fibrillation: Secondary | ICD-10-CM | POA: Diagnosis not present

## 2022-09-16 DIAGNOSIS — R059 Cough, unspecified: Secondary | ICD-10-CM | POA: Diagnosis present

## 2022-09-16 DIAGNOSIS — E871 Hypo-osmolality and hyponatremia: Secondary | ICD-10-CM | POA: Diagnosis not present

## 2022-09-16 DIAGNOSIS — Z7901 Long term (current) use of anticoagulants: Secondary | ICD-10-CM | POA: Insufficient documentation

## 2022-09-16 LAB — COMPREHENSIVE METABOLIC PANEL
ALT: 12 U/L (ref 0–44)
AST: 23 U/L (ref 15–41)
Albumin: 3.6 g/dL (ref 3.5–5.0)
Alkaline Phosphatase: 67 U/L (ref 38–126)
Anion gap: 9 (ref 5–15)
BUN: 14 mg/dL (ref 8–23)
CO2: 23 mmol/L (ref 22–32)
Calcium: 9.3 mg/dL (ref 8.9–10.3)
Chloride: 96 mmol/L — ABNORMAL LOW (ref 98–111)
Creatinine, Ser: 0.73 mg/dL (ref 0.44–1.00)
GFR, Estimated: >60 mL/min (ref >60)
Glucose, Bld: 106 mg/dL — ABNORMAL HIGH (ref 70–99)
Potassium: 3.9 mmol/L (ref 3.5–5.1)
Sodium: 128 mmol/L — ABNORMAL LOW (ref 135–145)
Total Bilirubin: 1.1 mg/dL (ref 0.3–1.2)
Total Protein: 6.7 g/dL (ref 6.5–8.1)

## 2022-09-16 LAB — CBC WITH DIFFERENTIAL/PLATELET
Abs Immature Granulocytes: 0.03 10*3/uL (ref 0.00–0.07)
Basophils Absolute: 0.1 10*3/uL (ref 0.0–0.1)
Basophils Relative: 1 %
Eosinophils Absolute: 0 10*3/uL (ref 0.0–0.5)
Eosinophils Relative: 1 %
HCT: 43.7 % (ref 36.0–46.0)
Hemoglobin: 13.9 g/dL (ref 12.0–15.0)
Immature Granulocytes: 0 %
Lymphocytes Relative: 13 %
Lymphs Abs: 0.9 10*3/uL (ref 0.7–4.0)
MCH: 28.4 pg (ref 26.0–34.0)
MCHC: 31.8 g/dL (ref 30.0–36.0)
MCV: 89.2 fL (ref 80.0–100.0)
Monocytes Absolute: 0.4 10*3/uL (ref 0.1–1.0)
Monocytes Relative: 5 %
Neutro Abs: 5.7 10*3/uL (ref 1.7–7.7)
Neutrophils Relative %: 80 %
Platelets: 282 10*3/uL (ref 150–400)
RBC: 4.9 MIL/uL (ref 3.87–5.11)
RDW: 13.7 % (ref 11.5–15.5)
WBC: 7.2 10*3/uL (ref 4.0–10.5)
nRBC: 0 % (ref 0.0–0.2)

## 2022-09-16 LAB — URINALYSIS, ROUTINE W REFLEX MICROSCOPIC
Bilirubin Urine: NEGATIVE
Glucose, UA: NEGATIVE mg/dL
Hgb urine dipstick: NEGATIVE
Ketones, ur: NEGATIVE mg/dL
Leukocytes,Ua: NEGATIVE
Nitrite: NEGATIVE
Protein, ur: NEGATIVE mg/dL
Specific Gravity, Urine: 1.005 (ref 1.005–1.030)
pH: 6 (ref 5.0–8.0)

## 2022-09-16 LAB — RESP PANEL BY RT-PCR (RSV, FLU A&B, COVID)  RVPGX2
Influenza A by PCR: NEGATIVE
Influenza B by PCR: NEGATIVE
Resp Syncytial Virus by PCR: POSITIVE — AB
SARS Coronavirus 2 by RT PCR: NEGATIVE

## 2022-09-16 MED ORDER — ONDANSETRON HCL 4 MG PO TABS: 4.0000 mg | ORAL_TABLET | Freq: Four times a day (QID) | ORAL | 0 refills | Status: AC

## 2022-09-16 MED ORDER — ONDANSETRON HCL 4 MG PO TABS: 4.0000 mg | ORAL_TABLET | Freq: Four times a day (QID) | ORAL | 0 refills | Status: DC

## 2022-09-16 NOTE — ED Provider Notes (Signed)
   Clinical Course as of 09/16/22 1723  Fri Sep 16, 2022  1516 DG Chest 2 View FINDINGS: Cardiac silhouette is prominent. There is pulmonary interstitial prominence with vascular congestion. No focal consolidation. No pneumothorax or pleural effusion identified. Aorta is calcified. There is a moderate-sized hiatal   IMPRESSION: Findings suggest CHF.  [HN]  1516 Respiratory Syncytial Virus by PCR(!): POSITIVE [HN]  1516 Sodium(!): 128 New hyponatremia. Has had one low sodium in 2018 at 123 during an admission for esophageal obstruction but not documented since then.  [HN]  1516 Creatinine: 0.73 [HN]  1516 BUN: 14 [HN]  1516 CBC with Differential wnl [HN]  1622 Received sign out pending UA and PVR. Tolerated PO and fluids. Mild hyponatremia likely due to mild dehydration [WS]  1719 UA negative. Will discharge patient to home. All questions answered. Patient comfortable with plan of discharge. Return precautions discussed with patient and specified on the after visit summary.  [WS]    Clinical Course User Index [HN] Loetta Rough, MD [WS] Lonell Grandchild, MD   Medical Decision Making Amount and/or Complexity of Data Reviewed Labs: ordered. Decision-making details documented in ED Course. Radiology:  Decision-making details documented in ED Course.  Risk Prescription drug management.         Lonell Grandchild, MD 09/16/22 1723

## 2022-09-16 NOTE — ED Notes (Signed)
Pts O2 95% while sitting. Pts O2 92-95% while ambulating.

## 2022-09-16 NOTE — ED Triage Notes (Signed)
Pt BIB [facility] from home c/o cough and nasal congestion x1 week.

## 2022-09-16 NOTE — ED Notes (Signed)
Post void .

## 2022-09-16 NOTE — ED Provider Triage Note (Signed)
Emergency Medicine Provider Triage Evaluation Note  Laurie Pugh , a 88 y.o. female  was evaluated in triage.  Pt complains of several days of congestion and sore throat.  Denies any known sick contacts but states that she does have several children's completo checkup on her.  She denies going to any stores or gatherings of people recently.  States that sore throat is for symptom that began without development of some congestion.  Denies any chest pain, chest pressure, severe shortness of breath, abdominal pain, nausea, vomiting, diarrhea.  Review of Systems  Positive: See above Negative: See above  Physical Exam  BP 135/87 (BP Location: Right Arm)   Pulse 78   Temp 97.6 F (36.4 C)   Resp (!) 22   Ht 5\' 3"  (1.6 m)   Wt 42.2 kg   SpO2 91%   BMI 16.47 kg/m  Gen:   Awake, no distress Resp:  Normal effort, wheezing in lung apices MSK:   Moves extremities without difficulty Other:    Medical Decision Making  Medically screening exam initiated at 9:50 AM.  Appropriate orders placed.  KYNDALL AMERO was informed that the remainder of the evaluation will be completed by another provider, this initial triage assessment does not replace that evaluation, and the importance of remaining in the ED until their evaluation is complete.     Joen Laura, PA-C 09/16/22 [phone]

## 2022-09-16 NOTE — ED Notes (Signed)
Patient verbalizes understanding of discharge instructions. Opportunity for questioning and answers were provided. Armband removed by staff, pt discharged from ED. Pt taken to ED waiting room via wheel chair.  

## 2022-09-16 NOTE — ED Provider Notes (Signed)
[facility] EMERGENCY DEPARTMENT Provider Note   CSN: [id number] Arrival date & time: 09/16/22  0930     History  Chief Complaint  Patient presents with   Cough   Nasal Congestion    Laurie Pugh is a 88 y.o. female with CHF, paroxysmal SVT, HTN, HLD, A-fib on Eliquis, history of esophageal obstruction, history of SDH, carotid artery stenosis, osteoporosis who presents with cough, nasal congestion.   Patient presents with her daughter who provides additional history.   Patient endorses approximately 5 days of nasal congestion, rhinorrhea, dry cough as well as some nausea and decreased p.o. intake. Denies CP, SOB, lightheadedness, falls, abdominal pain. Has had fevers/chills as well.  Also had a few days of feeling like she is incompletely emptying her bladder with no associated dysuria or hematuria. No h/o similar.  No back pain, no falls.   Cough      Home Medications Prior to Admission medications   Medication Sig Start Date End Date Taking? Authorizing Provider  acetaminophen (TYLENOL) 325 MG tablet Take 650 mg by mouth every 4 (four) hours as needed for moderate pain, mild pain or headache.    [provider]  Ernestina Patches 62.5-25 MCG/ACT AEPB Inhale 1 puff into the lungs daily. 10/22/21   [provider]  apixaban (ELIQUIS) 5 MG TABS tablet Take 2.5 mg by mouth 2 (two) times daily.    [provider]  barrier cream (NON-SPECIFIED) CREA Apply 1 application topically 2 (two) times daily as needed (buttocks).    [provider]  fluticasone (FLONASE) 50 MCG/ACT nasal spray Place 1 spray into both nostrils at bedtime.    [provider]  furosemide (LASIX) 40 MG tablet Take 0.5 tablets (20 mg total) by mouth daily. 11/08/21   Clegg, Amy D, NP  metoprolol tartrate (LOPRESSOR) 50 MG tablet Take 1 tablet (50 mg total) by mouth 2 (two) times daily. 10/29/21   Haydee Monica, MD  omeprazole (PRILOSEC) 40 MG capsule  TAKE 1 CAPSULE BY MOUTH  DAILY Patient taking differently: Take 40 mg by mouth daily. 01/14/21   Unk Lightning, PA  oxyCODONE-acetaminophen (PERCOCET/ROXICET) 5-325 MG tablet Take 1 tablet by mouth every 6 (six) hours as needed (pain).  02/06/19   [provider]  polyvinyl alcohol (LIQUIFILM TEARS) 1.4 % ophthalmic solution Place 1 drop into both eyes as needed for dry eyes. 03/28/21   Swayze, Ava, DO  rosuvastatin (CRESTOR) 10 MG tablet Take 10 mg by mouth every evening.    [provider]      Allergies    Bupropion, Metoprolol, Codeine, and Erythromycin    Review of Systems   Review of Systems  Respiratory:  Positive for cough.    Review of systems Positive for f/c.  A 10 point review of systems was performed and is negative unless otherwise reported in HPI.  Physical Exam Updated Vital Signs BP 135/87 (BP Location: Right Arm)   Pulse 78   Temp 97.6 F (36.4 C)   Resp (!) 22   Ht 5\' 3"  (1.6 m)   Wt 42.2 kg   SpO2 91%   BMI 16.47 kg/m  Physical Exam General: Normal appearing elderly female, lying in bed.  HEENT: , Sclera anicteric, MMM, trachea midline.  Cardiology: RRR, no murmurs/rubs/gallops. BL radial and DP pulses equal bilaterally.  Resp: Normal respiratory rate and effort. CTAB, no wheezes, rhonchi, crackles.  Abd: Soft, non-tender, non-distended. No rebound tenderness or guarding.  GU: Deferred. MSK:  No peripheral edema or signs of trauma. Extremities without deformity or TTP. No cyanosis or clubbing. Skin: warm, dry.  Neuro: A&Ox4, CNs II-XII grossly intact. MAEs. Sensation grossly intact.  Psych: Normal mood and affect.   ED Results / Procedures / Treatments   Labs (all labs ordered are listed, but only abnormal results are displayed) Labs Reviewed  RESP PANEL BY RT-PCR (RSV, FLU A&B, COVID)  RVPGX2 - Abnormal; Notable for the following components:      Result Value   Resp Syncytial Virus by PCR POSITIVE (*)    All other components  within normal limits  COMPREHENSIVE METABOLIC PANEL - Abnormal; Notable for the following components:   Sodium 128 (*)    Chloride 96 (*)    Glucose, Bld 106 (*)    All other components within normal limits  CBC WITH DIFFERENTIAL/PLATELET    EKG None  Radiology DG Chest 2 View  Result Date: 09/16/2022 CLINICAL DATA:  Increasing SOB, wheezing EXAM: Chest two views COMPARISON:  10/29/2021 FINDINGS: Cardiac silhouette is prominent. There is pulmonary interstitial prominence with vascular congestion. No focal consolidation. No pneumothorax or pleural effusion identified. Aorta is calcified. There is a moderate-sized hiatal IMPRESSION: Findings suggest CHF. Electronically Signed   By: Layla Maw M.D.   On: 09/16/2022 10:33    Procedures Procedures    Medications Ordered in ED Medications - No data to display  ED Course/ Medical Decision Making/ A&P                          Medical Decision Making Amount and/or Complexity of Data Reviewed Labs: ordered. Decision-making details documented in ED Course. Radiology:  Decision-making details documented in ED Course.    MDM:   Patient overall is well-appearing at this time and is ambulatory around the department, now sitting in a chair with her daughter beside her chatting casually. No respiratory distress, no wheezing/crackles/rales on auscultation. She is HDS.  Patient presents with signs and symptoms of a possible viral infection such as COVID flu RSV and will obtain swabs to test.  Given her cough and reported fever at home we will also consider pneumonia and will obtain a chest x-ray, also evaluate for pulmonary edema or pleural effusion though she has no report of increased swelling.  Consider blood abnormalities or renal injury given report of decreased p.o. intake.  She does not have any nausea at this time and is taking p.o. well.  She also reports a feeling of incompletely emptying her bladder but has no abdominal pain or  flank pain.  No urinary symptoms other than this.  Consider UTI, urinary obstruction, or urinary retention.  She has no back pain has had no falls.  Will obtain a urine and a postvoid residual to evaluate for possible urinary retention.    Clinical Course as of 09/20/22 1941  Fri Sep 16, 2022  1516 DG Chest 2 View FINDINGS: Cardiac silhouette is prominent. There is pulmonary interstitial prominence with vascular congestion. No focal consolidation. No pneumothorax or pleural effusion identified. Aorta is calcified. There is a moderate-sized hiatal   IMPRESSION: Findings suggest CHF.  [HN]  1516 Respiratory Syncytial Virus by PCR(!): POSITIVE [HN]  1516 Sodium(!): 128 New hyponatremia. Has had one low sodium in 2018 at 123 during an admission for esophageal obstruction but not documented since then.  [HN]  1516 Creatinine: 0.73 [HN]  1516 BUN: 14 [HN]  1516 CBC with Differential wnl [HN]  1622 Received  sign out pending UA and PVR. Tolerated PO and fluids. Mild hyponatremia likely due to mild dehydration [WS]  1719 UA negative. Will discharge patient to home. All questions answered. Patient comfortable with plan of discharge. Return precautions discussed with patient and specified on the after visit summary.  [WS]    Clinical Course User Index [HN] Loetta Rough, MD [WS] Lonell Grandchild, MD     Labs: I Ordered, and personally interpreted labs.  The pertinent results include: Those listed above  Imaging Studies ordered: I ordered imaging studies including CXR I independently visualized and interpreted imaging. I agree with the radiologist interpretation  Additional history obtained from daughter at bedside  Social Determinants of Health: Patient lives independently in her own home  Disposition:    Patient with new hyponatremia to 128.  This is likely due to mild dehydration as a result of decreased p.o. intake.  Patient is able to take p.o. and is hydrating well  currently here in the ED, I have given her saltine crackers and water to drink.  I will give patient a Zofran prescription to ensure that she can take p.o. at home but she otherwise feels okay at this time and I do not believe that she requires admission for IV fluids.  Patient is signed out to the oncoming ED physician who is made aware of her history, presentation, exam, workup, and plan.   Plan is to obtain UA and post-void residual given report of paitnet feeling like she is not emptying completely to r/o obstruction/retention. From a respiratory standpoint patient is safe to be discharged home; she has no respiratory distress, is stable on room air, and her O2 sat with ambulation maintained above 92% without any difficulty.     Co morbidities that complicate the patient evaluation  Past Medical History:  Diagnosis Date   Allergy    Angina pectoris, unstable (HCC)    Cataract    Dyslipidemia    Esophageal stricture    GERD (gastroesophageal reflux disease)    Hyperlipidemia    Hypertension    Neck pain    PAF (paroxysmal atrial fibrillation) (HCC)    PSVT (paroxysmal supraventricular tachycardia)      Medicines Meds ordered this encounter  Medications   DISCONTD: ondansetron (ZOFRAN) 4 MG tablet    Sig: Take 1 tablet (4 mg total) by mouth every 6 (six) hours.    Dispense:  12 tablet    Refill:  0   ondansetron (ZOFRAN) 4 MG tablet    Sig: Take 1 tablet (4 mg total) by mouth every 6 (six) hours.    Dispense:  12 tablet    Refill:  0    I have reviewed the patients home medicines and have made adjustments as needed  Problem List / ED Course: Problem List Items Addressed This Visit   None Visit Diagnoses     RSV infection    -  Primary   Hyponatremia                      This note was created using dictation software, which may contain spelling or grammatical errors.    Loetta Rough, MD 09/21/22 [phone]

## 2022-09-26 DEATH — deceased

## 2023-12-04 IMAGING — DX DG CHEST 2V
2 series · 2 of 2 positions shown · non-contrast
Comparison: Chest radiograph 06/24/2021

CLINICAL DATA: Shortness of breath

EXAM:
CHEST - 2 VIEW

[chest lat]
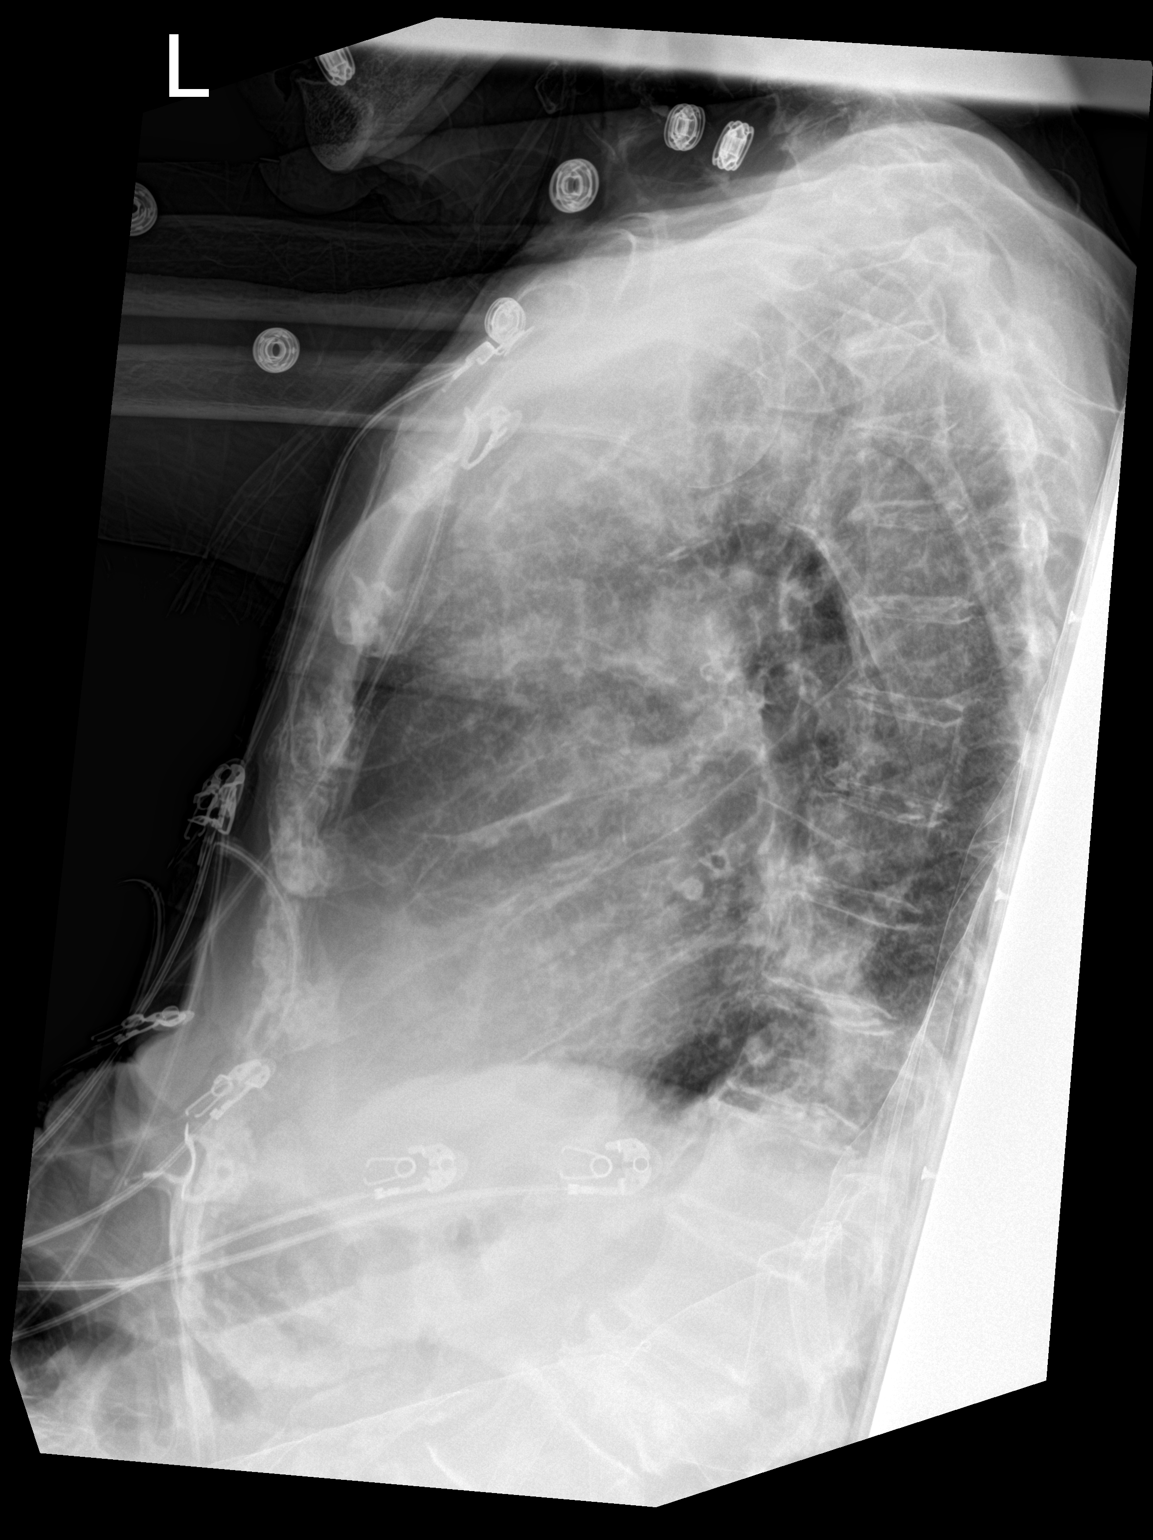

[chest ap]
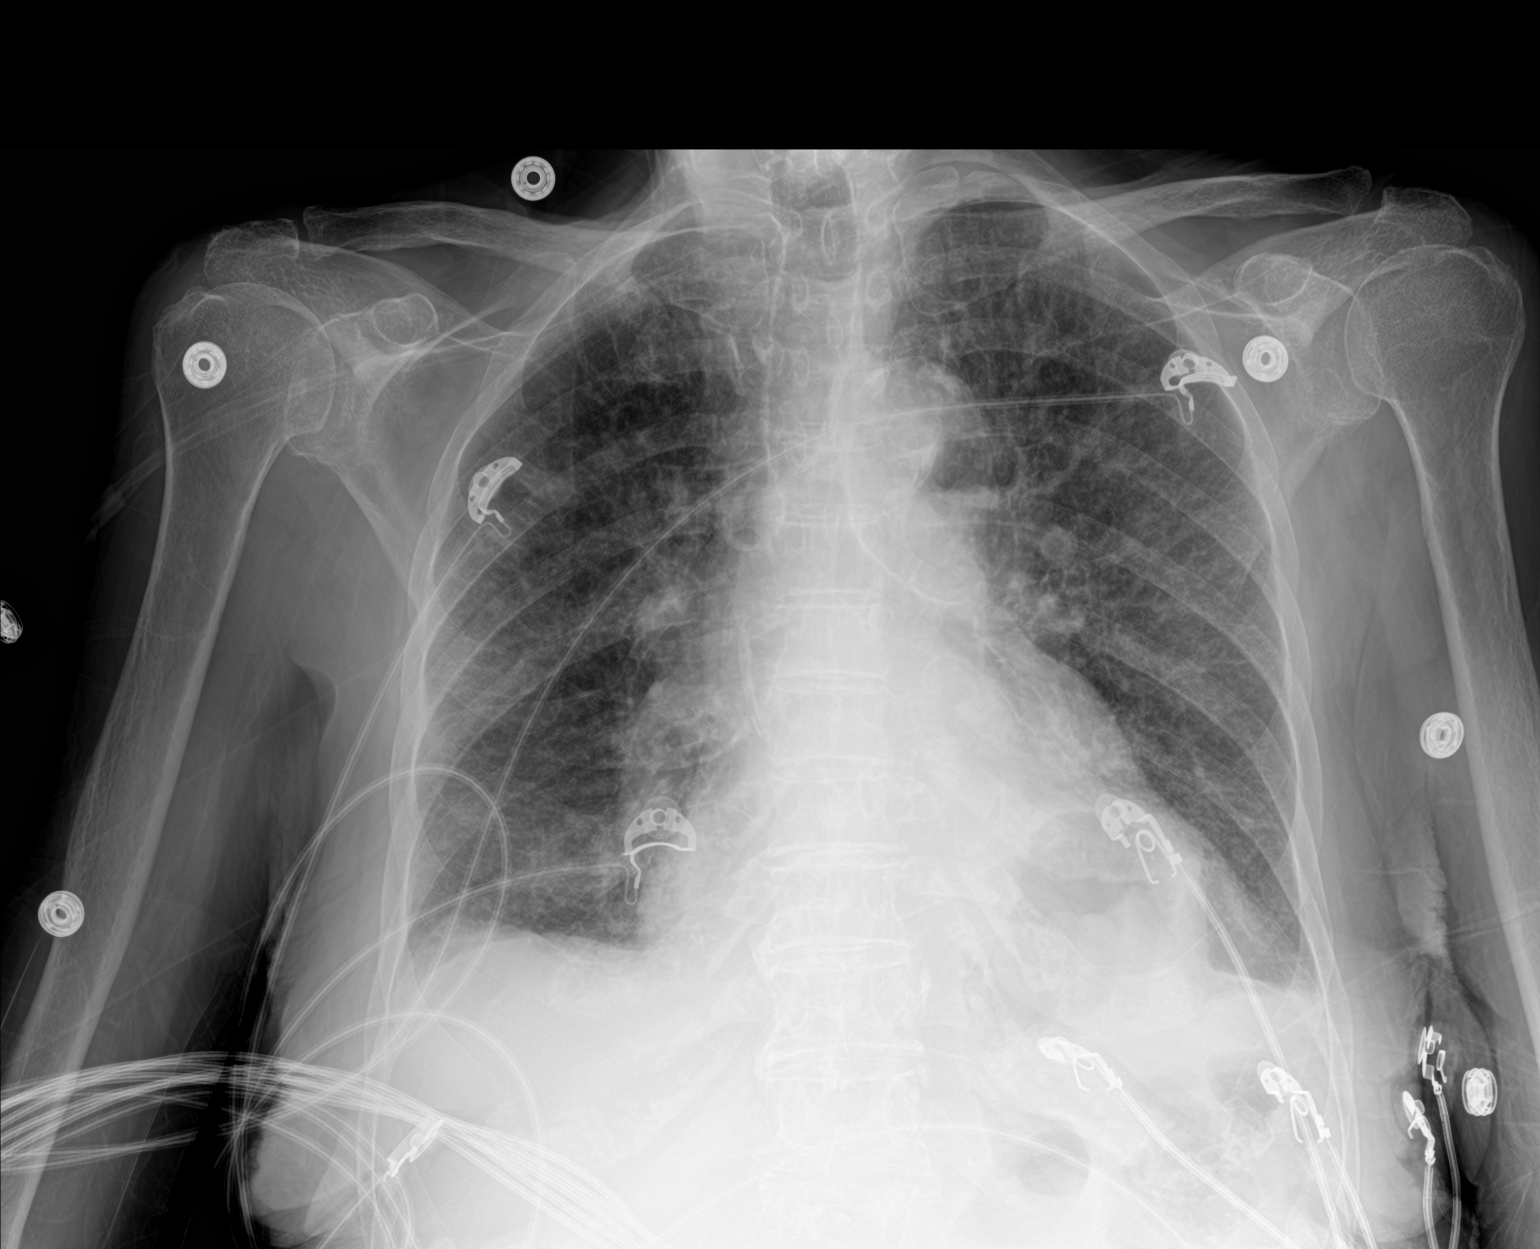

[2 of 2 positions shown; findings below may reference images not displayed]

FINDINGS: The heart is mildly enlarged, unchanged. The upper mediastinal
contours are stable.

There are coarsened interstitial markings bilaterally, similar to
the prior study and likely chronic. There is patchy opacity in the
right lower lobe not present on the prior study. There is no
significant pleural effusion. There is no pneumothorax.

There is a moderate size hiatal hernia, unchanged.

There is no acute osseous abnormality. There is unchanged
compression deformity of an upper lumbar vertebral body.
IMPRESSION: 1. Patchy opacity in the right lower lobe not present on the prior
study could reflect infection or aspiration.
2. Moderate-sized hiatal hernia.

## 2023-12-07 IMAGING — DX DG CHEST 1V PORT
1 series · 1 of 1 positions shown · non-contrast
Comparison: Chest x-ray 10/26/2021

CLINICAL DATA: CHF

EXAM:
PORTABLE CHEST 1 VIEW

[chest ap]
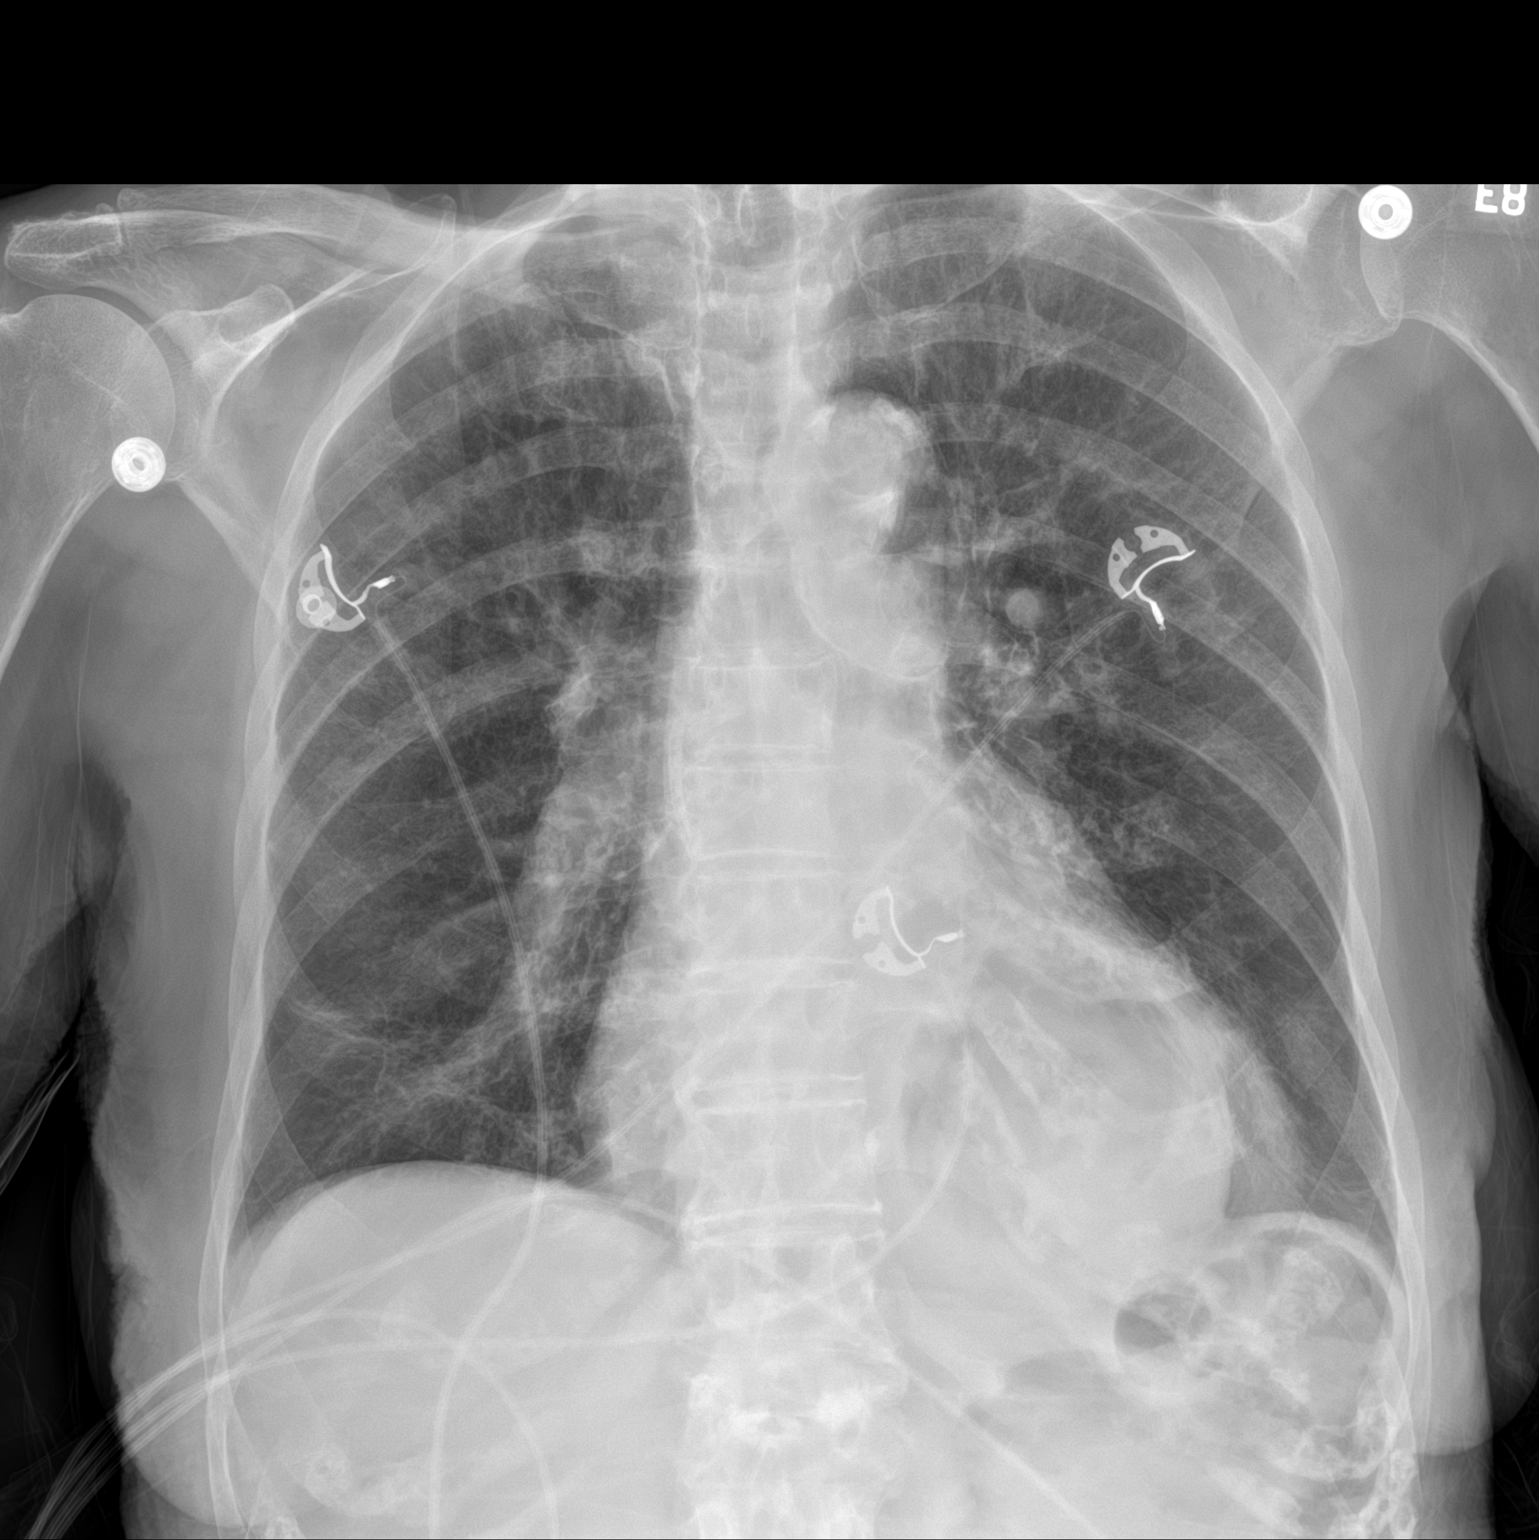

[1 of 1 positions shown; findings below may reference images not displayed]

FINDINGS: Heart is mildly enlarged. Mediastinum appears stable. Calcified
plaques in the aortic arch. Retrocardiac opacity and lucency
consistent with hiatal hernia. Pulmonary vasculature is within
normal limits. Similar chronic prominent interstitial lung markings
bilaterally with no focal consolidation identified. No pleural
effusion or pneumothorax visualized.
IMPRESSION: Chronic changes with no acute process identified.
